# Patient Record
Sex: Male | Born: 1955
Health system: Southern US, Community
[De-identification: ages and names within clinical notes are randomized; demographics above are authoritative.]

## PROBLEM LIST (undated history)

## (undated) DIAGNOSIS — IMO0002 Reserved for concepts with insufficient information to code with codable children: Secondary | ICD-10-CM

## (undated) DIAGNOSIS — I10 Essential (primary) hypertension: Secondary | ICD-10-CM

## (undated) DIAGNOSIS — F419 Anxiety disorder, unspecified: Secondary | ICD-10-CM

## (undated) DIAGNOSIS — M722 Plantar fascial fibromatosis: Secondary | ICD-10-CM

## (undated) DIAGNOSIS — F329 Major depressive disorder, single episode, unspecified: Secondary | ICD-10-CM

## (undated) DIAGNOSIS — K219 Gastro-esophageal reflux disease without esophagitis: Secondary | ICD-10-CM

## (undated) DIAGNOSIS — G43909 Migraine, unspecified, not intractable, without status migrainosus: Secondary | ICD-10-CM

## (undated) DIAGNOSIS — E119 Type 2 diabetes mellitus without complications: Secondary | ICD-10-CM

## (undated) DIAGNOSIS — F32A Depression, unspecified: Secondary | ICD-10-CM

## (undated) DIAGNOSIS — Z9889 Other specified postprocedural states: Secondary | ICD-10-CM

## (undated) DIAGNOSIS — IMO0001 Reserved for inherently not codable concepts without codable children: Secondary | ICD-10-CM

## (undated) DIAGNOSIS — R011 Cardiac murmur, unspecified: Secondary | ICD-10-CM

## (undated) DIAGNOSIS — G8929 Other chronic pain: Secondary | ICD-10-CM

## (undated) DIAGNOSIS — M797 Fibromyalgia: Secondary | ICD-10-CM

## (undated) DIAGNOSIS — R112 Nausea with vomiting, unspecified: Secondary | ICD-10-CM

## (undated) HISTORY — DX: Migraine, unspecified, not intractable, without status migrainosus: G43.909

## (undated) HISTORY — DX: Fibromyalgia: M79.7

## (undated) HISTORY — DX: Reserved for inherently not codable concepts without codable children: IMO0001

## (undated) HISTORY — DX: Gastro-esophageal reflux disease without esophagitis: K21.9

## (undated) HISTORY — PX: CERVICAL FUSION: SHX112

## (undated) HISTORY — PX: OTHER SURGICAL HISTORY: SHX169

## (undated) HISTORY — DX: Other chronic pain: G89.29

## (undated) HISTORY — DX: Reserved for concepts with insufficient information to code with codable children: IMO0002

## (undated) HISTORY — PX: NOSE SURGERY: SHX723

## (undated) HISTORY — DX: Plantar fascial fibromatosis: M72.2

---

## 1997-12-28 ENCOUNTER — Ambulatory Visit (HOSPITAL_COMMUNITY): Admission: RE | Admit: 1997-12-28 | Discharge: 1997-12-28 | Payer: Self-pay | Admitting: Neurosurgery

## 2004-02-06 ENCOUNTER — Ambulatory Visit (HOSPITAL_COMMUNITY): Admission: RE | Admit: 2004-02-06 | Discharge: 2004-02-06 | Payer: Self-pay | Admitting: Family Medicine

## 2004-02-12 ENCOUNTER — Encounter (HOSPITAL_COMMUNITY): Admission: RE | Admit: 2004-02-12 | Discharge: 2004-03-13 | Payer: Self-pay | Admitting: Family Medicine

## 2004-03-17 ENCOUNTER — Encounter (HOSPITAL_COMMUNITY): Admission: RE | Admit: 2004-03-17 | Discharge: 2004-04-03 | Payer: Self-pay | Admitting: Family Medicine

## 2004-04-28 ENCOUNTER — Encounter (HOSPITAL_COMMUNITY): Admission: RE | Admit: 2004-04-28 | Discharge: 2004-05-28 | Payer: Self-pay | Admitting: Family Medicine

## 2004-10-29 ENCOUNTER — Ambulatory Visit: Payer: Self-pay | Admitting: Orthopedic Surgery

## 2004-11-20 ENCOUNTER — Ambulatory Visit: Payer: Self-pay | Admitting: Orthopedic Surgery

## 2004-11-20 ENCOUNTER — Ambulatory Visit (HOSPITAL_COMMUNITY): Admission: RE | Admit: 2004-11-20 | Discharge: 2004-11-20 | Payer: Self-pay | Admitting: Orthopedic Surgery

## 2004-11-23 ENCOUNTER — Ambulatory Visit: Payer: Self-pay | Admitting: Orthopedic Surgery

## 2004-11-24 ENCOUNTER — Encounter (HOSPITAL_COMMUNITY): Admission: RE | Admit: 2004-11-24 | Discharge: 2004-12-24 | Payer: Self-pay | Admitting: Orthopedic Surgery

## 2004-12-01 ENCOUNTER — Ambulatory Visit: Payer: Self-pay | Admitting: Orthopedic Surgery

## 2004-12-21 ENCOUNTER — Ambulatory Visit: Payer: Self-pay | Admitting: Orthopedic Surgery

## 2005-02-22 ENCOUNTER — Ambulatory Visit: Payer: Self-pay | Admitting: Orthopedic Surgery

## 2005-03-24 ENCOUNTER — Ambulatory Visit: Payer: Self-pay | Admitting: Orthopedic Surgery

## 2005-03-26 ENCOUNTER — Ambulatory Visit (HOSPITAL_COMMUNITY): Admission: RE | Admit: 2005-03-26 | Discharge: 2005-03-26 | Payer: Self-pay | Admitting: Orthopedic Surgery

## 2005-03-31 ENCOUNTER — Ambulatory Visit: Payer: Self-pay | Admitting: Orthopedic Surgery

## 2005-05-24 ENCOUNTER — Ambulatory Visit (HOSPITAL_COMMUNITY): Admission: RE | Admit: 2005-05-24 | Discharge: 2005-05-24 | Payer: Self-pay | Admitting: Neurosurgery

## 2007-07-28 ENCOUNTER — Ambulatory Visit (HOSPITAL_COMMUNITY): Admission: RE | Admit: 2007-07-28 | Discharge: 2007-07-28 | Payer: Self-pay | Admitting: Family Medicine

## 2010-11-20 NOTE — Op Note (Signed)
NAME:  Vernon Fisher, Vernon Fisher                ACCOUNT NO.:  [id number]   MEDICAL RECORD NO.:  [id number]          PATIENT TYPE:  AMB   LOCATION:  DAY                           FACILITY:  [facility]   PHYSICIAN:  Vickki Hearing, M.D.DATE OF BIRTH:  [birth year]   DATE OF PROCEDURE:  11/20/2004  DATE OF DISCHARGE:                                 OPERATIVE REPORT   PREOPERATIVE DIAGNOSIS:  Osteoarthritis, AC joint, impingement syndrome,  right shoulder.   POSTOPERATIVE DIAGNOSIS:  Osteoarthritis, AC joint, impingement syndrome,  right shoulder.   PROCEDURE:  Arthroscopy, right shoulder, surgical, subacromial decompression  then opened claviculectomy partial/Mumford procedure.   SURGEON:  Dr. Romeo Apple.   ASSISTANT:  1.  Melvin.   ANESTHETIC:  General.   FINDINGS:  Bursitis, large acromioclavicular spur.   PRIMARY INDICATION:  Persistent pain which failed nonoperative treatment.   The patient was identified preop holding area. His right shoulder was marked  as the surgical site and countersigned by the surgeon. He was given his  antibiotic. History and physical was updated. He was taken to the operating  room where general anesthesia was administered. In the lateral position with  axillary roll and padding on the lower extremity, his right arm was placed  in a shoulder holder, and 10 pounds of traction was placed on the arm. His  arm was prepped and draped using sterile technique. A time-out was taken and  completed.   From the posterior portal, arthroscopy was done. Scope was introduced  posteriorly. A diagnostic arthroscopy was performed. Motorized shaver was  placed intra-articularly to the clean up some mild fraying of his labrum.  The cuff was intact. Glenohumeral joint surfaces were normal. There were no  axillary loose bodies.   The scope was placed in the subacromial space and acromioplasty was  performed. Bursectomy was performed. Rotator cuff looked normal.   We opened the [facility]  joint using a transverse incision over the joint, divided  the subcu down to the clavicle fascia, split the fascia, preserved it, took  out 8 mm of bone with a saw, irrigated the wound, placed bone wax over the  end  of the bone and then closed the periosteum and subcu tissue with 2-0  Monocryl, placed a pain pump catheter in the subacromial space, activated  it, closed the wound with staples. Sterile dressings were applied. CryoCuff  was applied. The patient was extubated and taken to recovery room in stable  condition.      SEH/MEDQ  D:  11/20/2004  T:  11/21/2004  Job:  [id number]
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
## 2011-05-19 ENCOUNTER — Other Ambulatory Visit: Payer: Self-pay

## 2011-05-19 ENCOUNTER — Telehealth: Payer: Self-pay

## 2011-05-19 DIAGNOSIS — Z139 Encounter for screening, unspecified: Secondary | ICD-10-CM

## 2011-05-19 NOTE — Telephone Encounter (Signed)
Gastroenterology Pre-Procedure Form  Request Date: 05/18/2011      Requesting Physician: Dr. Simone Curia     PATIENT INFORMATION:  Vernon Fisher is a 55 y.o., male (DOB=[birth year]).  PROCEDURE: Procedure(s) requested: colonoscopy Procedure Reason: screening for colon cancer  PATIENT REVIEW QUESTIONS: The patient reports the following:   1. Diabetes Melitis: no 2. Joint replacements in the past 12 months: no 3. Major health problems in the past 3 months: no 4. Has an artificial valve or MVP:no 5. Has been advised in past to take antibiotics in advance of a procedure like teeth cleaning: no}    MEDICATIONS & ALLERGIES:    Patient reports the following regarding taking any blood thinners:   Plavix? no Aspirin?no Coumadin?  no  Patient confirms/reports the following medications:  Current Outpatient Prescriptions  Medication Sig Dispense Refill  . enalapril (VASOTEC) 10 MG tablet Take 10 mg by mouth daily.        . pantoprazole (PROTONIX) 40 MG tablet Take 40 mg by mouth daily.          Patient confirms/reports the following allergies:  No Known Allergies  Patient is appropriate to schedule for requested procedure(s): yes  AUTHORIZATION INFORMATION Primary Insurance:   ID #:   Group #:   Pre-Cert / Auth #:   Secondary Insurance:   ID #: ,  Group #:  Pre-Cert / Auth required:  Pre-Cert / Auth #:   No orders of the defined types were placed in this encounter.    SCHEDULE INFORMATION: Procedure has been scheduled as follows:  Date: 06/08/2011           Time: 11:15 AM  Location: [facility] Short Stay  This Gastroenterology Pre-Precedure Form is being routed to the following provider(s) for review: Jonette Eva, MD

## 2011-05-24 NOTE — Telephone Encounter (Signed)
MOVI PREP SPLIT DOSING. REGULAR BREAKFAST. CLEAR LIQUIDS AFTER 9 AM. 

## 2011-06-01 ENCOUNTER — Encounter (HOSPITAL_COMMUNITY): Payer: Self-pay | Admitting: Pharmacy Technician

## 2011-06-01 NOTE — Telephone Encounter (Signed)
Rx and instructions mailed.  

## 2011-06-07 MED ORDER — SODIUM CHLORIDE 0.45 % IV SOLN
Freq: Once | INTRAVENOUS | Status: AC
  Administered 2011-06-08: 11:00:00 via INTRAVENOUS

## 2011-06-08 ENCOUNTER — Ambulatory Visit (HOSPITAL_COMMUNITY)
Admission: RE | Admit: 2011-06-08 | Discharge: 2011-06-08 | Disposition: A | Discharge status: Home / Self Care | Payer: BC Managed Care – PPO | Source: Ambulatory Visit | Attending: Gastroenterology | Admitting: Gastroenterology

## 2011-06-08 ENCOUNTER — Encounter (HOSPITAL_COMMUNITY)
Admission: RE | Disposition: A | Discharge status: Home / Self Care | Payer: Self-pay | Source: Ambulatory Visit | Attending: Gastroenterology

## 2011-06-08 ENCOUNTER — Encounter (HOSPITAL_COMMUNITY): Payer: Self-pay | Admitting: *Deleted

## 2011-06-08 DIAGNOSIS — I10 Essential (primary) hypertension: Secondary | ICD-10-CM | POA: Insufficient documentation

## 2011-06-08 DIAGNOSIS — Z139 Encounter for screening, unspecified: Secondary | ICD-10-CM

## 2011-06-08 DIAGNOSIS — Z79899 Other long term (current) drug therapy: Secondary | ICD-10-CM | POA: Insufficient documentation

## 2011-06-08 DIAGNOSIS — Z1211 Encounter for screening for malignant neoplasm of colon: Secondary | ICD-10-CM

## 2011-06-08 HISTORY — PX: COLONOSCOPY: SHX5424

## 2011-06-08 HISTORY — DX: Essential (primary) hypertension: I10

## 2011-06-08 HISTORY — DX: Gastro-esophageal reflux disease without esophagitis: K21.9

## 2011-06-08 SURGERY — COLONOSCOPY
Anesthesia: Moderate Sedation

## 2011-06-08 MED ORDER — MIDAZOLAM HCL 5 MG/5ML IJ SOLN
INTRAMUSCULAR | Status: AC
  Filled 2011-06-08: qty 10

## 2011-06-08 MED ORDER — STERILE WATER FOR IRRIGATION IR SOLN
Status: DC | PRN
  Administered 2011-06-08: 11:00:00

## 2011-06-08 MED ORDER — MEPERIDINE HCL 100 MG/ML IJ SOLN
INTRAMUSCULAR | Status: AC
  Filled 2011-06-08: qty 2

## 2011-06-08 MED ORDER — MIDAZOLAM HCL 5 MG/5ML IJ SOLN
INTRAMUSCULAR | Status: DC | PRN
  Administered 2011-06-08 (×2): 2 mg via INTRAVENOUS

## 2011-06-08 MED ORDER — MEPERIDINE HCL 100 MG/ML IJ SOLN
INTRAMUSCULAR | Status: DC | PRN
  Administered 2011-06-08: 50 mg via INTRAVENOUS
  Administered 2011-06-08: 25 mg via INTRAVENOUS

## 2011-06-08 NOTE — H&P (Signed)
  Primary Care Physician:  Harlow Asa, MD, MD Primary Gastroenterologist:  Dr. [name]  Pre-Procedure History & Physical: HPI:  Vernon Fisher is a 55 y.o. male here for AVERAGE RISK SCREENING.  Past Medical History  Diagnosis Date  . GERD (gastroesophageal reflux disease)   . Hypertension     Past Surgical History  Procedure Date  . Bilateral carpal tunnel release   . Right rotator cuff repair   . Nose surgery     Prior to Admission medications   Medication Sig Start Date End Date Taking? Authorizing Provider  enalapril (VASOTEC) 10 MG tablet Take 10 mg by mouth daily.     Yes Historical Provider, MD  pantoprazole (PROTONIX) 40 MG tablet Take 40 mg by mouth daily.     Yes Historical Provider, MD    Allergies as of 05/19/2011  . (No Known Allergies)    Family History  Problem Relation Age of Onset  . Colon cancer Neg Hx     History   Social History  . Marital Status: Married    Spouse Name: N/A    Number of Children: N/A  . Years of Education: N/A   Occupational History  . Not on file.   Social History Main Topics  . Smoking status: Former Smoker -- 1.0 packs/day for 10 years  . Smokeless tobacco: Not on file  . Alcohol Use: No  . Drug Use: No  . Sexually Active:    Other Topics Concern  . Not on file   Social History Narrative  . No narrative on file    Review of Systems: See HPI, otherwise negative ROS   Physical Exam: BP 130/81  Pulse 66  Temp(Src) 98.5 F (36.9 C) (Oral)  Resp 17  Ht 5\' 11"  (1.803 m)  Wt 218 lb (98.884 kg)  BMI 30.40 kg/m2  SpO2 96% General:   Alert,  pleasant and cooperative in NAD Head:  Normocephalic and atraumatic. Neck:  Supple; no masses or thyromegaly. Lungs:  Clear throughout to auscultation.    Heart:  Regular rate and rhythm. Abdomen:  Soft, nontender and nondistended. Normal bowel sounds, without guarding, and without rebound.   Neurologic:  Alert and  oriented x4;  grossly normal  neurologically.  Impression/Plan:    AVERAGE RISK  PLAN: TCS TODAY

## 2011-06-09 ENCOUNTER — Other Ambulatory Visit: Payer: Self-pay | Admitting: Family Medicine

## 2011-06-09 DIAGNOSIS — M549 Dorsalgia, unspecified: Secondary | ICD-10-CM

## 2011-06-14 ENCOUNTER — Ambulatory Visit (HOSPITAL_COMMUNITY)
Admission: RE | Admit: 2011-06-14 | Discharge: 2011-06-14 | Disposition: A | Discharge status: Home / Self Care | Payer: BC Managed Care – PPO | Source: Ambulatory Visit | Attending: Family Medicine | Admitting: Family Medicine

## 2011-06-14 ENCOUNTER — Other Ambulatory Visit: Payer: Self-pay | Admitting: Family Medicine

## 2011-06-14 DIAGNOSIS — M79609 Pain in unspecified limb: Secondary | ICD-10-CM | POA: Insufficient documentation

## 2011-06-14 DIAGNOSIS — M5126 Other intervertebral disc displacement, lumbar region: Secondary | ICD-10-CM | POA: Insufficient documentation

## 2011-06-14 DIAGNOSIS — M549 Dorsalgia, unspecified: Secondary | ICD-10-CM

## 2011-06-14 DIAGNOSIS — Z1389 Encounter for screening for other disorder: Secondary | ICD-10-CM

## 2011-06-14 DIAGNOSIS — M545 Low back pain, unspecified: Secondary | ICD-10-CM | POA: Insufficient documentation

## 2011-06-14 DIAGNOSIS — M5124 Other intervertebral disc displacement, thoracic region: Secondary | ICD-10-CM | POA: Insufficient documentation

## 2011-06-17 ENCOUNTER — Encounter (HOSPITAL_COMMUNITY): Payer: Self-pay | Admitting: Gastroenterology

## 2011-11-04 ENCOUNTER — Other Ambulatory Visit: Payer: Self-pay | Admitting: Family Medicine

## 2011-11-04 ENCOUNTER — Ambulatory Visit (HOSPITAL_COMMUNITY)
Admission: RE | Admit: 2011-11-04 | Discharge: 2011-11-04 | Disposition: A | Discharge status: Home / Self Care | Payer: BC Managed Care – PPO | Source: Ambulatory Visit | Attending: Family Medicine | Admitting: Family Medicine

## 2011-11-04 DIAGNOSIS — M79641 Pain in right hand: Secondary | ICD-10-CM

## 2011-11-04 DIAGNOSIS — M79609 Pain in unspecified limb: Secondary | ICD-10-CM | POA: Insufficient documentation

## 2012-06-29 ENCOUNTER — Emergency Department (HOSPITAL_COMMUNITY)
Admission: EM | Admit: 2012-06-29 | Discharge: 2012-06-29 | Disposition: A | Discharge status: Home / Self Care | Payer: BC Managed Care – PPO | Attending: Emergency Medicine | Admitting: Emergency Medicine

## 2012-06-29 ENCOUNTER — Emergency Department (HOSPITAL_COMMUNITY): Payer: BC Managed Care – PPO

## 2012-06-29 ENCOUNTER — Encounter (HOSPITAL_COMMUNITY): Payer: Self-pay

## 2012-06-29 DIAGNOSIS — E119 Type 2 diabetes mellitus without complications: Secondary | ICD-10-CM | POA: Insufficient documentation

## 2012-06-29 DIAGNOSIS — Z87891 Personal history of nicotine dependence: Secondary | ICD-10-CM | POA: Insufficient documentation

## 2012-06-29 DIAGNOSIS — R0602 Shortness of breath: Secondary | ICD-10-CM | POA: Insufficient documentation

## 2012-06-29 DIAGNOSIS — R5381 Other malaise: Secondary | ICD-10-CM | POA: Insufficient documentation

## 2012-06-29 DIAGNOSIS — I10 Essential (primary) hypertension: Secondary | ICD-10-CM | POA: Insufficient documentation

## 2012-06-29 DIAGNOSIS — Z79899 Other long term (current) drug therapy: Secondary | ICD-10-CM | POA: Insufficient documentation

## 2012-06-29 DIAGNOSIS — Z8719 Personal history of other diseases of the digestive system: Secondary | ICD-10-CM | POA: Insufficient documentation

## 2012-06-29 DIAGNOSIS — J189 Pneumonia, unspecified organism: Secondary | ICD-10-CM

## 2012-06-29 DIAGNOSIS — R0789 Other chest pain: Secondary | ICD-10-CM

## 2012-06-29 DIAGNOSIS — R071 Chest pain on breathing: Secondary | ICD-10-CM | POA: Insufficient documentation

## 2012-06-29 HISTORY — DX: Type 2 diabetes mellitus without complications: E11.9

## 2012-06-29 LAB — HEPATIC FUNCTION PANEL
ALT: 50 U/L (ref 0–53)
AST: 28 U/L (ref 0–37)
Albumin: 4 g/dL (ref 3.5–5.2)
Alkaline Phosphatase: 99 U/L (ref 39–117)
Bilirubin, Direct: <0.1 mg/dL (ref 0.0–0.3)
Total Bilirubin: 0.4 mg/dL (ref 0.3–1.2)
Total Protein: 7 g/dL (ref 6.0–8.3)

## 2012-06-29 LAB — BASIC METABOLIC PANEL
Chloride: 100 mEq/L (ref 96–112)
Creatinine, Ser: 0.72 mg/dL (ref 0.50–1.35)
GFR calc Af Amer: >90 mL/min (ref >90)
Potassium: 3.9 mEq/L (ref 3.5–5.1)
Sodium: 137 mEq/L (ref 135–145)

## 2012-06-29 LAB — CBC
Platelets: 161 10*3/uL (ref 150–400)
RDW: 12.8 % (ref 11.5–15.5)
WBC: 7.5 10*3/uL (ref 4.0–10.5)

## 2012-06-29 LAB — PRO B NATRIURETIC PEPTIDE: Pro B Natriuretic peptide (BNP): 5.3 pg/mL (ref 0–125)

## 2012-06-29 LAB — TROPONIN I
Troponin I: <0.3 ng/mL
Troponin I: <0.3 ng/mL

## 2012-06-29 MED ORDER — AZITHROMYCIN 250 MG PO TABS: ORAL_TABLET | ORAL | Status: DC

## 2012-06-29 MED ORDER — OXYCODONE-ACETAMINOPHEN 5-325 MG PO TABS
1.0000 | ORAL_TABLET | Freq: Once | ORAL | Status: AC
  Administered 2012-06-29: 1 via ORAL
  Filled 2012-06-29: qty 1

## 2012-06-29 MED ORDER — HYDROCODONE-ACETAMINOPHEN 5-325 MG PO TABS
1.0000 | ORAL_TABLET | Freq: Once | ORAL | Status: AC
  Administered 2012-06-29: 1 via ORAL
  Filled 2012-06-29: qty 1

## 2012-06-29 NOTE — ED Notes (Signed)
C/o central cp that is dull pressure. Pain worse with exertion. Dizziness and nausea as well.

## 2012-06-29 NOTE — ED Notes (Signed)
Upon attempting discharge pt. Pt raised head and became dizzy, I reattached monitor and noted no change in rate/rhythm. Slowly sat patient up and he collapsed back onto stretcher saying he was going to "pass out", BP and HR elevated slightly no change in heart rhythm. Dr.Zammitt notified and re-eval pt.  Pt non-orthostatic. C/o now headache. Medicated as noted. For CT head now.

## 2012-06-29 NOTE — ED Provider Notes (Addendum)
History    This chart was scribed for Benny Lennert, MD, MD by Smitty Pluck, ED Scribe. The patient was seen in room [facility]19 and the patient's care was started at 11:54 AM.   CSN: [id number]  Arrival date & time 06/29/12  1115    Chief Complaint  Patient presents with  . Chest Pain    (Consider location/radiation/quality/duration/timing/severity/associated sxs/prior treatment) Patient is a 56 y.o. male presenting with chest pain. The history is provided by the patient. No language interpreter was used.  Chest Pain The chest pain began 3 - 5 days ago. Duration of episode(s) is 4 minutes. Chest pain occurs intermittently. The chest pain is unchanged. The pain is associated with exertion. The severity of the pain is moderate. The pain does not radiate. Primary symptoms include fatigue and shortness of breath. Pertinent negatives for primary symptoms include no cough and no abdominal pain.  The shortness of breath developed with exertion.  Pertinent negatives for past medical history include no seizures.    Vernon Fisher is a 56 y.o. male with hx of osteoarthritis, DM, GERD and HTN who presents to the Emergency Department complaining of intermittent, moderate left chest pain onset 6 days ago. Chest pain is aggravated by exertion. He states the episodes of chest last 3-4 minutes. Pt mentions he has diet controlled DM. His last physical exam was 1 year ago. He states he has SOB, fatigue and weird taste in mouth. SOB is aggravated by walking long distances and exertion.   Pt's father passed at 63 from complications from cancer.   Past Medical History  Diagnosis Date  . GERD (gastroesophageal reflux disease)   . Hypertension   . Diabetes mellitus without complication     Past Surgical History  Procedure Date  . Bilateral carpal tunnel release   . Right rotator cuff repair   . Nose surgery   . Colonoscopy 06/08/2011    Procedure: COLONOSCOPY;  Surgeon: [name], MD;  Location: [facility]  ENDO SUITE;  Service: Endoscopy;  Laterality: N/A;  11:15 AM    Family History  Problem Relation Age of Onset  . Colon cancer Neg Hx     History  Substance Use Topics  . Smoking status: Former Smoker -- 1.0 packs/day for 10 years  . Smokeless tobacco: Not on file  . Alcohol Use: No      Review of Systems  Constitutional: Positive for fatigue.  HENT: Negative for congestion, sinus pressure and ear discharge.   Eyes: Negative for discharge.  Respiratory: Positive for shortness of breath. Negative for cough.   Cardiovascular: Positive for chest pain.  Gastrointestinal: Negative for abdominal pain and diarrhea.  Genitourinary: Negative for frequency and hematuria.  Musculoskeletal: Negative for back pain.  Skin: Negative for rash.  Neurological: Negative for seizures and headaches.  Hematological: Negative.   Psychiatric/Behavioral: Negative for hallucinations.  All other systems reviewed and are negative.    Allergies  Latex  Home Medications   Current Outpatient Rx  Name  Route  Sig  Dispense  Refill  . ENALAPRIL MALEATE 10 MG PO TABS   Oral   Take 10 mg by mouth daily.           Marland Kitchen PANTOPRAZOLE SODIUM 40 MG PO TBEC   Oral   Take 40 mg by mouth daily.             BP 126/74  Pulse 61  Temp 97.5 F (36.4 C) (Oral)  Resp 20  Wt 208  lb (94.348 kg)  SpO2 97%  Physical Exam  Nursing note and vitals reviewed. Constitutional: He is oriented to person, place, and time. He appears well-developed.  HENT:  Head: Normocephalic and atraumatic.  Eyes: Conjunctivae normal and EOM are normal. No scleral icterus.  Neck: Neck supple. No thyromegaly present.  Cardiovascular: Normal rate and regular rhythm.  Exam reveals no gallop and no friction rub.   No murmur heard. Pulmonary/Chest: No stridor. He has no wheezes. He has no rales. He exhibits no tenderness.  Abdominal: He exhibits no distension. There is no tenderness. There is no rebound.  Musculoskeletal: Normal  range of motion. He exhibits no edema.  Lymphadenopathy:    He has no cervical adenopathy.  Neurological: He is oriented to person, place, and time. Coordination normal.  Skin: No rash noted. No erythema.  Psychiatric: He has a normal mood and affect. His behavior is normal.    ED Course  Procedures (including critical care time) DIAGNOSTIC STUDIES: Oxygen Saturation is 97% on room air, normal by my interpretation.    COORDINATION OF CARE: 11:59 AM Discussed ED treatment with pt     Labs Reviewed - No data to display No results found.   No diagnosis found.  .edeg  Date: 06/29/2012  Rate: 60  Rhythm: normal sinus rhythm  QRS Axis: normal  Intervals: normal  ST/T Wave abnormalities: normal  Conduction Disutrbances:none  Narrative Interpretation:   Old EKG Reviewed: none available  Pt with headache at discharge will get ct head and tx with percocet  MDM  Chest pain evaluation negative.  Will refer to cardiology and put pt on asa.    The chart was scribed for me under my direct supervision.  I personally performed the history, physical, and medical decision making and all procedures in the evaluation of this patient.Benny Lennert, MD 06/29/12 1524  Benny Lennert, MD 06/29/12 [phone]

## 2012-06-29 NOTE — ED Notes (Signed)
Able to stand to urinate without difficulty.

## 2012-06-29 NOTE — ED Provider Notes (Signed)
Pt received at change of shift.  56yo M, c/o constant mid-sternal chest "pressure" for the past week.  States the discomfort waxes and wanes but never has truly gone away completely for the past 5 to 6 days.  States his symptoms worsen "when I'm worried about something" and improve if he "calms myself down."  Has been assoc with runny/stuffy nose, chills, generalized fatigue, and mild SOB. Also c/o acute flair of his long standing chronic migraine headache today.  States he feels "much better" after percocet and wants to go home now.  VSS, not orthostatic, resps easy, has stood at bedside steady.  Doubt ACS as cause for pain given constant pain for the past 5-6 days with normal EKG and troponin negative x2.  Well's low risk.  CXR with possible early infiltrate; will treat.  Dx and testing d/w pt and family.  Questions answered.  Verb understanding, agreeable to d/c home with outpt f/u.   Laray Anger, DO 06/30/12 1900

## 2012-06-29 NOTE — ED Notes (Signed)
Much improved, has been able to sit up on side of bed for past w/o distress. States headache is down to 5/10. Feels comfortable going home

## 2012-07-07 ENCOUNTER — Encounter: Payer: Self-pay | Admitting: *Deleted

## 2012-07-07 ENCOUNTER — Ambulatory Visit (INDEPENDENT_AMBULATORY_CARE_PROVIDER_SITE_OTHER): Payer: BC Managed Care – PPO | Admitting: Internal Medicine

## 2012-07-07 ENCOUNTER — Encounter: Payer: Self-pay | Admitting: Internal Medicine

## 2012-07-07 VITALS — BP 118/82 | HR 78 | Ht 71.0 in | Wt 209.0 lb

## 2012-07-07 DIAGNOSIS — R079 Chest pain, unspecified: Secondary | ICD-10-CM

## 2012-07-07 NOTE — Progress Notes (Signed)
HPI Patient is a 57 yo referred for evaluation of CP Patient seen in ER on 12/26 with complaints of CP  COnstant for 1 week.  Wax/wane.  Did not go away.  Worse with mental stress.   SInce seen still having pain  Never goes away.  Occasionally pleuritic  Not positional No f/C COugh just started  Yellow green mucus.   Not much  Hx reflux  No real complaints now.  Works  ALl physical  Last 6 months has slowed down  Tired.  Fatigued. 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 Latex Rash    Reaction : mouth sores    Current Outpatient Prescriptions  Medication Sig Dispense Refill  . CELEBREX 100 MG capsule Take 100 mg by mouth daily.       . enalapril (VASOTEC) 10 MG tablet Take 10 mg by mouth daily.        . Hydrocodone-Acetaminophen 7.5-300 MG TABS Take 1 tablet by mouth at bedtime as needed. For pain        Past Medical History  Diagnosis Date  . GERD (gastroesophageal reflux disease)   . Hypertension   . Diabetes mellitus without complication     Past Surgical History  Procedure Date  . Bilateral carpal tunnel release   . Right rotator cuff repair   . Nose surgery   . Colonoscopy 06/08/2011    Procedure: COLONOSCOPY;  Surgeon: [name], MD;  Location: [facility] END[facility];  Service: Endoscopy;  Laterality: N/A;  11:15 AM    Family History  Problem Relation Age of Onset  . Colon cancer Neg Hx     History   Social History  . Marital Status: Married    Spouse Name: N/A    Number of Children: N/A  . Years of Education: N/A   Occupational History  . Not on file.   Social History Main Topics  . Smoking status: Former Smoker -- 1.0 packs/day for 10 years  . Smokeless tobacco: Not on file  . Alcohol Use: No  . Drug Use: No  . Sexually Active:    Other Topics Concern  . Not on file   Social History Narrative  . No narrative on file    Review of Systems:  All systems reviewed.  They are negative to the above problem except  as previously stated.  Vital Signs: BP 118/82  Pulse 78  Ht 5\' 11"  (1.803 m)  Wt 209 lb (94.802 kg)  BMI 29.15 kg/m2  SpO2 98%  Physical Exam  HEENT:  Normocephalic, atraumatic. EOMI, PERRLA.  Neck: JVP is normal.  No bruits.  Lungs: clear to auscultation. No rales no wheezes.  Heart: Regular rate and rhythm. Normal S1, S2. No S3.   No significant murmurs. PMI not displaced.  Abdomen:  Supple, nontender. Normal bowel sounds. No masses. No hepatomegaly.  Extremities:   Good distal pulses throughout. No lower extremity edema.  Musculoskeletal :moving all extremities.  Neuro:   alert and oriented x3.  CN II-XII grossly intact.  EKG  12/26  NSR  60 bpm Assessment and Plan:  1.  CP   Patient presents with CP  He does have cardiac risk factors for CAD  I am not convinced that pain represents angina. WIll set up for exercise myoview to evaluate.  2.  HL  He is not on statin.  Will need to evaluate

## 2012-07-07 NOTE — Patient Instructions (Addendum)
Your physician recommends that you schedule a follow-up appointment in: TO BE DETERMINED BY TEST RESULTS  Your physician has requested that you have en exercise stress myoview. For further information please visit [email or web address] Please follow instruction sheet, as given.

## 2012-07-14 ENCOUNTER — Encounter (HOSPITAL_COMMUNITY)
Admission: RE | Admit: 2012-07-14 | Discharge: 2012-07-14 | Disposition: A | Discharge status: Home / Self Care | Payer: BC Managed Care – PPO | Source: Ambulatory Visit | Attending: Internal Medicine | Admitting: Internal Medicine

## 2012-07-14 ENCOUNTER — Ambulatory Visit (HOSPITAL_COMMUNITY)
Admission: RE | Admit: 2012-07-14 | Discharge: 2012-07-14 | Disposition: A | Discharge status: Home / Self Care | Payer: BC Managed Care – PPO | Source: Ambulatory Visit | Attending: Cardiology | Admitting: Cardiology

## 2012-07-14 ENCOUNTER — Encounter (HOSPITAL_COMMUNITY): Payer: Self-pay

## 2012-07-14 ENCOUNTER — Encounter (HOSPITAL_COMMUNITY): Payer: Self-pay | Admitting: Cardiology

## 2012-07-14 DIAGNOSIS — R079 Chest pain, unspecified: Secondary | ICD-10-CM | POA: Insufficient documentation

## 2012-07-14 MED ORDER — SODIUM CHLORIDE 0.9 % IJ SOLN
INTRAMUSCULAR | Status: AC
  Administered 2012-07-14: 10 mL via INTRAVENOUS
  Filled 2012-07-14: qty 10

## 2012-07-14 MED ORDER — REGADENOSON 0.4 MG/5ML IV SOLN
INTRAVENOUS | Status: AC
  Administered 2012-07-14: 0.4 mg via INTRAVENOUS
  Filled 2012-07-14: qty 5

## 2012-07-14 MED ORDER — TECHNETIUM TC 99M SESTAMIBI - CARDIOLITE
10.0000 | Freq: Once | INTRAVENOUS | Status: AC | PRN
  Administered 2012-07-14: 09:00:00 11 via INTRAVENOUS

## 2012-07-14 MED ORDER — TECHNETIUM TC 99M SESTAMIBI - CARDIOLITE
30.0000 | Freq: Once | INTRAVENOUS | Status: AC | PRN
  Administered 2012-07-14: 29 via INTRAVENOUS

## 2012-07-14 NOTE — Progress Notes (Addendum)
Stress Lab Nurses Notes - Mujtaba Bollig 07/14/2012 Reason for doing test: Chest Pain Type of test: Lexiscan Cardiolite / changed test unable to reach THR while on TM. Nurse performing test: Parke Poisson, RN Nuclear Medicine Tech: Lou Cal Echo Tech: Not Applicable MD performing test: R. Rothbart & Joni Reining NP Family MD: Dr. Lubertha South Test explained and consent signed: yes IV started: 22g jelco, Saline lock flushed, No redness or edema and Saline lock started in radiology Symptoms :Hip pain , left side Treatment/Intervention: None Reason test stopped: protocol completed After recovery IV was: Discontinued via X-ray tech and No redness or edema Patient to return to Nuc. Med at :11:45 Patient discharged: Home Patient's Condition upon discharge was: stable Comments: During test peak BP 150/79 & HR 130.  Recovery BP 127/75 & HR 83.  Symptoms resolved in recovery. Erskine Speed T

## 2012-07-17 ENCOUNTER — Encounter: Payer: Self-pay | Admitting: Adult Health

## 2012-07-17 ENCOUNTER — Ambulatory Visit (INDEPENDENT_AMBULATORY_CARE_PROVIDER_SITE_OTHER): Payer: BC Managed Care – PPO | Admitting: Adult Health

## 2012-07-17 VITALS — BP 118/74 | HR 81 | Ht 71.0 in | Wt 205.8 lb

## 2012-07-17 DIAGNOSIS — M1990 Unspecified osteoarthritis, unspecified site: Secondary | ICD-10-CM | POA: Insufficient documentation

## 2012-07-17 DIAGNOSIS — M129 Arthropathy, unspecified: Secondary | ICD-10-CM

## 2012-07-17 DIAGNOSIS — I10 Essential (primary) hypertension: Secondary | ICD-10-CM | POA: Insufficient documentation

## 2012-07-17 DIAGNOSIS — K219 Gastro-esophageal reflux disease without esophagitis: Secondary | ICD-10-CM | POA: Insufficient documentation

## 2012-07-17 DIAGNOSIS — R079 Chest pain, unspecified: Secondary | ICD-10-CM

## 2012-07-17 NOTE — Patient Instructions (Signed)
Your physician recommends that you schedule a follow-up appointment in: PRN 

## 2012-07-17 NOTE — Assessment & Plan Note (Signed)
Stress completed states " Probably negative pharmacologic and exercise stress nuclear myocardial study revealing good exercise tolerance, a negative stress EKG and normal LV size and function. There were mild defects as described, probably related to soft tissue attenuation but no convincing evidence for ischemia or infarction.  This has been explained to the patient and reassurance is given. He will need to follow up with PCP for other etiology for chest discomfort with recommendations to see GI specialist for possible GB disease and/or GERD. We will see him on prn basis.

## 2012-07-17 NOTE — Assessment & Plan Note (Signed)
Blood pressure is well controlled presently. No changes in his medication regimen.

## 2012-07-17 NOTE — Progress Notes (Signed)
   HPI: Vernon Fisher is a 57 y/o patient of Dr. Dietrich Pates we are seeing for ongoing assessment and treatment of chest discomfort with history of hypertension and arthritis. He was sen for stress test by Dr. Tenny Craw and is here to discuss the results. He is still having some chest discomfort without associated dyspnea or diaphoresis. He also complains of some GERD symptoms and bad taste in his mouth, "like baking soda has coated my tongue."  Allergies  Allergen Reactions  . Latex Rash    Reaction : mouth sores    Current Outpatient Prescriptions  Medication Sig Dispense Refill  . CELEBREX 100 MG capsule Take 100 mg by mouth daily.       . enalapril (VASOTEC) 10 MG tablet Take 10 mg by mouth daily.        . Hydrocodone-Acetaminophen 7.5-300 MG TABS Take 1 tablet by mouth at bedtime as needed. For pain        Past Medical History  Diagnosis Date  . GERD (gastroesophageal reflux disease)   . Hypertension   . Diabetes mellitus without complication     Past Surgical History  Procedure Date  . Bilateral carpal tunnel release   . Right rotator cuff repair   . Nose surgery   . Colonoscopy 06/08/2011    Procedure: COLONOSCOPY;  Surgeon: [name], MD;  Location: [facility];  Service: Endoscopy;  Laterality: N/A;  11:15 AM    XBJ:YNWGNF of systems complete and found to be negative unless listed above  PHYSICAL EXAM BP 118/74  Pulse 81  Ht 5\' 11"  (1.803 m)  Wt 205 lb 12.8 oz (93.35 kg)  BMI 28.70 kg/m2  SpO2 96%  General: Well developed, well nourished, in no acute distress Head: Eyes PERRLA, No xanthomas.   Normal cephalic and atramatic  Lungs: Clear bilaterally to auscultation and percussion. Heart: HRRR S1 S2, without MRG.  Pulses are 2+ & equal.            No carotid bruit. No JVD.  No abdominal bruits. No femoral bruits. Abdomen: Bowel sounds are positive, abdomen soft and non-tender without masses or                  Hernia's noted. Msk:  Back normal, normal gait. Normal  strength and tone for age. Extremities: No clubbing, cyanosis or edema.  DP +1 Neuro: Alert and oriented X 3. Psych:  Good affect, responds appropriately    ASSESSMENT AND PLAN

## 2012-07-17 NOTE — Progress Notes (Deleted)
Name: Vernon Fisher    DOB: [birth year]  Age: 57 y.o.  MR#: [id number]       PCP:  Harlow Asa, MD      Insurance: @PAYORNAME @   CC:   No chief complaint on file.   VS BP 118/74  Pulse 81  Ht 5\' 11"  (1.803 m)  Wt 205 lb 12.8 oz (93.35 kg)  BMI 28.70 kg/m2  SpO2 96%  Weights Current Weight  07/17/12 205 lb 12.8 oz (93.35 kg)  07/07/12 209 lb (94.802 kg)  06/29/12 208 lb (94.348 kg)    Blood Pressure  BP Readings from Last 3 Encounters:  07/17/12 118/74  07/07/12 118/82  06/29/12 114/85     Admit date:  (Not on file) Last encounter with RMR:  Visit date not found   Allergy Allergies  Allergen Reactions  . Latex Rash    Reaction : mouth sores    Current Outpatient Prescriptions  Medication Sig Dispense Refill  . CELEBREX 100 MG capsule Take 100 mg by mouth daily.       . enalapril (VASOTEC) 10 MG tablet Take 10 mg by mouth daily.        . Hydrocodone-Acetaminophen 7.5-300 MG TABS Take 1 tablet by mouth at bedtime as needed. For pain        Discontinued Meds:   There are no discontinued medications.  There is no problem list on file for this patient.   LABS Admission on 06/29/2012, Discharged on 06/29/2012  Component Date Value  . WBC 06/29/2012 7.5   . RBC 06/29/2012 5.44   . Hemoglobin 06/29/2012 16.4   . HCT 06/29/2012 47.4   . MCV 06/29/2012 87.1   . [facility] 06/29/2012 30.1   . MCHC 06/29/2012 34.6   . RDW 06/29/2012 12.8   . Platelets 06/29/2012 161   . Sodium 06/29/2012 137   . Potassium 06/29/2012 3.9   . Chloride 06/29/2012 100   . CO2 06/29/2012 31   . Glucose, Bld 06/29/2012 124*  . BUN 06/29/2012 9   . Creatinine, Ser 06/29/2012 0.72   . Calcium 06/29/2012 9.5   . GFR calc non Af Amer 06/29/2012 >90   . GFR calc Af Amer 06/29/2012 >90   . Troponin I 06/29/2012 <0.30   . Total Protein 06/29/2012 7.0   . Albumin 06/29/2012 4.0   . AST 06/29/2012 28   . ALT 06/29/2012 50   . Alkaline Phosphatase 06/29/2012 99   . Total Bilirubin 06/29/2012  0.4   . Bilirubin, Direct 06/29/2012 <0.1   . Indirect Bilirubin 06/29/2012 NOT CALCULATED   . Pro B Natriuretic peptid* 06/29/2012 5.3   . Troponin I 06/29/2012 <0.30      Results for this Opt Visit:     Results for orders placed during the hospital encounter of 06/29/12  CBC      Component Value Range   WBC 7.5  4.0 - 10.5 K/uL   RBC 5.44  4.22 - 5.81 MIL/uL   Hemoglobin 16.4  13.0 - 17.0 g/dL   HCT 84.1  32.4 - 40.1 %   MCV 87.1  78.0 - 100.0 fL   MCH 30.1  26.0 - 34.0 pg   MCHC 34.6  30.0 - 36.0 g/dL   RDW 02.7  25.3 - 66.4 %   Platelets 161  150 - 400 K/uL  BASIC METABOLIC PANEL      Component Value Range   Sodium 137  135 - 145 mEq/L   Potassium 3.9  3.5 -  5.1 mEq/L   Chloride 100  96 - 112 mEq/L   CO2 31  19 - 32 mEq/L   Glucose, Bld 124 (*) 70 - 99 mg/dL   BUN 9  6 - 23 mg/dL   Creatinine, Ser 1.61  0.50 - 1.35 mg/dL   Calcium 9.5  8.4 - 09.6 mg/dL   GFR calc non Af Amer >90  >90 mL/min   GFR calc Af Amer >90  >90 mL/min  TROPONIN I      Component Value Range   Troponin I <0.30  <0.30 ng/mL  HEPATIC FUNCTION PANEL      Component Value Range   Total Protein 7.0  6.0 - 8.3 g/dL   Albumin 4.0  3.5 - 5.2 g/dL   AST 28  0 - 37 U/L   ALT 50  0 - 53 U/L   Alkaline Phosphatase 99  39 - 117 U/L   Total Bilirubin 0.4  0.3 - 1.2 mg/dL   Bilirubin, Direct <0.4  0.0 - 0.3 mg/dL   Indirect Bilirubin NOT CALCULATED  0.3 - 0.9 mg/dL  PRO B NATRIURETIC PEPTIDE      Component Value Range   Pro B Natriuretic peptide (BNP) 5.3  0 - 125 pg/mL  TROPONIN I      Component Value Range   Troponin I <0.30  <0.30 ng/mL    EKG Orders placed during the hospital encounter of 06/29/12  . ED EKG  . ED EKG  . EKG 12-LEAD  . EKG 12-LEAD  . ED EKG  . ED EKG  . EKG     Prior Assessment and Plan    Imaging: Ct Head Wo Contrast  06/29/2012  *RADIOLOGY REPORT*  Clinical Data: Headache and chest pain  CT HEAD WITHOUT CONTRAST  Technique:  Contiguous axial images were obtained  from the base of the skull through the vertex without contrast.  Comparison: None  Findings: Ventricle size is normal.  No acute infarct.  Negative for hemorrhage or mass.  No significant chronic ischemia. Calvarium is intact.  IMPRESSION: Negative   Original Report Authenticated By: Janeece Riggers, M.D.    Nm Myocar Single W/spect W/wall Motion And Ef  07/16/2012  Ordering Physician: Dietrich Pates  Reading Physician: [place] Bing  Clinical Data: 57 year old gentleman with chest pain.  NUCLEAR MEDICINE COMBINED EXERCISE AND PHARMACOLOGIC STRESS MYOVIEW STUDY WITH SPECT AND LEFT VENTRICULAR EJECTION FRACTION  Radionuclide Data: One-day rest/stress protocol performed with 10/30 mCi of Tc-23m Myoview.  Stress Data: Treadmill exercise performed to a workload of 10.4 mets and a heart rate of 131, 78% of age predicted maximum. Exercise was discontinued due to  left hip pain, and Regadenoson administered. An appropriate increase in blood pressure was noted during exercise.  No arrhythmias were identified.  EKG: Normal sinus rhythm; right ventricular conduction delay; slightly delayed R-wave progression; otherwise normal.  No significant change with submaximal stress.  Scintigraphic Data: Acquisition notable for mild to moderate diaphragmatic attenuation.  Left ventricular size was at the upper limit of normal.  On tomographic images reconstructed in standard planes, there was a small and mild defect involving the anterior wall and extending apically as well as a small mild basilar inferior defect.  No reversibility was noted in either of these regions.  The gated reconstruction demonstrated normal regional and global LV systolic function as well as normal systolic accentuation of activity throughout.  IMPRESSION: Probably negative pharmacologic and exercise stress nuclear myocardial study revealing good exercise tolerance, a negative stress  EKG, normal left ventricular size and normal left ventricular systolic function.   By scintigraphic imaging, there were mild defects as described, probably related to soft tissue attenuation but no convincing evidence for ischemia or infarction. Other findings as noted.   Original Report Authenticated By: [place] Bing    Dg Chest Port 1 View  06/29/2012  *RADIOLOGY REPORT*  Clinical Data: Chest pain.  PORTABLE CHEST - 1 VIEW  Comparison: None.  Findings: The heart size is normal.  Ill-defined right lower lobe airspace disease may represent early infection.  The upper lung fields are clear. The lung volumes are low.  The visualized soft tissues and bony thorax are unremarkable.  IMPRESSION:  1.  Subtle right lower lobe airspace disease may represent early infection. 2.  Low lung volumes.   Original Report Authenticated By: Marin Roberts, M.D.      [facility] Calculation: Score not calculated. Missing: Total Cholesterol, HDL

## 2012-07-21 ENCOUNTER — Other Ambulatory Visit: Payer: Self-pay | Admitting: Family Medicine

## 2012-07-21 DIAGNOSIS — R109 Unspecified abdominal pain: Secondary | ICD-10-CM

## 2012-07-26 ENCOUNTER — Other Ambulatory Visit: Payer: Self-pay | Admitting: Family Medicine

## 2012-07-26 ENCOUNTER — Ambulatory Visit (HOSPITAL_COMMUNITY)
Admission: RE | Admit: 2012-07-26 | Discharge: 2012-07-26 | Disposition: A | Discharge status: Home / Self Care | Payer: BC Managed Care – PPO | Source: Ambulatory Visit | Attending: Family Medicine | Admitting: Family Medicine

## 2012-07-26 DIAGNOSIS — R1011 Right upper quadrant pain: Secondary | ICD-10-CM | POA: Insufficient documentation

## 2012-07-26 DIAGNOSIS — R109 Unspecified abdominal pain: Secondary | ICD-10-CM

## 2012-12-13 ENCOUNTER — Other Ambulatory Visit: Payer: Self-pay | Admitting: Family Medicine

## 2012-12-13 NOTE — Telephone Encounter (Signed)
Ok times one. Will nee f u visit before furthrrt written

## 2012-12-15 ENCOUNTER — Encounter: Payer: Self-pay | Admitting: *Deleted

## 2013-01-09 ENCOUNTER — Encounter: Payer: Self-pay | Admitting: Family Medicine

## 2013-01-09 ENCOUNTER — Ambulatory Visit (HOSPITAL_COMMUNITY)
Admission: RE | Admit: 2013-01-09 | Discharge: 2013-01-09 | Disposition: A | Discharge status: Home / Self Care | Payer: BC Managed Care – PPO | Source: Ambulatory Visit | Attending: Family Medicine | Admitting: Family Medicine

## 2013-01-09 ENCOUNTER — Ambulatory Visit (INDEPENDENT_AMBULATORY_CARE_PROVIDER_SITE_OTHER): Payer: BC Managed Care – PPO | Admitting: Family Medicine

## 2013-01-09 VITALS — BP 124/90 | Temp 97.9°F | Ht 71.0 in | Wt 218.1 lb

## 2013-01-09 DIAGNOSIS — M1990 Unspecified osteoarthritis, unspecified site: Secondary | ICD-10-CM

## 2013-01-09 DIAGNOSIS — Z125 Encounter for screening for malignant neoplasm of prostate: Secondary | ICD-10-CM

## 2013-01-09 DIAGNOSIS — Z79899 Other long term (current) drug therapy: Secondary | ICD-10-CM

## 2013-01-09 DIAGNOSIS — IMO0001 Reserved for inherently not codable concepts without codable children: Secondary | ICD-10-CM

## 2013-01-09 DIAGNOSIS — R079 Chest pain, unspecified: Secondary | ICD-10-CM

## 2013-01-09 DIAGNOSIS — M129 Arthropathy, unspecified: Secondary | ICD-10-CM

## 2013-01-09 DIAGNOSIS — R5381 Other malaise: Secondary | ICD-10-CM | POA: Insufficient documentation

## 2013-01-09 DIAGNOSIS — Z0000 Encounter for general adult medical examination without abnormal findings: Secondary | ICD-10-CM

## 2013-01-09 DIAGNOSIS — R5383 Other fatigue: Secondary | ICD-10-CM

## 2013-01-09 DIAGNOSIS — R0602 Shortness of breath: Secondary | ICD-10-CM | POA: Insufficient documentation

## 2013-01-09 DIAGNOSIS — I10 Essential (primary) hypertension: Secondary | ICD-10-CM

## 2013-01-09 NOTE — Progress Notes (Signed)
  Subjective:    Patient ID: Vernon Fisher, male    DOB: Jun 13, 1956, 57 y.o.   MRN: [id number]  HPI Fatigue and tired . Legs swelling, worse in the evening. Notes his usual energy is gone. He is working out in the heat a lot.  Celebrex helping pain okay.  Feels stiff in the morn,  Chest pain, sharp in nature, worse with a deep breath. Generally non-exertional.  Diet not good. Drinks tea,   Review of Systems No weight loss or weight gain fair appetite no chronic fevers no change in urinary or bowel habits.    Objective:   Physical Exam Alert no acute distress. Lungs clear. Heart regular rate and rhythm. No obvious chest wall tenderness. Abdomen benign. Ankles trace edema. Pulses good. Knees some crepitations. Hands some changes of Heberden's nodes.       Assessment & Plan:  Impression multitude of symptoms which unfortunately need further workup. #2 hypertension decent control. #3 fatigue. #4 chest pain highly doubt cardiac negative workup in the past. EKG warranted however. #5 joint discomfort. #6 Gen. malaise. Plan appropriate blood work. EKG this is negative. Chest x-ray. Followup in 1 week for discussion of results and further recommendations. Easily 25 minutes spent most in discussion. WSL

## 2013-01-10 LAB — HEPATIC FUNCTION PANEL
ALT: 43 U/L (ref 0–53)
AST: 19 U/L (ref 0–37)
Bilirubin, Direct: 0.2 mg/dL (ref 0.0–0.3)
Indirect Bilirubin: 0.7 mg/dL (ref 0.0–0.9)
Total Protein: 6.8 g/dL (ref 6.0–8.3)

## 2013-01-10 LAB — LIPID PANEL
Cholesterol: 182 mg/dL (ref 0–200)
HDL: 39 mg/dL — ABNORMAL LOW (ref >39)
Total CHOL/HDL Ratio: 4.7 Ratio

## 2013-01-10 LAB — BASIC METABOLIC PANEL
BUN: 10 mg/dL (ref 6–23)
Calcium: 9 mg/dL (ref 8.4–10.5)
Creat: 0.75 mg/dL (ref 0.50–1.35)
Glucose, Bld: 113 mg/dL — ABNORMAL HIGH (ref 70–99)

## 2013-01-10 LAB — SEDIMENTATION RATE: Sed Rate: 1 mm/hr (ref 0–16)

## 2013-01-11 LAB — CBC WITH DIFFERENTIAL/PLATELET
Eosinophils Absolute: 0.2 10*3/uL (ref 0.0–0.7)
Eosinophils Relative: 2 % (ref 0–5)
HCT: 48.1 % (ref 39.0–52.0)
Lymphocytes Relative: 30 % (ref 12–46)
Lymphs Abs: 2.5 10*3/uL (ref 0.7–4.0)
MCH: 30.8 pg (ref 26.0–34.0)
MCV: 86.5 fL (ref 78.0–100.0)
Monocytes Absolute: 0.7 10*3/uL (ref 0.1–1.0)
Platelets: 179 10*3/uL (ref 150–400)
RBC: 5.56 MIL/uL (ref 4.22–5.81)
RDW: 13.7 % (ref 11.5–15.5)
WBC: 8.4 10*3/uL (ref 4.0–10.5)

## 2013-01-16 ENCOUNTER — Encounter: Payer: Self-pay | Admitting: Family Medicine

## 2013-01-16 ENCOUNTER — Ambulatory Visit (INDEPENDENT_AMBULATORY_CARE_PROVIDER_SITE_OTHER): Payer: BC Managed Care – PPO | Admitting: Family Medicine

## 2013-01-16 VITALS — BP 118/78 | Wt 217.4 lb

## 2013-01-16 DIAGNOSIS — M129 Arthropathy, unspecified: Secondary | ICD-10-CM

## 2013-01-16 DIAGNOSIS — M1990 Unspecified osteoarthritis, unspecified site: Secondary | ICD-10-CM

## 2013-01-16 DIAGNOSIS — R079 Chest pain, unspecified: Secondary | ICD-10-CM

## 2013-01-16 DIAGNOSIS — I10 Essential (primary) hypertension: Secondary | ICD-10-CM

## 2013-01-16 DIAGNOSIS — R5381 Other malaise: Secondary | ICD-10-CM

## 2013-01-16 DIAGNOSIS — R5383 Other fatigue: Secondary | ICD-10-CM

## 2013-01-16 MED ORDER — PANTOPRAZOLE SODIUM 40 MG PO TBEC: 40.0000 mg | DELAYED_RELEASE_TABLET | Freq: Every day | ORAL | Status: DC

## 2013-01-16 MED ORDER — CELECOXIB 100 MG PO CAPS: 100.0000 mg | ORAL_CAPSULE | Freq: Every day | ORAL | Status: DC

## 2013-01-16 MED ORDER — ENALAPRIL MALEATE 10 MG PO TABS: 10.0000 mg | ORAL_TABLET | Freq: Every day | ORAL | Status: DC

## 2013-01-16 NOTE — Progress Notes (Signed)
Subjective:    Patient ID: Vernon Fisher, male    DOB: 14-Apr-1956, 57 y.o.   MRN: [id number]  HPI Results for orders placed in visit on 01/09/13  LIPID PANEL      Result Value Range   Cholesterol 182  0 - 200 mg/dL   Triglycerides 409 (*) <150 mg/dL   HDL 39 (*) >81 mg/dL   Total CHOL/HDL Ratio 4.7     VLDL 32  0 - 40 mg/dL   LDL Cholesterol 191 (*) 0 - 99 mg/dL  CBC WITH DIFFERENTIAL      Result Value Range   WBC 8.4  4.0 - 10.5 K/uL   RBC 5.56  4.22 - 5.81 MIL/uL   Hemoglobin 17.1 (*) 13.0 - 17.0 g/dL   HCT 47.8  29.5 - 62.1 %   MCV 86.5  78.0 - 100.0 fL   MCH 30.8  26.0 - 34.0 pg   MCHC 35.6  30.0 - 36.0 g/dL   RDW 30.8  65.7 - 84.6 %   Platelets 179  150 - 400 K/uL   Neutrophils Relative % 59  43 - 77 %   Neutro Abs 5.0  1.7 - 7.7 K/uL   Lymphocytes Relative 30  12 - 46 %   Lymphs Abs 2.5  0.7 - 4.0 K/uL   Monocytes Relative 9  3 - 12 %   Monocytes Absolute 0.7  0.1 - 1.0 K/uL   Eosinophils Relative 2  0 - 5 %   Eosinophils Absolute 0.2  0.0 - 0.7 K/uL   Basophils Relative 0  0 - 1 %   Basophils Absolute 0.0  0.0 - 0.1 K/uL   Smear Review      PSA      Result Value Range   PSA 0.57  <=4.00 ng/mL  HEPATIC FUNCTION PANEL      Result Value Range   Total Bilirubin 0.9  0.3 - 1.2 mg/dL   Bilirubin, Direct 0.2  0.0 - 0.3 mg/dL   Indirect Bilirubin 0.7  0.0 - 0.9 mg/dL   Alkaline Phosphatase 88  39 - 117 U/L   AST 19  0 - 37 U/L   ALT 43  0 - 53 U/L   Total Protein 6.8  6.0 - 8.3 g/dL   Albumin 4.2  3.5 - 5.2 g/dL  BASIC METABOLIC PANEL      Result Value Range   Sodium 139  135 - 145 mEq/L   Potassium 4.3  3.5 - 5.3 mEq/L   Chloride 104  96 - 112 mEq/L   CO2 29  19 - 32 mEq/L   Glucose, Bld 113 (*) 70 - 99 mg/dL   BUN 10  6 - 23 mg/dL   Creat 9.62  9.52 - 8.41 mg/dL   Calcium 9.0  8.4 - 32.4 mg/dL  SEDIMENTATION RATE      Result Value Range   Sed Rate 1  0 - 16 mm/hr   Patient arrives office for a lengthy discussion of all his concerns. He's frequently  tired. He claims he is not depressed. Having to work out in the heat. Not exercising much. Please see prior notes. Chest pain change in nature sometimes sharp. Wonders if it is his gallbladder. I reminded him to get a gallbladder ultrasound this past winter. His symptoms to me are not enough to warrant nuclear medicine assessment.  Patient reports sweats at times. In diffuse diminished energy. Patient also reports very significant challenges with swelling of ankles. On  further history usually after up on his feet all day. Usually when in the heat.  Review of Systems No major abdominal pain no shortness of breath positive chronic joint pain. No weight loss weight gain    Objective:   Physical Exam Alert no acute distress talkative. HEENT normal. Lungs clear. Heart regular in rhythm. Ankles trace edema.       Assessment & Plan:  Impression venous stasis-discussed. #2 chronic fatigue ongoing. Patient was once even advising me of fibromyalgia discomfort if it's with everything else. #3 arthritis. #4 chest pain with negative workup in the past. Plan diet exercise discussed in encourage maintain same meds. 25 minutes with patient most in discussion. Followup every 6 months. WSL

## 2013-01-19 ENCOUNTER — Encounter: Payer: Self-pay | Admitting: Family Medicine

## 2013-08-04 ENCOUNTER — Other Ambulatory Visit: Payer: Self-pay | Admitting: Family Medicine

## 2013-08-07 ENCOUNTER — Encounter: Payer: Self-pay | Admitting: Family Medicine

## 2013-08-07 ENCOUNTER — Ambulatory Visit (INDEPENDENT_AMBULATORY_CARE_PROVIDER_SITE_OTHER): Payer: BC Managed Care – PPO | Admitting: Family Medicine

## 2013-08-07 ENCOUNTER — Ambulatory Visit (HOSPITAL_COMMUNITY)
Admission: RE | Admit: 2013-08-07 | Discharge: 2013-08-07 | Disposition: A | Discharge status: Home / Self Care | Payer: BC Managed Care – PPO | Source: Ambulatory Visit | Attending: Family Medicine | Admitting: Family Medicine

## 2013-08-07 VITALS — BP 126/88 | Ht 71.0 in | Wt 222.5 lb

## 2013-08-07 DIAGNOSIS — R0600 Dyspnea, unspecified: Secondary | ICD-10-CM

## 2013-08-07 DIAGNOSIS — I10 Essential (primary) hypertension: Secondary | ICD-10-CM

## 2013-08-07 DIAGNOSIS — R5383 Other fatigue: Secondary | ICD-10-CM

## 2013-08-07 DIAGNOSIS — R0989 Other specified symptoms and signs involving the circulatory and respiratory systems: Secondary | ICD-10-CM

## 2013-08-07 DIAGNOSIS — R0609 Other forms of dyspnea: Secondary | ICD-10-CM | POA: Insufficient documentation

## 2013-08-07 DIAGNOSIS — M1990 Unspecified osteoarthritis, unspecified site: Secondary | ICD-10-CM

## 2013-08-07 DIAGNOSIS — R5381 Other malaise: Secondary | ICD-10-CM

## 2013-08-07 DIAGNOSIS — R079 Chest pain, unspecified: Secondary | ICD-10-CM

## 2013-08-07 DIAGNOSIS — M129 Arthropathy, unspecified: Secondary | ICD-10-CM

## 2013-08-07 LAB — PULMONARY FUNCTION TEST
FEF 25-75 Post: 6.28 L/s
FEF 25-75 Pre: 5.43 L/s
FEF2575-%Change-Post: 15 %
FEF2575-%Pred-Post: 190 %
FEF2575-%Pred-Pre: 164 %
FEV1-%Change-Post: 4 %
FEV1-%Pred-Post: 111 %
FEV1-%Pred-Pre: 107 %
FEV1-Post: 4.33 L
FEV1-Pre: 4.16 L
FEV1FVC-%Change-Post: 3 %
FEV1FVC-%Pred-Pre: 109 %
FEV6-%Change-Post: 0 %
FEV6-%Pred-Post: 101 %
FEV6-%Pred-Pre: 101 %
FEV6-Post: 4.97 L
FEV6-Pre: 4.94 L
FEV6FVC-%Change-Post: 0 %
FEV6FVC-%Pred-Post: 103 %
FEV6FVC-%Pred-Pre: 103 %
FVC-%Change-Post: 0 %
FVC-%Pred-Post: 98 %
FVC-%Pred-Pre: 97 %
FVC-Post: 4.98 L
FVC-Pre: 4.96 L
Post FEV1/FVC ratio: 87 %
Post FEV6/FVC ratio: 100 %
Pre FEV1/FVC ratio: 84 %
Pre FEV6/FVC Ratio: 100 %

## 2013-08-07 MED ORDER — ENALAPRIL MALEATE 10 MG PO TABS: ORAL_TABLET | ORAL | Status: DC

## 2013-08-07 MED ORDER — PANTOPRAZOLE SODIUM 40 MG PO TBEC: DELAYED_RELEASE_TABLET | ORAL | Status: DC

## 2013-08-07 MED ORDER — CELECOXIB 100 MG PO CAPS: 100.0000 mg | ORAL_CAPSULE | Freq: Every day | ORAL | Status: DC

## 2013-08-07 NOTE — Progress Notes (Signed)
   Subjective:    Patient ID: Eveline Ketoobert L Schicker, male    DOB: [birth year], 58 y.o.   MRN: [id number]  Dizziness This is a new problem. The current episode started more than 1 month ago. The problem occurs constantly. The problem has been gradually worsening. Associated symptoms include fatigue and headaches. Associated symptoms comments: Ringing in ears. Nothing aggravates the symptoms. He has tried nothing for the symptoms. The treatment provided no relief.   Dizziness evdry day, room spinning sens at times  Worse when pivoting and turning. Lasts for a few moments  Last couple minutes  Impacting on certain thing s hits hard,  Reflux fairly stable unti recently   Burps a lot  Has cut down salt. Compliant with bp med. Walks a lot at work. No smoking  No ha no cp Ongoing challenges with sleeping at night Patient also notes a burning discomfort anterior thighs particularly at work.  Review of Systems  Constitutional: Positive for fatigue.  Neurological: Positive for dizziness and headaches.   no vomiting no diarrhea no rash no change in bowel habits     Objective:   Physical Exam  Alert no apparent distress. Blood pressure good on repeat. 124/82 HEENT normal. Lungs clear. Heart regular rate rhythm.      Assessment & Plan:  Impression 1 unexplained dyspnea Patient notes significant difficulty with breathing. Short of breath at times. Particularly with exertion. Significant history of smoking. #2 hypertension good control. #3 protonic clinically stable #4 fatigue ongoing discussed plan appropriate blood work and x-ray and pulmonary function tests. Other meds refilled. Recheck for a wellness exams soon. WSL

## 2013-08-20 ENCOUNTER — Ambulatory Visit (HOSPITAL_COMMUNITY): Payer: BC Managed Care – PPO

## 2013-08-23 ENCOUNTER — Encounter: Payer: Self-pay | Admitting: Family Medicine

## 2013-08-23 ENCOUNTER — Ambulatory Visit (INDEPENDENT_AMBULATORY_CARE_PROVIDER_SITE_OTHER): Payer: BC Managed Care – PPO | Admitting: Family Medicine

## 2013-08-23 VITALS — Temp 98.7°F | Ht 71.0 in | Wt 217.0 lb

## 2013-08-23 DIAGNOSIS — J329 Chronic sinusitis, unspecified: Secondary | ICD-10-CM

## 2013-08-23 DIAGNOSIS — R300 Dysuria: Secondary | ICD-10-CM

## 2013-08-23 LAB — POCT URINALYSIS DIPSTICK
SPEC GRAV UA: 1.025
pH, UA: 5

## 2013-08-23 MED ORDER — CIPROFLOXACIN HCL 750 MG PO TABS: 750.0000 mg | ORAL_TABLET | Freq: Two times a day (BID) | ORAL | Status: DC

## 2013-08-23 MED ORDER — HYDROCODONE-HOMATROPINE 5-1.5 MG/5ML PO SYRP: 5.0000 mL | ORAL_SOLUTION | Freq: Every evening | ORAL | Status: DC | PRN

## 2013-08-23 NOTE — Progress Notes (Signed)
   Subjective:    Patient ID: Vernon Fisher, male    DOB: 12/06/1955, 58 y.o.   MRN: [id number]  Cough This is a new problem. The current episode started in the past 7 days. Associated symptoms include headaches, a sore throat, shortness of breath and wheezing. Associated symptoms comments: Diarrhea, vomiting. Treatments tried: sudafed. The treatment provided no relief.   Dysuria.  Started last night.   Cough rough last niht   achey-- tue eve  Got up every couple hrs last night Results for orders placed in visit on 08/23/13  POCT URINALYSIS DIPSTICK      Result Value Ref Range   Color, UA       Clarity, UA       Glucose, UA       Bilirubin, UA ++     Ketones, UA       Spec Grav, UA 1.025     Blood, UA       pH, UA 5.0     Protein, UA       Urobilinogen, UA       Nitrite, UA       Leukocytes, UA       Patient also notes trouble with urine flow. Progressive over the last several days. Since starting cold medicine worsen. Reminiscent of patients prostate infections in the past.  Review of Systems  HENT: Positive for sore throat.   Respiratory: Positive for cough, shortness of breath and wheezing.   Neurological: Positive for headaches.   No high fevers no vomiting ROS otherwise negative    Objective:   Physical Exam  Alert moderate malaise. Hydration good. HET moderate his congestion frontal tenderness. Pharynx normal neck supple. Lungs clear heart regular in rhythm. Prostate boggy tender.  Urinalysis 2 white blood cells high-power field      Assessment & Plan:  Impression 1 acute sinusitis. #2 acute prostatitis likely triggered by anti-cholinergic antihistamines discussed. Plan Cipro 750 twice a day 21 days. Symptomatic care discussed. Avoid cold medicine. WSL

## 2013-08-23 NOTE — Patient Instructions (Signed)
Try to avoid antihistamines which are given for drainage, runny nose etc. Goes under the name diphenhydramine, bromphineramine etc.

## 2013-08-27 ENCOUNTER — Ambulatory Visit (HOSPITAL_COMMUNITY)
Admission: RE | Admit: 2013-08-27 | Discharge: 2013-08-27 | Disposition: A | Discharge status: Home / Self Care | Payer: BC Managed Care – PPO | Source: Ambulatory Visit | Attending: Family Medicine | Admitting: Family Medicine

## 2013-08-27 DIAGNOSIS — Z87891 Personal history of nicotine dependence: Secondary | ICD-10-CM | POA: Insufficient documentation

## 2013-08-27 DIAGNOSIS — R05 Cough: Secondary | ICD-10-CM | POA: Insufficient documentation

## 2013-08-27 DIAGNOSIS — R059 Cough, unspecified: Secondary | ICD-10-CM | POA: Insufficient documentation

## 2013-08-27 DIAGNOSIS — R0989 Other specified symptoms and signs involving the circulatory and respiratory systems: Principal | ICD-10-CM | POA: Insufficient documentation

## 2013-08-27 DIAGNOSIS — R062 Wheezing: Secondary | ICD-10-CM | POA: Insufficient documentation

## 2013-08-27 DIAGNOSIS — R0609 Other forms of dyspnea: Secondary | ICD-10-CM | POA: Insufficient documentation

## 2013-08-27 MED ORDER — ALBUTEROL SULFATE (2.5 MG/3ML) 0.083% IN NEBU
2.5000 mg | INHALATION_SOLUTION | Freq: Once | RESPIRATORY_TRACT | Status: AC
  Administered 2013-08-27: 2.5 mg via RESPIRATORY_TRACT

## 2013-09-04 ENCOUNTER — Ambulatory Visit: Payer: BC Managed Care – PPO | Admitting: Family Medicine

## 2013-09-04 ENCOUNTER — Encounter: Payer: BC Managed Care – PPO | Admitting: Family Medicine

## 2013-09-10 ENCOUNTER — Encounter: Payer: BC Managed Care – PPO | Admitting: Family Medicine

## 2013-09-14 ENCOUNTER — Encounter: Payer: Self-pay | Admitting: Family Medicine

## 2013-09-14 ENCOUNTER — Ambulatory Visit (HOSPITAL_COMMUNITY)
Admission: RE | Admit: 2013-09-14 | Discharge: 2013-09-14 | Disposition: A | Discharge status: Home / Self Care | Payer: BC Managed Care – PPO | Source: Ambulatory Visit | Attending: Family Medicine | Admitting: Family Medicine

## 2013-09-14 ENCOUNTER — Ambulatory Visit (INDEPENDENT_AMBULATORY_CARE_PROVIDER_SITE_OTHER): Payer: BC Managed Care – PPO | Admitting: Family Medicine

## 2013-09-14 VITALS — BP 122/78 | HR 80 | Ht 69.0 in | Wt 216.0 lb

## 2013-09-14 DIAGNOSIS — M79609 Pain in unspecified limb: Secondary | ICD-10-CM | POA: Insufficient documentation

## 2013-09-14 DIAGNOSIS — M25551 Pain in right hip: Secondary | ICD-10-CM

## 2013-09-14 DIAGNOSIS — Z0000 Encounter for general adult medical examination without abnormal findings: Secondary | ICD-10-CM

## 2013-09-14 DIAGNOSIS — M25559 Pain in unspecified hip: Secondary | ICD-10-CM

## 2013-09-14 DIAGNOSIS — M898X5 Other specified disorders of bone, thigh: Secondary | ICD-10-CM

## 2013-09-14 DIAGNOSIS — M899 Disorder of bone, unspecified: Secondary | ICD-10-CM

## 2013-09-14 DIAGNOSIS — M949 Disorder of cartilage, unspecified: Secondary | ICD-10-CM

## 2013-09-14 DIAGNOSIS — Z23 Encounter for immunization: Secondary | ICD-10-CM

## 2013-09-14 NOTE — Progress Notes (Signed)
   Subjective:    Patient ID: Vernon Fisher, male    DOB: 08-May-1956, 58 y.o.   MRN: [id number]  HPIHere for a physical. Patient has concerns about leg pain, fatigue, and headaches. Patient states symptoms have been going on for months.   Exercising daily with work, walks a lot  Full time all the day  Last colonoscop 2012  Last tet unknow  No sig problems with feeling down or derpressed  Stopped celebrex.  Having ongoing erection issues, develops sig headache sec to this Review of Systems  Constitutional: Negative for fever, activity change and appetite change.  HENT: Negative for congestion and rhinorrhea.   Eyes: Negative for discharge.  Respiratory: Negative for cough and wheezing.   Cardiovascular: Negative for chest pain.  Gastrointestinal: Negative for vomiting, abdominal pain and blood in stool.  Genitourinary: Negative for frequency and difficulty urinating.  Musculoskeletal: Negative for neck pain.  Skin: Negative for rash.  Allergic/Immunologic: Negative for environmental allergies and food allergies.  Neurological: Negative for weakness and headaches.  Psychiatric/Behavioral: Negative for agitation.       Objective:   Physical Exam  Vitals reviewed. Constitutional: He appears well-developed and well-nourished.  HENT:  Head: Normocephalic and atraumatic.  Right Ear: External ear normal.  Left Ear: External ear normal.  Nose: Nose normal.  Mouth/Throat: Oropharynx is clear and moist.  Eyes: EOM are normal. Pupils are equal, round, and reactive to light.  Neck: Normal range of motion. Neck supple. No thyromegaly present.  Cardiovascular: Normal rate, regular rhythm and normal heart sounds.   No murmur heard. Pulmonary/Chest: Effort normal and breath sounds normal. No respiratory distress. He has no wheezes.  Abdominal: Soft. Bowel sounds are normal. He exhibits no distension and no mass. There is no tenderness.  Genitourinary: Penis normal.    Musculoskeletal: Normal range of motion. He exhibits no edema.  Lymphadenopathy:    He has no cervical adenopathy.  Neurological: He is alert. He exhibits normal muscle tone.  Skin: Skin is warm and dry. No erythema.  Psychiatric: He has a normal mood and affect. His behavior is normal. Judgment normal.          Assessment & Plan:  Impression #1 wellness exam #2 hypertension good control. #3 dyspnea with negative workup normal pulmonary function tests normal chest x-ray. #4 tremendous ongoing  symptomatology. With many negative workups in the past. Patient not really admitting to any mental component plan appropriate immunizations discuss and administered. Hemoccult cards. Colonoscopy not due yet discussed followup for chronic problems visits at next appointment. WSL

## 2013-09-24 ENCOUNTER — Other Ambulatory Visit: Payer: Self-pay | Admitting: *Deleted

## 2013-09-24 DIAGNOSIS — Z0000 Encounter for general adult medical examination without abnormal findings: Secondary | ICD-10-CM

## 2013-09-24 LAB — POC HEMOCCULT BLD/STL (HOME/3-CARD/SCREEN)
FECAL OCCULT BLD: NEGATIVE
FECAL OCCULT BLD: NEGATIVE
FECAL OCCULT BLD: NEGATIVE

## 2013-09-28 ENCOUNTER — Ambulatory Visit (INDEPENDENT_AMBULATORY_CARE_PROVIDER_SITE_OTHER): Payer: BC Managed Care – PPO | Admitting: Family Medicine

## 2013-09-28 ENCOUNTER — Encounter: Payer: Self-pay | Admitting: Family Medicine

## 2013-09-28 VITALS — BP 122/84 | Ht 69.0 in | Wt 219.0 lb

## 2013-09-28 DIAGNOSIS — IMO0001 Reserved for inherently not codable concepts without codable children: Secondary | ICD-10-CM

## 2013-09-28 DIAGNOSIS — M797 Fibromyalgia: Secondary | ICD-10-CM

## 2013-09-28 MED ORDER — CYCLOBENZAPRINE HCL 10 MG PO TABS: 10.0000 mg | ORAL_TABLET | Freq: Every day | ORAL | Status: DC

## 2013-09-28 MED ORDER — NORTRIPTYLINE HCL 25 MG PO CAPS: ORAL_CAPSULE | ORAL | Status: DC

## 2013-09-28 NOTE — Progress Notes (Signed)
   Subjective:    Patient ID: Vernon Fisher, male    DOB: May 11, 1956, 58 y.o.   MRN: [id number]  Leg Pain  Incident onset: 2 months ago. The incident occurred at home. There was no injury mechanism. The pain is present in the left leg and right leg. The quality of the pain is described as burning. The pain has been constant since onset. Associated symptoms include a loss of sensation, muscle weakness, numbness and tingling. He reports no foreign bodies present. The symptoms are aggravated by palpation and weight bearing. He has tried elevation for the symptoms. The treatment provided no relief.   Muscles aching arms legs,  Muscles sore, Discomfort all the time  Muscle pain in the arms is worse when reaching above the arms  With persisten pain in the legs,  At night wakes up hurting  Deep discomfort  Peak patient back to working full-time but having a difficult time with that.   Review of Systems  Neurological: Positive for tingling and numbness.   no headache no chest pain no back pain and weight loss no weight gain ROS otherwise negative     Objective:   Physical Exam Alert mild malaise. HEENT normal. Lungs clear. Heart regular in rhythm. Diffuse muscle tenderness and upper shoulders anterior thighs and low and mid back. Deep tendon reflexes intact sensation pulses good.   Thorough review of old records past 20 years done in presence of patient    Assessment & Plan:  Impression fibromyalgia equivalent. Patient was disabled laterally for years with this. He's had multiple assessments by subspecialist down through the years. All have been going back to diagnosis of fibromyalgia. Unfortunately those interventions have not helped very much. Plan Flexeril each bedtime. Nortriptyline each bedtime. This worked on both the muscle side and chronic pain side discussed. Exercise encourage. Followup as scheduled. Easily 25 minutes spent most in discussion. Just had blood work year ago which  showed no inflammatory or autoimmune inflammatory component. Patient adamant that not depressed WSL

## 2013-09-29 DIAGNOSIS — M797 Fibromyalgia: Secondary | ICD-10-CM | POA: Insufficient documentation

## 2013-11-12 ENCOUNTER — Telehealth: Payer: Self-pay | Admitting: Family Medicine

## 2013-11-12 MED ORDER — NORTRIPTYLINE HCL 25 MG PO CAPS
ORAL_CAPSULE | ORAL | Status: DC
Start: 2013-11-12 — End: 2014-02-11

## 2013-11-12 NOTE — Telephone Encounter (Signed)
Rx corrected and sent electronically to pharmacy. Patient notified. 

## 2013-11-12 NOTE — Telephone Encounter (Signed)
Patient said that he was prescribed nortriptyline 1 times daily for the first month, and 2 daily for the 2nd month. Pharmacy did not fill it correctly so he only has about 15 pills left. Please advise.   [facility]

## 2013-11-28 ENCOUNTER — Encounter: Payer: Self-pay | Admitting: Family Medicine

## 2013-11-28 ENCOUNTER — Ambulatory Visit (INDEPENDENT_AMBULATORY_CARE_PROVIDER_SITE_OTHER): Payer: BC Managed Care – PPO | Admitting: Family Medicine

## 2013-11-28 VITALS — BP 108/80 | Ht 69.0 in | Wt 217.5 lb

## 2013-11-28 DIAGNOSIS — I10 Essential (primary) hypertension: Secondary | ICD-10-CM

## 2013-11-28 DIAGNOSIS — M797 Fibromyalgia: Secondary | ICD-10-CM

## 2013-11-28 DIAGNOSIS — IMO0001 Reserved for inherently not codable concepts without codable children: Secondary | ICD-10-CM

## 2013-11-28 NOTE — Progress Notes (Signed)
   Subjective:    Patient ID: Vernon Fisher, male    DOB: [birth year], 58 y.o.   MRN: [id number]  HPI Patient is here today for a follow up visit on fibromyalgia. Patient states that the pain is "a little bit better".   Working four days per wk  Exercise is so so  [employer] a lot at work  stayeed active with family also  Takes the med at night and usually around ten, Patient states he has no other concerns at this time.   Patient does notice morning drowsiness at times with the nortriptyline. Does seem to be helping his chronic pain. However he does not like to drowsiness. On further history. Drowsiness is generally when he takes medicine late at midnight or so. Review of Systems No headache no chest pain no abdominal pain no change in bowel habits no rash no blood in stool ROS otherwise negative    Objective:   Physical Exam  Alert moderate malaise. HEENT normal neck supple. Lungs clear. Heart rate rhythm. Ankles without edema. Diffuse muscle tenderness      Assessment & Plan:  Impression fibromyalgia very long discussion held about both what we know about this condition, and what medical signs does not know. At this time further consultations workup is not warranted. Encouraged patient not to take it the nortriptyline to late in the evening which will increased risk of morning drowsiness. Plan exercise strongly encourage. Maintain other medications. Followup every 6 months. WSL

## 2013-12-11 ENCOUNTER — Ambulatory Visit (INDEPENDENT_AMBULATORY_CARE_PROVIDER_SITE_OTHER): Payer: BC Managed Care – PPO | Admitting: Family Medicine

## 2013-12-11 ENCOUNTER — Telehealth: Payer: Self-pay | Admitting: Family Medicine

## 2013-12-11 ENCOUNTER — Encounter: Payer: Self-pay | Admitting: Family Medicine

## 2013-12-11 VITALS — BP 130/90 | Ht 69.0 in | Wt 215.2 lb

## 2013-12-11 DIAGNOSIS — Z125 Encounter for screening for malignant neoplasm of prostate: Secondary | ICD-10-CM

## 2013-12-11 DIAGNOSIS — I10 Essential (primary) hypertension: Secondary | ICD-10-CM

## 2013-12-11 DIAGNOSIS — M722 Plantar fascial fibromatosis: Secondary | ICD-10-CM

## 2013-12-11 DIAGNOSIS — R739 Hyperglycemia, unspecified: Secondary | ICD-10-CM

## 2013-12-11 DIAGNOSIS — Z13220 Encounter for screening for lipoid disorders: Secondary | ICD-10-CM

## 2013-12-11 DIAGNOSIS — Z79899 Other long term (current) drug therapy: Secondary | ICD-10-CM

## 2013-12-11 MED ORDER — PREDNISONE 20 MG PO TABS: ORAL_TABLET | ORAL | Status: DC

## 2013-12-11 NOTE — Telephone Encounter (Signed)
Steve's pt. Last labs 01/09/13: Lip, CBC, PSA, Liv, Sed Rate, and Met 7.

## 2013-12-11 NOTE — Telephone Encounter (Signed)
Patient needs order for blood work. °

## 2013-12-11 NOTE — Patient Instructions (Signed)
dont celebrex while on prednisone  If ongoing pain call and we will set up podiatry

## 2013-12-11 NOTE — Progress Notes (Signed)
   Subjective:    Patient ID: Vernon Fisher, male    DOB: [birth year], 58 y.o.   MRN: [id number]  HPI Patient states that he is having bilateral foot pain. Radiates up both legs at times. This has been present for about 1 week now. Patient states that he has been soaking his feet and this has not given him any relief. Patient states he has no other concerns at this time.    Review of Systems Denies any injury to it denies any pain calf pain thigh pain abdominal pain no fevers.    Objective:   Physical Exam Bilateral heel tenderness some pain on the side of the legs calves are normal knees normal       Assessment & Plan:  Plantar fascia I disc-stretching exercises shown. Prednisone taper. Patient was told if he does not get significantly better in the next 2 weeks to let us know we will help set him up with podiatry. Patient is not wondering to get injections currently.

## 2013-12-12 NOTE — Telephone Encounter (Signed)
Blood work papers place in [employer]. Patient notified.

## 2013-12-12 NOTE — Telephone Encounter (Signed)
Lipid, liver, metabolic 7, urine micro-protein, hemoglobin A1c, PSA

## 2013-12-17 LAB — HEPATIC FUNCTION PANEL
ALBUMIN: 4.4 g/dL (ref 3.5–5.2)
ALT: 32 U/L (ref 0–53)
AST: 17 U/L (ref 0–37)
Alkaline Phosphatase: 93 U/L (ref 39–117)
BILIRUBIN INDIRECT: 0.8 mg/dL (ref 0.2–1.2)
Bilirubin, Direct: 0.2 mg/dL (ref 0.0–0.3)
TOTAL PROTEIN: 6.9 g/dL (ref 6.0–8.3)
Total Bilirubin: 1 mg/dL (ref 0.2–1.2)

## 2013-12-17 LAB — LIPID PANEL
CHOL/HDL RATIO: 3.3 ratio
CHOLESTEROL: 158 mg/dL (ref 0–200)
HDL: 48 mg/dL (ref >39)
LDL CALC: 94 mg/dL (ref 0–99)
TRIGLYCERIDES: 79 mg/dL (ref <150)
VLDL: 16 mg/dL (ref 0–40)

## 2013-12-17 LAB — BASIC METABOLIC PANEL
BUN: 9 mg/dL (ref 6–23)
CHLORIDE: 98 meq/L (ref 96–112)
CO2: 31 mEq/L (ref 19–32)
Calcium: 9.2 mg/dL (ref 8.4–10.5)
Creat: 0.76 mg/dL (ref 0.50–1.35)
Glucose, Bld: 104 mg/dL — ABNORMAL HIGH (ref 70–99)
POTASSIUM: 4 meq/L (ref 3.5–5.3)
SODIUM: 140 meq/L (ref 135–145)

## 2013-12-17 LAB — HEMOGLOBIN A1C
HEMOGLOBIN A1C: 5.6 % (ref <5.7)
Mean Plasma Glucose: 114 mg/dL (ref <117)

## 2013-12-18 LAB — MICROALBUMIN, URINE: Microalb, Ur: 0.54 mg/dL (ref 0.00–1.89)

## 2013-12-18 LAB — PSA: PSA: 0.56 ng/mL (ref <=4.00)

## 2013-12-24 ENCOUNTER — Encounter: Payer: Self-pay | Admitting: Family Medicine

## 2014-01-29 ENCOUNTER — Ambulatory Visit: Payer: BC Managed Care – PPO | Admitting: Family Medicine

## 2014-02-11 ENCOUNTER — Encounter: Payer: Self-pay | Admitting: Family Medicine

## 2014-02-11 ENCOUNTER — Ambulatory Visit (INDEPENDENT_AMBULATORY_CARE_PROVIDER_SITE_OTHER): Payer: BC Managed Care – PPO | Admitting: Family Medicine

## 2014-02-11 VITALS — BP 138/88 | Ht 69.0 in | Wt 218.0 lb

## 2014-02-11 DIAGNOSIS — IMO0001 Reserved for inherently not codable concepts without codable children: Secondary | ICD-10-CM

## 2014-02-11 DIAGNOSIS — R7309 Other abnormal glucose: Secondary | ICD-10-CM

## 2014-02-11 DIAGNOSIS — I10 Essential (primary) hypertension: Secondary | ICD-10-CM

## 2014-02-11 DIAGNOSIS — R7301 Impaired fasting glucose: Secondary | ICD-10-CM

## 2014-02-11 DIAGNOSIS — M797 Fibromyalgia: Secondary | ICD-10-CM

## 2014-02-11 MED ORDER — PANTOPRAZOLE SODIUM 40 MG PO TBEC: DELAYED_RELEASE_TABLET | ORAL | Status: DC

## 2014-02-11 MED ORDER — DOXYCYCLINE HYCLATE 100 MG PO TABS: 100.0000 mg | ORAL_TABLET | Freq: Two times a day (BID) | ORAL | Status: DC

## 2014-02-11 MED ORDER — HYDROCODONE-ACETAMINOPHEN 7.5-325 MG PO TABS: ORAL_TABLET | ORAL | Status: DC

## 2014-02-11 MED ORDER — ENALAPRIL MALEATE 10 MG PO TABS: ORAL_TABLET | ORAL | Status: DC

## 2014-02-11 NOTE — Progress Notes (Signed)
   Subjective:    Patient ID: Vernon Fisher, male    DOB: 1956/05/24, 58 y.o.   MRN: [id number]  Diabetes He presents for his follow-up diabetic visit. He has type 2 diabetes mellitus. Hypoglycemia symptoms include hunger, nervousness/anxiousness and tremors. Associated symptoms include fatigue. Current diabetic treatment includes diet. He is following a diabetic diet. Blood glucose monitoring compliance is poor. His overall blood glucose range is 110-130 mg/dl. He does not see a podiatrist.Eye exam is not current.   Pt is here today to go over BW results. His A1C was 5.6  Trying to watch sugars in the diet.  Gets fair exercise with work.  nortryptiline did not help, so stopped.  Pt rarely take hydrocod at night  Sticks with bp med. Watching salt intake closely, Overall good with salt intake.    Results for orders placed in visit on 12/11/13  HEPATIC FUNCTION PANEL      Result Value Ref Range   Total Bilirubin 1.0  0.2 - 1.2 mg/dL   Bilirubin, Direct 0.2  0.0 - 0.3 mg/dL   Indirect Bilirubin 0.8  0.2 - 1.2 mg/dL   Alkaline Phosphatase 93  39 - 117 U/L   AST 17  0 - 37 U/L   ALT 32  0 - 53 U/L   Total Protein 6.9  6.0 - 8.3 g/dL   Albumin 4.4  3.5 - 5.2 g/dL  BASIC METABOLIC PANEL      Result Value Ref Range   Sodium 140  135 - 145 mEq/L   Potassium 4.0  3.5 - 5.3 mEq/L   Chloride 98  96 - 112 mEq/L   CO2 31  19 - 32 mEq/L   Glucose, Bld 104 (*) 70 - 99 mg/dL   BUN 9  6 - 23 mg/dL   Creat 4.090.76  8.110.50 - 9.141.35 mg/dL   Calcium 9.2  8.4 - 78.210.5 mg/dL  LIPID PANEL      Result Value Ref Range   Cholesterol 158  0 - 200 mg/dL   Triglycerides 79  <956<150 mg/dL   HDL 48  >21>39 mg/dL   Total CHOL/HDL Ratio 3.3     VLDL 16  0 - 40 mg/dL   LDL Cholesterol 94  0 - 99 mg/dL  MICROALBUMIN, URINE      Result Value Ref Range   Microalb, Ur 0.54  0.00 - 1.89 mg/dL  PSA      Result Value Ref Range   PSA 0.56  <=4.00 ng/mL  HEMOGLOBIN A1C      Result Value Ref Range   Hemoglobin A1C  5.6  <5.7 %   Mean Plasma Glucose 114  <117 mg/dL    Review of Systems  Constitutional: Positive for fatigue.  Neurological: Positive for tremors.  Psychiatric/Behavioral: The patient is nervous/anxious.        Objective:   Physical Exam  Alert no acute distress title stable. Blood pressure good on repeat. HEENT normal. Lungs clear. Heart rare rhythm. Ankles without him. Significant point tenderness. Diffusely      Assessment & Plan:  A impression 1 fibromyalgia ongoing with not much help from tremendous number of modalities interventions etc. #2 hypertension good control. #3 impaired fasting glucose not 2 diabetes discussed. Plan maintain same meds. Diet exercise discussed. Check every 6 months.

## 2014-03-25 ENCOUNTER — Other Ambulatory Visit: Payer: Self-pay | Admitting: Family Medicine

## 2014-04-23 ENCOUNTER — Encounter: Payer: Self-pay | Admitting: Family Medicine

## 2014-04-23 ENCOUNTER — Ambulatory Visit (INDEPENDENT_AMBULATORY_CARE_PROVIDER_SITE_OTHER): Payer: BC Managed Care – PPO | Admitting: Family Medicine

## 2014-04-23 VITALS — BP 150/94 | Temp 98.3°F | Ht 71.0 in | Wt 215.0 lb

## 2014-04-23 DIAGNOSIS — M797 Fibromyalgia: Secondary | ICD-10-CM

## 2014-04-23 DIAGNOSIS — Z23 Encounter for immunization: Secondary | ICD-10-CM

## 2014-04-23 MED ORDER — PREGABALIN 75 MG PO CAPS: 75.0000 mg | ORAL_CAPSULE | Freq: Two times a day (BID) | ORAL | Status: DC

## 2014-04-23 NOTE — Progress Notes (Signed)
   Subjective:    Patient ID: Vernon Fisher, male    DOB: 28-Jun-1956, 58 y.o.   MRN: [id number]  HPIPt having pain, numbness, and burning all over. Diagnosed with fibromyalgia 4 -5 years ago.  Getting worse.   Taking hydrocodone and nortriptyline.  No relief.   Patient once again returns for an enormously long conversation regarding his diffuse fatigue, tiredness, and long-term diagnosis of fibromyalgia. Patient has had multiple negative rheumatological workups. Was given a working diagnosis of fibromyalgia. Describes aching pain in a nondermatomal pattern waxing and waning overreporting much his whole body.  This is often accompanied by profound fatigue and tiredness. Patient literally has had years where he did not work partly because of this.  In the past Neurontin helped some but not a lot.  Technique nighttime no trampoline this helped a little not much stopped.  Compliant blood pressure medication.  Review of Systems No headache no chest pain no abdominal pain no change bowel habits no blood in stool ROS otherwise negative    Objective:   Physical Exam  Alert no apparent distress HEENT normal neck supple. Lungs clear heart regular rhythm ankle edema no hot inflamed joints. No crepitations.      Assessment & Plan:  Impression fibromyalgia with pronounced chronic symptomatology discussed at length #2 element of arthritis #3 hypertension controlled #4 reflux stable plan diet exercise discussed and encouraged. Initiate Lyrica rationale discussed. Some help from Neurontin the past up as scheduled. Easily 25 minutes spent most and discussion. W SL

## 2014-04-24 ENCOUNTER — Other Ambulatory Visit: Payer: Self-pay | Admitting: Family Medicine

## 2014-05-03 ENCOUNTER — Telehealth: Payer: Self-pay | Admitting: Family Medicine

## 2014-05-03 MED ORDER — PERMETHRIN 5 % EX CREA: TOPICAL_CREAM | CUTANEOUS | Status: DC

## 2014-05-03 NOTE — Telephone Encounter (Signed)
Vernon Fisher  [name] was diagnosed with scabies via our office on 10/9  His grandpa Conni SlipperBobby Ost is having issues with it now due to having him Over the weekends. States child goes back to moms then comes back with the  Scabies.   Please call when meds sent in

## 2014-05-03 NOTE — Telephone Encounter (Signed)
Elimite sent to pharmacy per protocol. Patient was notified.

## 2014-05-31 ENCOUNTER — Ambulatory Visit: Payer: BC Managed Care – PPO | Admitting: Family Medicine

## 2014-06-24 ENCOUNTER — Encounter: Payer: Self-pay | Admitting: Family Medicine

## 2014-06-24 ENCOUNTER — Ambulatory Visit (INDEPENDENT_AMBULATORY_CARE_PROVIDER_SITE_OTHER): Payer: BLUE CROSS/BLUE SHIELD | Admitting: Family Medicine

## 2014-06-24 VITALS — BP 124/90 | Ht 71.0 in | Wt 218.4 lb

## 2014-06-24 DIAGNOSIS — R7309 Other abnormal glucose: Secondary | ICD-10-CM

## 2014-06-24 DIAGNOSIS — R7301 Impaired fasting glucose: Secondary | ICD-10-CM

## 2014-06-24 DIAGNOSIS — G894 Chronic pain syndrome: Secondary | ICD-10-CM

## 2014-06-24 DIAGNOSIS — M797 Fibromyalgia: Secondary | ICD-10-CM

## 2014-06-24 DIAGNOSIS — I10 Essential (primary) hypertension: Secondary | ICD-10-CM

## 2014-06-24 MED ORDER — HYDROCODONE-ACETAMINOPHEN 7.5-325 MG PO TABS: ORAL_TABLET | ORAL | Status: DC

## 2014-06-24 MED ORDER — CELECOXIB 100 MG PO CAPS: 100.0000 mg | ORAL_CAPSULE | Freq: Every day | ORAL | Status: DC

## 2014-06-24 MED ORDER — ENALAPRIL MALEATE 10 MG PO TABS: ORAL_TABLET | ORAL | Status: DC

## 2014-06-24 MED ORDER — PANTOPRAZOLE SODIUM 40 MG PO TBEC: DELAYED_RELEASE_TABLET | ORAL | Status: DC

## 2014-06-24 MED ORDER — AZITHROMYCIN 250 MG PO TABS: ORAL_TABLET | ORAL | Status: DC

## 2014-06-24 MED ORDER — HYDROCODONE-ACETAMINOPHEN 7.5-325 MG PO TABS
ORAL_TABLET | ORAL | Status: DC
Start: 2014-06-24 — End: 2014-06-24

## 2014-06-24 NOTE — Addendum Note (Signed)
Addended by: Donna BernardLUKING, STEPHEN W on: 06/24/2014 12:14 PM   Modules accepted: Orders
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
## 2014-07-31 ENCOUNTER — Ambulatory Visit (HOSPITAL_COMMUNITY)
Admission: RE | Admit: 2014-07-31 | Discharge: 2014-07-31 | Disposition: A | Discharge status: Home / Self Care | Payer: 59 | Source: Ambulatory Visit | Attending: Family Medicine | Admitting: Family Medicine

## 2014-07-31 ENCOUNTER — Ambulatory Visit (INDEPENDENT_AMBULATORY_CARE_PROVIDER_SITE_OTHER): Payer: 59 | Admitting: Family Medicine

## 2014-07-31 ENCOUNTER — Encounter: Payer: Self-pay | Admitting: Family Medicine

## 2014-07-31 VITALS — BP 118/80 | Temp 98.3°F | Ht 71.0 in | Wt 226.0 lb

## 2014-07-31 DIAGNOSIS — M25562 Pain in left knee: Secondary | ICD-10-CM | POA: Insufficient documentation

## 2014-07-31 DIAGNOSIS — G894 Chronic pain syndrome: Secondary | ICD-10-CM

## 2014-07-31 DIAGNOSIS — M797 Fibromyalgia: Secondary | ICD-10-CM

## 2014-07-31 DIAGNOSIS — M25512 Pain in left shoulder: Secondary | ICD-10-CM | POA: Diagnosis not present

## 2014-07-31 DIAGNOSIS — I10 Essential (primary) hypertension: Secondary | ICD-10-CM

## 2014-07-31 MED ORDER — ESCITALOPRAM OXALATE 10 MG PO TABS: 10.0000 mg | ORAL_TABLET | Freq: Every day | ORAL | Status: DC

## 2014-07-31 MED ORDER — HYDROCODONE-ACETAMINOPHEN 7.5-325 MG PO TABS: ORAL_TABLET | ORAL | Status: DC

## 2014-07-31 MED ORDER — HYDROCODONE-ACETAMINOPHEN 7.5-325 MG PO TABS
ORAL_TABLET | ORAL | Status: DC
Start: 2014-07-31 — End: 2014-07-31

## 2014-07-31 MED ORDER — LORAZEPAM 1 MG PO TABS: 1.0000 mg | ORAL_TABLET | Freq: Every evening | ORAL | Status: DC | PRN

## 2014-07-31 MED ORDER — DOXYCYCLINE HYCLATE 100 MG PO TABS: 100.0000 mg | ORAL_TABLET | Freq: Two times a day (BID) | ORAL | Status: DC

## 2014-07-31 NOTE — Patient Instructions (Signed)

## 2014-07-31 NOTE — Progress Notes (Signed)
   Subjective:    Patient ID: Vernon Fisher, male    DOB: Nov 03, 1955, 59 y.o.   MRN: [id number]  HPI Patient is here today d/t fibromyalgia.  He has not heard anything from Dr. Ollen BowlHarkins office. I see that the referral was placed on 12/21 and the info was faxed. They are supposed to call pt to set up appt, but they have not yet.  Pt says his pain is getting worst. He cannot sleep at night. The pain med is only working for 4 hrs at a time.  He does not want to get up and do anything. He does not even want to go to work.  Pt moving wood wat work, pulled on it, and it hurts.  Movement in the left shoulder leads to severe pain.  Hx rot cuff injury on right   Pain feels similar,  Left knee started acting u the past month or so, flares and hurts, severe pain in left knee.  Sleeping at night sig problem,  Patient admits that some of his symptomatology is due to stress. Wife was recently diagnosed with fairly advancedq carcinoma. She is facing chemotherapy and he is understandably anxious about this. He agrees that things have flared up.  However patient states his left knee and shoulder pain are definitely more orthopedic. Has a history of rotator cuff injury in the other shoulder and it feels similar   Review of Systems No headache no chest pain no back pain abdominal pain no change in bowel habits no blood in stool    Objective:   Physical Exam  Alert mild malaise. H&T normal. Lungs clear heart rare rhythm ankles without edema shoulder positive impingement sign.      Assessment & Plan:  Impression #1 orthopedic concerns discussed #2 fibromyalgia worsening #3 hypertension stable #4 element of depression, patient is willing to admit that this is likely the case. Plan continue referral towards pain specialist. Orthopedic referral. X-rays. Increase pain medication. Diet exercise discussed in encourage. WSL

## 2014-08-05 NOTE — Progress Notes (Signed)
Pt notified and verbalized understanding.

## 2014-08-06 ENCOUNTER — Ambulatory Visit (INDEPENDENT_AMBULATORY_CARE_PROVIDER_SITE_OTHER): Payer: 59 | Admitting: Orthopedic Surgery

## 2014-08-06 ENCOUNTER — Encounter: Payer: Self-pay | Admitting: Orthopedic Surgery

## 2014-08-06 VITALS — BP 116/81 | Ht 71.0 in | Wt 226.0 lb

## 2014-08-06 DIAGNOSIS — M25562 Pain in left knee: Secondary | ICD-10-CM

## 2014-08-06 DIAGNOSIS — R208 Other disturbances of skin sensation: Secondary | ICD-10-CM

## 2014-08-06 DIAGNOSIS — M23301 Other meniscus derangements, unspecified lateral meniscus, left knee: Secondary | ICD-10-CM

## 2014-08-06 DIAGNOSIS — M23304 Other meniscus derangements, unspecified medial meniscus, left knee: Secondary | ICD-10-CM

## 2014-08-06 DIAGNOSIS — M23307 Other meniscus derangements, unspecified meniscus, left knee: Secondary | ICD-10-CM

## 2014-08-06 DIAGNOSIS — R200 Anesthesia of skin: Secondary | ICD-10-CM

## 2014-08-06 DIAGNOSIS — M549 Dorsalgia, unspecified: Secondary | ICD-10-CM

## 2014-08-06 DIAGNOSIS — G8929 Other chronic pain: Secondary | ICD-10-CM

## 2014-08-06 NOTE — Progress Notes (Signed)
Patient ID: Vernon Fisher, male   DOB: 06/13/1956, 59 y.o.   MRN: [id number] Patient ID: Vernon Fisher, male   DOB: 06/13/1956, 59 y.o.   MRN: [id number]  Chief Complaint  Patient presents with  . Knee Pain    left knee pain x 5 weeks, no known injury    HPI Vernon Fisher is a 59 y.o. male.  Her presents with a 5 month history of left knee pain. He denies any trauma. He says many weeks back he was crawling in his crawl space and felt something weird in his knee but it went away and then it came back about 5 weeks later. He now has throbbing burning stabbing medial knee pain constant) 5 and 10 intensity and is unrelieved by Celebrex for the last 6 weeks. He has had difficulty weightbearing. No range of motion loss. HPI  Review of Systems Review of Systems  Night sweats and fatigue, ringing of the years with sinus problems and sore throat. Vision problems. Shortness of breath cough and wheezing. Leg edema. Abdominal pain diarrhea nausea. Excess nighttime and frequent urination. Depression. He also has numbness tingling burning pain in his legs related to his chronic back pain. This is bilateral. He also has dizziness weakness tremors and lightheadedness. Skin rash and redness. Joint pain limb pain muscle weakness stiff joints swollen joints back pain as stated  All other systems were reviewed and were negative. Past Medical History  Diagnosis Date  . GERD (gastroesophageal reflux disease)   . Hypertension   . Diabetes mellitus without complication   . Migraine headache   . Degenerative disc disease   . Chronic pain   . Fibromyalgia   . Reflux   . Plantar fasciitis     Past Surgical History  Procedure Laterality Date  . Bilateral carpal tunnel release    . Right rotator cuff repair    . Nose surgery    . Colonoscopy  06/08/2011    Procedure: COLONOSCOPY;  Surgeon: Arlyce HarmanSandi M Fields, MD;  Location: [facility];  Service: Endoscopy;  Laterality: N/A;  11:15 AM    Family History   Problem Relation Age of Onset  . Colon cancer Neg Hx   . Hypertension Brother     Social History History  Substance Use Topics  . Smoking status: Former Smoker -- 1.00 packs/day for 10 years  . Smokeless tobacco: Not on file  . Alcohol Use: No    Allergies  Allergen Reactions  . Latex Rash    Reaction : mouth sores    Current Outpatient Prescriptions  Medication Sig Dispense Refill  . celecoxib (CELEBREX) 100 MG capsule Take 1 capsule (100 mg total) by mouth daily. 30 capsule 5  . doxycycline (VIBRA-TABS) 100 MG tablet Take 1 tablet (100 mg total) by mouth 2 (two) times daily. 20 tablet 0  . enalapril (VASOTEC) 10 MG tablet TAKE ONE TABLET BY MOUTH ONCE DAILY 30 tablet 5  . escitalopram (LEXAPRO) 10 MG tablet Take 1 tablet (10 mg total) by mouth daily. 30 tablet 6  . HYDROcodone-acetaminophen (NORCO) 7.5-325 MG per tablet TAKE 1/2 TO 1 TABLET BID PRN **MUST LAST 30 DAYS BETWEEN REFILLS** 60 tablet 0  . LORazepam (ATIVAN) 1 MG tablet Take 1 tablet (1 mg total) by mouth at bedtime as needed for sleep. 30 tablet 0  . pantoprazole (PROTONIX) 40 MG tablet TAKE ONE TABLET BY MOUTH ONCE DAILY 30 tablet 5   No current facility-administered medications for this visit.  Physical Exam Blood pressure 116/81, height  (1.803 m), weight 226 lb (102.513 kg). Physical Exam His appearance is normal is well-groomed has normal hygiene. He is oriented 3. Mood normal. Gait normal.  Lumbar spine tenderness is noted. Muscle tension muscle tone normal. Range of motion not tested stability normal skin normal.  Distally in his extremities lower extremities we find normal sensation and normal pulses with a weak left knee jerk reflex.  Corresponding lymph nodes are negative.  Upper extremities are normal  Right knee has medial and lateral joint line tenderness range of motion is full no effusion ligament stable motor exam normal  Left knee medial and lateral joint line tenderness  no effusion. Full range of motion. Ligaments are stable. Motor exam normal. McMurray sign is positive.  Data Reviewed His plain knee films looks normal independently reviewed report reviewed  Assessment    Encounter Diagnoses  Name Primary?  . Meniscus, medial, derangement, left Yes  . Left knee pain   . Leg numbness   . Chronic back pain         Plan    Recommend MRI left knee continue Celebrex. Call patient with results.

## 2014-08-06 NOTE — Patient Instructions (Signed)
We will schedule MRI for you and call result

## 2014-08-14 ENCOUNTER — Ambulatory Visit: Payer: BC Managed Care – PPO | Admitting: Family Medicine

## 2014-08-19 ENCOUNTER — Ambulatory Visit (HOSPITAL_COMMUNITY)
Admission: RE | Admit: 2014-08-19 | Discharge: 2014-08-19 | Disposition: A | Discharge status: Home / Self Care | Payer: 59 | Source: Ambulatory Visit | Attending: Orthopedic Surgery | Admitting: Orthopedic Surgery

## 2014-08-19 DIAGNOSIS — M25562 Pain in left knee: Secondary | ICD-10-CM | POA: Diagnosis present

## 2014-08-19 DIAGNOSIS — M23304 Other meniscus derangements, unspecified medial meniscus, left knee: Secondary | ICD-10-CM | POA: Diagnosis not present

## 2014-08-20 ENCOUNTER — Encounter: Payer: Self-pay | Admitting: Family Medicine

## 2014-08-20 ENCOUNTER — Ambulatory Visit (INDEPENDENT_AMBULATORY_CARE_PROVIDER_SITE_OTHER): Payer: 59 | Admitting: Family Medicine

## 2014-08-20 VITALS — BP 124/88 | Temp 98.1°F | Ht 71.0 in | Wt 222.2 lb

## 2014-08-20 DIAGNOSIS — T148 Other injury of unspecified body region: Secondary | ICD-10-CM

## 2014-08-20 DIAGNOSIS — T148XXA Other injury of unspecified body region, initial encounter: Secondary | ICD-10-CM

## 2014-08-20 NOTE — Progress Notes (Signed)
   Subjective:    Patient ID: Vernon Fisher, male    DOB: [birth year], 59 y.o.   MRN: [id number]  Fall The accident occurred 12 to 24 hours ago. The fall occurred while standing. He fell from an unknown height. He landed on concrete. The volume of blood lost was minimal. The point of impact was the head. The pain is at a severity of 5/10. The pain is moderate. Treatments tried: hydrocodone. The treatment provided mild relief.   Patient states that he has no other concerns at this time.   Slipped and fell on thre ice.  Struck the back of the head and ijured po  Ears ringing as loud as possible, diminished energy  No vom no throwing up  Experiencing a poundng pain    Review of Systems  no vomiting no loss of consciousness no confusion no chest pain no shortness of breath ROS otherwise negative    Objective:   Physical Exam  alert no acute distress. Lungs clear. Heart rare rhythm. Left posterior hip tenderness to deep palpation. Right posterior skull tenderness with slight mild hematoma noted       Assessment & Plan:   impression multiple contusions post fall plan since Medicare discussed. No need for x-rays. Warning signs discussed. WSL

## 2014-08-21 ENCOUNTER — Telehealth: Payer: Self-pay | Admitting: Orthopedic Surgery

## 2014-08-21 NOTE — Telephone Encounter (Signed)
IMPRESSION: 1. Mild complex tear of the posterior horn of the medial meniscus with a linear component extending to the inferior articular surface. 2. Mild partial thickness cartilage loss of the medial patellofemoral compartment and medial femorotibial compartment.  SURGERY VS NON OP TREATMENT   HE OPTED FOR SURGERY

## 2014-08-23 ENCOUNTER — Other Ambulatory Visit: Payer: Self-pay | Admitting: *Deleted

## 2014-08-26 ENCOUNTER — Telehealth: Payer: Self-pay | Admitting: Orthopedic Surgery

## 2014-08-26 NOTE — Telephone Encounter (Signed)
Regarding out-patient surgery at [facility] scheduled 09/06/14, contacted [employer] [facility], [phone]; per representative Magdalene RiverFrancis F, no pre-authorization required(REF#[id number] - need only the referral # issued by PCP on the claim,as noted in patient's appointment information.

## 2014-09-02 NOTE — Patient Instructions (Signed)
Eveline KetoRobert L Creed  09/02/2014   Your procedure is scheduled on:  09/06/2014  [facility] to [facility] at  615  AM.  Call this number if you have problems the morning of surgery: [phone]   Remember:   Do not eat food or drink liquids after midnight.   Take these medicines the morning of surgery with A SIP OF WATER:  Celebrex, enalapril, lexapro, hydrocodone, protonix   Do not wear jewelry, make-up or nail polish.  Do not wear lotions, powders, or perfumes.   Do not shave 48 hours prior to surgery. Men may shave face and neck.  Do not bring valuables to the hospital.  [facility] is not responsible for any belongings or valuables.               Contacts, dentures or bridgework may not be worn into surgery.  Leave suitcase in the car. After surgery it may be brought to your room.  For patients admitted to the hospital, discharge time is determined by your treatment team.               Patients discharged the day of surgery will not be allowed to drive home.  Name and phone number of your driver: family  Special Instructions: Shower using CHG 2 nights before surgery and the night before surgery.  If you shower the day of surgery use CHG.  Use special wash - you have one bottle of CHG for all showers.  You should use approximately 1/3 of the bottle for each shower.   Please read over the following fact sheets that you were given: Pain Booklet, Coughing and Deep Breathing, Surgical Site Infection Prevention, Anesthesia Post-op Instructions and Care and Recovery After Surgery Arthroscopic Procedure, Knee An arthroscopic procedure can find what is wrong with your knee. PROCEDURE Arthroscopy is a surgical technique that allows your orthopedic surgeon to diagnose and treat your knee injury with accuracy. They will look into your knee through a small instrument. This is almost like a small (pencil sized) telescope. Because arthroscopy affects your knee less than open knee surgery, you can  anticipate a more rapid recovery. Taking an active role by following your caregiver's instructions will help with rapid and complete recovery. Use crutches, rest, elevation, ice, and knee exercises as instructed. The length of recovery depends on various factors including type of injury, age, physical condition, medical conditions, and your rehabilitation. Your knee is the joint between the large bones (femur and tibia) in your leg. Cartilage covers these bone ends which are smooth and slippery and allow your knee to bend and move smoothly. Two menisci, thick, semi-lunar shaped pads of cartilage which form a rim inside the joint, help absorb shock and stabilize your knee. Ligaments bind the bones together and support your knee joint. Muscles move the joint, help support your knee, and take stress off the joint itself. Because of this all programs and physical therapy to rehabilitate an injured or repaired knee require rebuilding and strengthening your muscles. AFTER THE PROCEDURE  After the procedure, you will be moved to a recovery area until most of the effects of the medication have worn off. Your caregiver will discuss the test results with you.  Only take over-the-counter or prescription medicines for pain, discomfort, or fever as directed by your caregiver. SEEK MEDICAL CARE IF:   You have increased bleeding from your wounds.  You see redness, swelling, or have increasing pain in your wounds.  You have  pus coming from your wound.  You have an oral temperature above 102 F (38.9 C).  You notice a bad smell coming from the wound or dressing.  You have severe pain with any motion of your knee. SEEK IMMEDIATE MEDICAL CARE IF:   You develop a rash.  You have difficulty breathing.  You have any allergic problems. Document Released: 06/18/2000 Document Revised: 09/13/2011 Document Reviewed: 01/10/2008 [facility] Patient Information 2015 [employer] [place]. This information is not intended to  replace advice given to you by your health care provider. Make sure you discuss any questions you have with your health care provider. PATIENT INSTRUCTIONS POST-ANESTHESIA  IMMEDIATELY FOLLOWING SURGERY:  Do not drive or operate machinery for the first twenty four hours after surgery.  Do not make any important decisions for twenty four hours after surgery or while taking narcotic pain medications or sedatives.  If you develop intractable nausea and vomiting or a severe headache please notify your doctor immediately.  FOLLOW-UP:  Please make an appointment with your surgeon as instructed. You do not need to follow up with anesthesia unless specifically instructed to do so.  WOUND CARE INSTRUCTIONS (if applicable):  Keep a dry clean dressing on the anesthesia/puncture wound site if there is drainage.  Once the wound has quit draining you may leave it open to air.  Generally you should leave the bandage intact for twenty four hours unless there is drainage.  If the epidural site drains for more than 36-48 hours please call the anesthesia department.  QUESTIONS?:  Please feel free to call your physician or the hospital operator if you have any questions, and they will be happy to assist you.

## 2014-09-03 ENCOUNTER — Other Ambulatory Visit: Payer: Self-pay

## 2014-09-03 ENCOUNTER — Encounter (HOSPITAL_COMMUNITY): Payer: Self-pay

## 2014-09-03 ENCOUNTER — Encounter (HOSPITAL_COMMUNITY)
Admission: RE | Admit: 2014-09-03 | Discharge: 2014-09-03 | Disposition: A | Discharge status: Home / Self Care | Payer: 59 | Source: Ambulatory Visit | Attending: Orthopedic Surgery | Admitting: Orthopedic Surgery

## 2014-09-03 DIAGNOSIS — G43909 Migraine, unspecified, not intractable, without status migrainosus: Secondary | ICD-10-CM | POA: Diagnosis not present

## 2014-09-03 DIAGNOSIS — Z91040 Latex allergy status: Secondary | ICD-10-CM | POA: Diagnosis not present

## 2014-09-03 DIAGNOSIS — M94262 Chondromalacia, left knee: Secondary | ICD-10-CM | POA: Diagnosis present

## 2014-09-03 DIAGNOSIS — M797 Fibromyalgia: Secondary | ICD-10-CM | POA: Diagnosis not present

## 2014-09-03 DIAGNOSIS — Z87891 Personal history of nicotine dependence: Secondary | ICD-10-CM | POA: Diagnosis not present

## 2014-09-03 DIAGNOSIS — E119 Type 2 diabetes mellitus without complications: Secondary | ICD-10-CM | POA: Diagnosis not present

## 2014-09-03 DIAGNOSIS — M23304 Other meniscus derangements, unspecified medial meniscus, left knee: Secondary | ICD-10-CM | POA: Diagnosis not present

## 2014-09-03 DIAGNOSIS — K219 Gastro-esophageal reflux disease without esophagitis: Secondary | ICD-10-CM | POA: Diagnosis not present

## 2014-09-03 DIAGNOSIS — I10 Essential (primary) hypertension: Secondary | ICD-10-CM | POA: Diagnosis not present

## 2014-09-03 HISTORY — DX: Nausea with vomiting, unspecified: R11.2

## 2014-09-03 HISTORY — DX: Other specified postprocedural states: Z98.890

## 2014-09-03 HISTORY — DX: Depression, unspecified: F32.A

## 2014-09-03 HISTORY — DX: Major depressive disorder, single episode, unspecified: F32.9

## 2014-09-03 LAB — BASIC METABOLIC PANEL
ANION GAP: 6 (ref 5–15)
BUN: 14 mg/dL (ref 6–23)
CO2: 25 mmol/L (ref 19–32)
Calcium: 8.9 mg/dL (ref 8.4–10.5)
Chloride: 104 mmol/L (ref 96–112)
Creatinine, Ser: 0.76 mg/dL (ref 0.50–1.35)
GFR calc Af Amer: >90 mL/min (ref >90)
GLUCOSE: 160 mg/dL — AB (ref 70–99)
POTASSIUM: 3.6 mmol/L (ref 3.5–5.1)
Sodium: 135 mmol/L (ref 135–145)

## 2014-09-03 LAB — CBC
HCT: 47.1 % (ref 39.0–52.0)
Hemoglobin: 16.6 g/dL (ref 13.0–17.0)
MCH: 31.3 pg (ref 26.0–34.0)
MCHC: 35.2 g/dL (ref 30.0–36.0)
MCV: 88.7 fL (ref 78.0–100.0)
Platelets: 174 10*3/uL (ref 150–400)
RBC: 5.31 MIL/uL (ref 4.22–5.81)
RDW: 12.5 % (ref 11.5–15.5)
WBC: 7.6 10*3/uL (ref 4.0–10.5)

## 2014-09-03 NOTE — Pre-Procedure Instructions (Signed)
Patient given information to sign up for my chart at home. 

## 2014-09-06 ENCOUNTER — Ambulatory Visit (HOSPITAL_COMMUNITY)
Admission: RE | Admit: 2014-09-06 | Discharge: 2014-09-06 | Disposition: A | Discharge status: Home / Self Care | Payer: 59 | Source: Ambulatory Visit | Attending: Orthopedic Surgery | Admitting: Orthopedic Surgery

## 2014-09-06 ENCOUNTER — Encounter (HOSPITAL_COMMUNITY)
Admission: RE | Disposition: A | Discharge status: Home / Self Care | Payer: Self-pay | Source: Ambulatory Visit | Attending: Orthopedic Surgery

## 2014-09-06 ENCOUNTER — Encounter (HOSPITAL_COMMUNITY): Payer: Self-pay | Admitting: *Deleted

## 2014-09-06 ENCOUNTER — Ambulatory Visit (HOSPITAL_COMMUNITY): Payer: 59 | Admitting: Anesthesiology

## 2014-09-06 DIAGNOSIS — M65869 Other synovitis and tenosynovitis, unspecified lower leg: Secondary | ICD-10-CM | POA: Insufficient documentation

## 2014-09-06 DIAGNOSIS — M23304 Other meniscus derangements, unspecified medial meniscus, left knee: Secondary | ICD-10-CM | POA: Insufficient documentation

## 2014-09-06 DIAGNOSIS — Z91040 Latex allergy status: Secondary | ICD-10-CM | POA: Insufficient documentation

## 2014-09-06 DIAGNOSIS — M94262 Chondromalacia, left knee: Secondary | ICD-10-CM | POA: Diagnosis not present

## 2014-09-06 DIAGNOSIS — E119 Type 2 diabetes mellitus without complications: Secondary | ICD-10-CM | POA: Insufficient documentation

## 2014-09-06 DIAGNOSIS — K219 Gastro-esophageal reflux disease without esophagitis: Secondary | ICD-10-CM | POA: Insufficient documentation

## 2014-09-06 DIAGNOSIS — M25562 Pain in left knee: Secondary | ICD-10-CM

## 2014-09-06 DIAGNOSIS — Z87891 Personal history of nicotine dependence: Secondary | ICD-10-CM | POA: Insufficient documentation

## 2014-09-06 DIAGNOSIS — M797 Fibromyalgia: Secondary | ICD-10-CM | POA: Insufficient documentation

## 2014-09-06 DIAGNOSIS — I10 Essential (primary) hypertension: Secondary | ICD-10-CM | POA: Insufficient documentation

## 2014-09-06 DIAGNOSIS — G43909 Migraine, unspecified, not intractable, without status migrainosus: Secondary | ICD-10-CM | POA: Insufficient documentation

## 2014-09-06 HISTORY — PX: KNEE ARTHROSCOPY WITH MEDIAL MENISECTOMY: SHX5651

## 2014-09-06 LAB — GLUCOSE, CAPILLARY
Glucose-Capillary: 137 mg/dL — ABNORMAL HIGH (ref 70–99)
Glucose-Capillary: 107 mg/dL — ABNORMAL HIGH (ref 70–99)

## 2014-09-06 SURGERY — ARTHROSCOPY, KNEE, WITH MEDIAL MENISCECTOMY
Anesthesia: General | Site: Knee | Laterality: Left

## 2014-09-06 MED ORDER — CHLORHEXIDINE GLUCONATE 4 % EX LIQD: 60.0000 mL | Freq: Once | CUTANEOUS | Status: DC

## 2014-09-06 MED ORDER — BUPIVACAINE-EPINEPHRINE (PF) 0.5% -1:200000 IJ SOLN
INTRAMUSCULAR | Status: AC
  Filled 2014-09-06: qty 60

## 2014-09-06 MED ORDER — ONDANSETRON HCL 4 MG/2ML IJ SOLN
4.0000 mg | Freq: Once | INTRAMUSCULAR | Status: AC
  Administered 2014-09-06: 4 mg via INTRAVENOUS
  Filled 2014-09-06: qty 2

## 2014-09-06 MED ORDER — PROPOFOL 10 MG/ML IV BOLUS
INTRAVENOUS | Status: DC | PRN
  Administered 2014-09-06: 140 mg via INTRAVENOUS

## 2014-09-06 MED ORDER — MIDAZOLAM HCL 5 MG/5ML IJ SOLN
INTRAMUSCULAR | Status: DC | PRN
  Administered 2014-09-06: 2 mg via INTRAVENOUS

## 2014-09-06 MED ORDER — SODIUM CHLORIDE 0.9 % IJ SOLN
INTRAMUSCULAR | Status: AC
  Filled 2014-09-06: qty 10

## 2014-09-06 MED ORDER — SODIUM CHLORIDE 0.9 % IR SOLN
Status: DC | PRN
  Administered 2014-09-06: 1000 mL

## 2014-09-06 MED ORDER — EPHEDRINE SULFATE 50 MG/ML IJ SOLN
INTRAMUSCULAR | Status: AC
  Filled 2014-09-06: qty 1

## 2014-09-06 MED ORDER — EPINEPHRINE HCL 1 MG/ML IJ SOLN
INTRAMUSCULAR | Status: AC
  Filled 2014-09-06: qty 5

## 2014-09-06 MED ORDER — ONDANSETRON HCL 4 MG/2ML IJ SOLN: 4.0000 mg | Freq: Once | INTRAMUSCULAR | Status: DC | PRN

## 2014-09-06 MED ORDER — LACTATED RINGERS IV SOLN
INTRAVENOUS | Status: DC
  Administered 2014-09-06: 1000 mL via INTRAVENOUS

## 2014-09-06 MED ORDER — PROPOFOL 10 MG/ML IV BOLUS
INTRAVENOUS | Status: AC
  Filled 2014-09-06: qty 20

## 2014-09-06 MED ORDER — CEFAZOLIN SODIUM-DEXTROSE 2-3 GM-% IV SOLR
2.0000 g | INTRAVENOUS | Status: AC
  Administered 2014-09-06: 2 g via INTRAVENOUS
  Filled 2014-09-06: qty 50

## 2014-09-06 MED ORDER — SCOPOLAMINE 1 MG/3DAYS TD PT72
1.0000 | MEDICATED_PATCH | Freq: Once | TRANSDERMAL | Status: DC
Start: 2014-09-06 — End: 2014-09-06
  Administered 2014-09-06: 1.5 mg via TRANSDERMAL
  Filled 2014-09-06: qty 1

## 2014-09-06 MED ORDER — KETOROLAC TROMETHAMINE 30 MG/ML IJ SOLN
30.0000 mg | Freq: Once | INTRAMUSCULAR | Status: AC
  Administered 2014-09-06: 30 mg via INTRAVENOUS
  Filled 2014-09-06: qty 1

## 2014-09-06 MED ORDER — FENTANYL CITRATE 0.05 MG/ML IJ SOLN
INTRAMUSCULAR | Status: DC | PRN
  Administered 2014-09-06 (×2): 50 ug via INTRAVENOUS

## 2014-09-06 MED ORDER — FENTANYL CITRATE 0.05 MG/ML IJ SOLN
25.0000 ug | INTRAMUSCULAR | Status: AC
  Administered 2014-09-06 (×2): 25 ug via INTRAVENOUS
  Filled 2014-09-06: qty 2

## 2014-09-06 MED ORDER — EPHEDRINE SULFATE 50 MG/ML IJ SOLN
INTRAMUSCULAR | Status: DC | PRN
  Administered 2014-09-06: 10 mg via INTRAVENOUS

## 2014-09-06 MED ORDER — FENTANYL CITRATE 0.05 MG/ML IJ SOLN
INTRAMUSCULAR | Status: AC
  Filled 2014-09-06: qty 5

## 2014-09-06 MED ORDER — ONDANSETRON HCL 4 MG/2ML IJ SOLN
4.0000 mg | Freq: Once | INTRAMUSCULAR | Status: AC
Start: 2014-09-06 — End: 2014-09-06
  Administered 2014-09-06: 4 mg via INTRAVENOUS
  Filled 2014-09-06: qty 2

## 2014-09-06 MED ORDER — DEXAMETHASONE SODIUM PHOSPHATE 4 MG/ML IJ SOLN
4.0000 mg | Freq: Once | INTRAMUSCULAR | Status: AC
  Administered 2014-09-06: 4 mg via INTRAVENOUS
  Filled 2014-09-06: qty 1

## 2014-09-06 MED ORDER — HYDROCODONE-ACETAMINOPHEN 7.5-325 MG PO TABS: ORAL_TABLET | ORAL | Status: DC

## 2014-09-06 MED ORDER — MIDAZOLAM HCL 2 MG/2ML IJ SOLN
INTRAMUSCULAR | Status: AC
  Filled 2014-09-06: qty 2

## 2014-09-06 MED ORDER — HYDROCODONE-ACETAMINOPHEN 5-325 MG PO TABS
1.0000 | ORAL_TABLET | Freq: Once | ORAL | Status: AC
  Administered 2014-09-06: 1 via ORAL
  Filled 2014-09-06: qty 1

## 2014-09-06 MED ORDER — LIDOCAINE HCL 1 % IJ SOLN
INTRAMUSCULAR | Status: DC | PRN
  Administered 2014-09-06: 30 mg via INTRADERMAL

## 2014-09-06 MED ORDER — FENTANYL CITRATE 0.05 MG/ML IJ SOLN: 25.0000 ug | INTRAMUSCULAR | Status: DC | PRN

## 2014-09-06 MED ORDER — MIDAZOLAM HCL 2 MG/2ML IJ SOLN
1.0000 mg | INTRAMUSCULAR | Status: DC | PRN
Start: 2014-09-06 — End: 2014-09-06
  Administered 2014-09-06: 2 mg via INTRAVENOUS
  Filled 2014-09-06: qty 2

## 2014-09-06 MED ORDER — SODIUM CHLORIDE 0.9 % IR SOLN
Status: DC | PRN
  Administered 2014-09-06 (×2): 3000 mL

## 2014-09-06 MED ORDER — BUPIVACAINE-EPINEPHRINE (PF) 0.5% -1:200000 IJ SOLN
INTRAMUSCULAR | Status: DC | PRN
  Administered 2014-09-06: 60 mL via PERINEURAL

## 2014-09-06 SURGICAL SUPPLY — 56 items
ARTHROWAND PARAGON T2 (SURGICAL WAND)
BAG HAMPER (MISCELLANEOUS) ×2 IMPLANT
BANDAGE ELASTIC 6 VELCRO NS (GAUZE/BANDAGES/DRESSINGS) ×2 IMPLANT
BLADE 11 SAFETY STRL DISP (BLADE) ×1 IMPLANT
BLADE AGGRESSIVE PLUS 4.0 (BLADE) ×2 IMPLANT
BLADE SURG SZ11 CARB STEEL (BLADE) ×1 IMPLANT
CHLORAPREP W/TINT 26ML (MISCELLANEOUS) ×3 IMPLANT
CLOTH BEACON ORANGE TIMEOUT ST (SAFETY) ×2 IMPLANT
COOLER CRYO IC GRAV AND TUBE (ORTHOPEDIC SUPPLIES) ×2 IMPLANT
CUFF CRYO KNEE LG 20X31 COOLER (ORTHOPEDIC SUPPLIES) IMPLANT
CUFF CRYO KNEE18X23 MED (MISCELLANEOUS) ×1 IMPLANT
CUFF TOURNIQUET SINGLE 34IN LL (TOURNIQUET CUFF) ×1 IMPLANT
CUFF TOURNIQUET SINGLE 44IN (TOURNIQUET CUFF) IMPLANT
CUTTER ANGLED DBL BITE 4.5 (BURR) IMPLANT
DECANTER SPIKE VIAL GLASS SM (MISCELLANEOUS) ×4 IMPLANT
GAUZE SPONGE 4X4 12PLY STRL (GAUZE/BANDAGES/DRESSINGS) ×2 IMPLANT
GAUZE SPONGE 4X4 16PLY XRAY LF (GAUZE/BANDAGES/DRESSINGS) ×2 IMPLANT
GAUZE XEROFORM 5X9 LF (GAUZE/BANDAGES/DRESSINGS) ×2 IMPLANT
GLOVE BIOGEL PI IND STRL 7.0 (GLOVE) IMPLANT
GLOVE BIOGEL PI INDICATOR 7.0 (GLOVE) ×1
GLOVE SKINSENSE NS SZ8.0 LF (GLOVE) ×1
GLOVE SKINSENSE STRL SZ8.0 LF (GLOVE) ×1 IMPLANT
GLOVE SS N UNI LF 8.5 STRL (GLOVE) ×2 IMPLANT
GLOVE SURG SS PI 7.0 STRL IVOR (GLOVE) ×1 IMPLANT
GOWN STRL REUS W/TWL LRG LVL3 (GOWN DISPOSABLE) ×6 IMPLANT
GOWN STRL REUS W/TWL XL LVL3 (GOWN DISPOSABLE) ×2 IMPLANT
HLDR LEG FOAM (MISCELLANEOUS) ×1 IMPLANT
IV NS IRRIG 3000ML ARTHROMATIC (IV SOLUTION) ×4 IMPLANT
KIT BLADEGUARD II DBL (SET/KITS/TRAYS/PACK) ×2 IMPLANT
KIT ROOM TURNOVER AP CYSTO (KITS) ×2 IMPLANT
LEG HOLDER FOAM (MISCELLANEOUS) ×1
MANIFOLD NEPTUNE II (INSTRUMENTS) ×2 IMPLANT
MARKER SKIN DUAL TIP RULER LAB (MISCELLANEOUS) ×2 IMPLANT
NDL HYPO 18GX1.5 BLUNT FILL (NEEDLE) ×1 IMPLANT
NDL HYPO 21X1.5 SAFETY (NEEDLE) ×1 IMPLANT
NDL SPNL 18GX3.5 QUINCKE PK (NEEDLE) ×1 IMPLANT
NEEDLE HYPO 18GX1.5 BLUNT FILL (NEEDLE) ×2 IMPLANT
NEEDLE HYPO 21X1.5 SAFETY (NEEDLE) ×2 IMPLANT
NEEDLE SPNL 18GX3.5 QUINCKE PK (NEEDLE) ×2 IMPLANT
NS IRRIG 1000ML POUR BTL (IV SOLUTION) ×2 IMPLANT
PACK ARTHRO LIMB DRAPE STRL (MISCELLANEOUS) ×2 IMPLANT
PAD ABD 5X9 TENDERSORB (GAUZE/BANDAGES/DRESSINGS) ×2 IMPLANT
PAD ARMBOARD 7.5X6 YLW CONV (MISCELLANEOUS) ×2 IMPLANT
PADDING CAST COTTON 6X4 STRL (CAST SUPPLIES) ×2 IMPLANT
PADDING WEBRIL 6 STERILE (GAUZE/BANDAGES/DRESSINGS) ×1 IMPLANT
SET ARTHROSCOPY INST (INSTRUMENTS) ×2 IMPLANT
SET ARTHROSCOPY PUMP TUBE (IRRIGATION / IRRIGATOR) ×2 IMPLANT
SET BASIN LINEN APH (SET/KITS/TRAYS/PACK) ×2 IMPLANT
SPONGE GAUZE 4X4 12PLY (GAUZE/BANDAGES/DRESSINGS) ×1 IMPLANT
SUT ETHILON 3 0 FSL (SUTURE) IMPLANT
SYR 30ML LL (SYRINGE) ×2 IMPLANT
SYRINGE 10CC LL (SYRINGE) ×2 IMPLANT
WAND 50 DEG COVAC W/CORD (SURGICAL WAND) IMPLANT
WAND 90 DEG TURBOVAC W/CORD (SURGICAL WAND) IMPLANT
WAND ARTHRO PARAGON T2 (SURGICAL WAND) IMPLANT
YANKAUER SUCT BULB TIP 10FT TU (MISCELLANEOUS) ×6 IMPLANT

## 2014-09-06 NOTE — Op Note (Signed)
09/06/2014  8:18 AM  PATIENT:  Vernon Fisher  59 y.o. male  PRE-OPERATIVE DIAGNOSIS:  LEFT knee MEDIAL MENISCUS TEAR  POST-OPERATIVE DIAGNOSIS:  Synovitis left knee  PROCEDURE:  Procedure(s): KNEE ARTHROSCOPY WITH LIMITED DEBRIDEMENT (Left)  SURGEON:  Surgeon(s) and Role:    * Vickki HearingStanley E Oneida Mckamey, MD - Primary  PHYSICIAN ASSISTANT:   ASSISTANTS: none   ANESTHESIA:   general  EBL:  Total I/O In: 600 [I.V.:600] Out: -   BLOOD ADMINISTERED:none  DRAINS: none   LOCAL MEDICATIONS USED:  MARCAINE   , Amount: 60 ml and OTHER epi  SPECIMEN:  No Specimen  DISPOSITION OF SPECIMEN:  N/A  COUNTS:  YES  TOURNIQUET:    DICTATION: .Dragon Dictation  PLAN OF CARE: Discharge to home after PACU  PATIENT DISPOSITION:  PACU - hemodynamically stable.   Delay start of Pharmacological VTE agent (>24hrs) due to surgical blood loss or risk of bleeding: not applicable  Knee arthroscopy dictation  The patient was identified in the preoperative holding area using 2 approved identification mechanisms. The chart was reviewed and updated. The surgical site was confirmed and marked with an indelible marker.  The patient was taken to the operating room for anesthesia. After successful  General LMA  anesthesia, 2 GM ANCEF was used as IV antibiotics.  The patient was placed in the supine position with the (LEFT KNEE) operative extremity in an arthroscopic leg holder and the opposite extremity in a padded leg holder.  The timeout was executed.  A lateral portal was established with an 11 blade and the scope was introduced into the joint. A diagnostic arthroscopy was performed in circumferential manner examining the entire knee joint. A medial portal was established and the diagnostic arthroscopy was repeated using a probe to palpate intra-articular structures as they were encountered.   The operative findings are   Medial MILD CHONDROMALACIA , FREE EDGE DEGENERATION OF MEDIAL  MENISCUS Lateral NORMAL Patellofemoral NORMAL Notch FRAYING OF ACL    The MEDIAL meniscus FREE EDGE FRAYING was resected using a  motorized shaver.   The arthroscopic pump was placed on the wash mode and any excess debris was removed from the joint using suction.  60 cc of Marcaine with epinephrine was injected through the arthroscope.  The portals were closed with 3-0 nylon suture.  A sterile bandage, Ace wrap and Cryo/Cuff was placed and the Cryo/Cuff was activated. The patient was taken to the recovery room in stable condition.

## 2014-09-06 NOTE — Transfer of Care (Signed)
Immediate Anesthesia Transfer of Care Note  Patient: Vernon KetoRobert L Ronan  Procedure(s) Performed: Procedure(s): KNEE ARTHROSCOPY WITH LIMITED DEBRIDEMENT (Left)  Patient Location: PACU  Anesthesia Type:General  Level of Consciousness: awake, alert , oriented and patient cooperative  Airway & Oxygen Therapy: Patient Spontanous Breathing and Patient connected to face mask oxygen  Post-op Assessment: Report given to RN, Post -op Vital signs reviewed and stable and Patient moving all extremities  Post vital signs: Reviewed and stable  Last Vitals:  Filed Vitals:   09/06/14 0715  BP: 108/75  Pulse:   Temp:   Resp: 14    Complications: No apparent anesthesia complications

## 2014-09-06 NOTE — Anesthesia Postprocedure Evaluation (Signed)
  Anesthesia Post-op Note  Patient: Vernon KetoRobert L Chabot  Procedure(s) Performed: Procedure(s): KNEE ARTHROSCOPY WITH LIMITED DEBRIDEMENT (Left)  Patient Location: PACU  Anesthesia Type:General  Level of Consciousness: awake, alert , oriented and patient cooperative  Airway and Oxygen Therapy: Patient Spontanous Breathing  Post-op Pain: 3 /10, mild  Post-op Assessment: Post-op Vital signs reviewed, Patient's Cardiovascular Status Stable, Respiratory Function Stable, Patent Airway, No signs of Nausea or vomiting and No headache  Post-op Vital Signs: Reviewed and stable  Last Vitals:  Filed Vitals:   09/06/14 0830  BP: 114/63  Pulse: 70  Temp: 36.6 C  Resp: 12    Complications: No apparent anesthesia complications

## 2014-09-06 NOTE — Discharge Instructions (Signed)
Arthroscopic Procedure, Knee, Care After °Refer to this sheet in the next few weeks. These discharge instructions provide you with general information on caring for yourself after you leave the hospital. Your health care provider may also give you specific instructions. Your treatment has been planned according to the most current medical practices available, but unavoidable complications sometimes occur. If you have any problems or questions after discharge, please call your health care provider. °HOME CARE INSTRUCTIONS  °· It is normal to be sore for a couple days after surgery. See your health care provider if this seems to be getting worse rather than better. °· Only take over-the-counter or prescription medicines for pain, discomfort, or fever as directed by your health care provider. °· Take showers rather than baths, or as directed by your health care provider. °· Change bandages (dressings) if necessary or as directed. °· You may resume normal diet and activities as directed or allowed. °· Avoid lifting and driving until you are directed otherwise. °· Make an appointment to see your health care provider for stitches (suture) or staple removal as directed. °· You may put ice on the area. °· Put the ice in a plastic bag. Place a towel between your skin and the bag. °· Leave the ice on for 15-20 minutes, three to four times per day for the first 2 days. °· Elevate the knee above the level of your heart to reduce swelling, and avoid dangling the leg. °· Do 10-15 ankle pumps (pointing your toes toward you and then away from you) two to three times daily. °· If you are given compression stockings to wear after surgery, use them for as long as your surgeon tells you (around 10-14 days). °· Avoid smoking and exposure to secondhand smoke. °SEEK MEDICAL CARE IF:  °· You have increased bleeding from your wounds. °· You see redness or swelling or you have increasing pain in your wounds. °· You have pus coming from your  wound. °· You have a fever or persistent symptoms for more than 2-3 days. °· You notice a bad smell coming from the wound or dressing. °· You have severe pain with any motion of your knee. °SEEK IMMEDIATE MEDICAL CARE IF:  °· You develop a rash. °· You have difficulty breathing. °· You develop any reaction or side effects to medicines taken. °· You develop pain in the calves or back of the knee. °· You develop chest pain, shortness of breath, or difficulty breathing. °· You develop numbness or tingling in the leg or foot. °MAKE SURE YOU:  °· Understand these instructions. °· Will watch your condition. °· Will get help right away if you are not doing well or you get worse. °Document Released: 01/08/2005 Document Revised: 06/26/2013 Document Reviewed: 11/16/2012 °ExitCare® Patient Information ©2015 [employer]. This information is not intended to replace advice given to you by your health care provider. Make sure you discuss any questions you have with your health care provider. ° °

## 2014-09-06 NOTE — Brief Op Note (Signed)
09/06/2014  8:18 AM  PATIENT:  Vernon Fisher  59 y.o. male  PRE-OPERATIVE DIAGNOSIS:  LEFT knee MEDIAL MENISCUS TEAR  POST-OPERATIVE DIAGNOSIS:  Synovitis left knee  PROCEDURE:  Procedure(s): KNEE ARTHROSCOPY WITH LIMITED DEBRIDEMENT (Left)  SURGEON:  Surgeon(s) and Role:    * Lether Tesch E Rishon Thilges, MD - Primary  PHYSICIAN ASSISTANT:   ASSISTANTS: none   ANESTHESIA:   general  EBL:  Total I/O In: 600 [I.V.:600] Out: -   BLOOD ADMINISTERED:none  DRAINS: none   LOCAL MEDICATIONS USED:  MARCAINE   , Amount: 60 ml and OTHER epi  SPECIMEN:  No Specimen  DISPOSITION OF SPECIMEN:  N/A  COUNTS:  YES  TOURNIQUET:    DICTATION: .Dragon Dictation  PLAN OF CARE: Discharge to home after PACU  PATIENT DISPOSITION:  PACU - hemodynamically stable.   Delay start of Pharmacological VTE agent (>24hrs) due to surgical blood loss or risk of bleeding: not applicable  Knee arthroscopy dictation  The patient was identified in the preoperative holding area using 2 approved identification mechanisms. The chart was reviewed and updated. The surgical site was confirmed and marked with an indelible marker.  The patient was taken to the operating room for anesthesia. After successful  General LMA  anesthesia, 2 GM ANCEF was used as IV antibiotics.  The patient was placed in the supine position with the (LEFT KNEE) operative extremity in an arthroscopic leg holder and the opposite extremity in a padded leg holder.  The timeout was executed.  A lateral portal was established with an 11 blade and the scope was introduced into the joint. A diagnostic arthroscopy was performed in circumferential manner examining the entire knee joint. A medial portal was established and the diagnostic arthroscopy was repeated using a probe to palpate intra-articular structures as they were encountered.   The operative findings are   Medial MILD CHONDROMALACIA , FREE EDGE DEGENERATION OF MEDIAL  MENISCUS Lateral NORMAL Patellofemoral NORMAL Notch FRAYING OF ACL    The MEDIAL meniscus FREE EDGE FRAYING was resected using a  motorized shaver.   The arthroscopic pump was placed on the wash mode and any excess debris was removed from the joint using suction.  60 cc of Marcaine with epinephrine was injected through the arthroscope.  The portals were closed with 3-0 nylon suture.  A sterile bandage, Ace wrap and Cryo/Cuff was placed and the Cryo/Cuff was activated. The patient was taken to the recovery room in stable condition.  

## 2014-09-06 NOTE — Interval H&P Note (Signed)
History and Physical Interval Note:  09/06/2014 7:16 AM  Vernon Fisher  has presented today for surgery, with the diagnosis of LEFT MEDIAL MENISCUS TEAR  The various methods of treatment have been discussed with the patient and family. After consideration of risks, benefits and other options for treatment, the patient has consented to  Procedure(s): KNEE ARTHROSCOPY WITH MEDIAL MENISECTOMY (Left) as a surgical intervention .  The patient's history has been reviewed, patient examined, no change in status, stable for surgery.  I have reviewed the patient's chart and labs.  Questions were answered to the patient's satisfaction.     Fuller CanadaStanley Manya Balash

## 2014-09-06 NOTE — Anesthesia Preprocedure Evaluation (Signed)
Anesthesia Evaluation  Patient identified by MRN, date of birth, ID band Patient awake    Reviewed: Allergy & Precautions, NPO status , Patient's Chart, lab work & pertinent test results  History of Anesthesia Complications (+) PONV and history of anesthetic complications  Airway Mallampati: II  TM Distance: >3 FB     Dental  (+) Teeth Intact, Dental Advisory Given   Pulmonary former smoker,  breath sounds clear to auscultation        Cardiovascular hypertension, Pt. on medications Rhythm:Regular Rate:Normal     Neuro/Psych  Headaches, PSYCHIATRIC DISORDERS Depression    GI/Hepatic GERD-  Medicated and Controlled,  Endo/Other  diabetes, Type 2  Renal/GU      Musculoskeletal  (+) Arthritis -, Fibromyalgia -, narcotic dependent  Abdominal   Peds  Hematology   Anesthesia Other Findings Chronic pain   Reproductive/Obstetrics                             Anesthesia Physical Anesthesia Plan  ASA: III  Anesthesia Plan: General   Post-op Pain Management:    Induction: Intravenous  Airway Management Planned: LMA  Additional Equipment:   Intra-op Plan:   Post-operative Plan: Extubation in OR  Informed Consent: I have reviewed the patients History and Physical, chart, labs and discussed the procedure including the risks, benefits and alternatives for the proposed anesthesia with the patient or authorized representative who has indicated his/her understanding and acceptance.     Plan Discussed with:   Anesthesia Plan Comments:         Anesthesia Quick Evaluation

## 2014-09-06 NOTE — H&P (Signed)
Patient ID: Vernon Fisher, male   DOB: 1955-10-11, 59 y.o.   MRN: [id number]    Chief Complaint   Patient presents with   .  Knee Pain       left knee pain x 5 weeks, no known injury     HPI Vernon Fisher is a 59 y.o. male.  Her presents with a 5 month history of left knee pain. He denies any trauma. He says many weeks back he was crawling in his crawl space and felt something weird in his knee but it went away and then it came back about 5 weeks later. He now has throbbing burning stabbing medial knee pain constant) 5 and 10 intensity and is unrelieved by Celebrex for the last 6 weeks. He has had difficulty weightbearing. No range of motion loss. HPI  Review of Systems Review of Systems  Night sweats and fatigue, ringing of the years with sinus problems and sore throat. Vision problems. Shortness of breath cough and wheezing. Leg edema. Abdominal pain diarrhea nausea. Excess nighttime and frequent urination. Depression. He also has numbness tingling burning pain in his legs related to his chronic back pain. This is bilateral. He also has dizziness weakness tremors and lightheadedness. Skin rash and redness. Joint pain limb pain muscle weakness stiff joints swollen joints back pain as stated  All other systems were reviewed and were negative. Past Medical History   Diagnosis  Date   .  GERD (gastroesophageal reflux disease)     .  Hypertension     .  Diabetes mellitus without complication     .  Migraine headache     .  Degenerative disc disease     .  Chronic pain     .  Fibromyalgia     .  Reflux     .  Plantar fasciitis         Past Surgical History   Procedure  Laterality  Date   .  Bilateral carpal tunnel release       .  Right rotator cuff repair       .  Nose surgery       .  Colonoscopy    06/08/2011       Procedure: COLONOSCOPY;  Surgeon: [name], MD;  Location: [facility];  Service: Endoscopy;  Laterality: N/A;  11:15 AM       Family History   Problem   Relation  Age of Onset   .  Colon cancer  Neg Hx     .  Hypertension  Brother       Social History History   Substance Use Topics   .  Smoking status:  Former Smoker -- 1.00 packs/day for 10 years   .  Smokeless tobacco:  Not on file   .  Alcohol Use:  No       Allergies   Allergen  Reactions   .  Latex  Rash       Reaction : mouth sores       Current Outpatient Prescriptions   Medication  Sig  Dispense  Refill   .  celecoxib (CELEBREX) 100 MG capsule  Take 1 capsule (100 mg total) by mouth daily.  30 capsule  5   .  doxycycline (VIBRA-TABS) 100 MG tablet  Take 1 tablet (100 mg total) by mouth 2 (two) times daily.  20 tablet  0   .  enalapril (VASOTEC) 10 MG tablet  TAKE  ONE TABLET BY MOUTH ONCE DAILY  30 tablet  5   .  escitalopram (LEXAPRO) 10 MG tablet  Take 1 tablet (10 mg total) by mouth daily.  30 tablet  6   .  HYDROcodone-acetaminophen (NORCO) 7.5-325 MG per tablet  TAKE 1/2 TO 1 TABLET BID PRN **MUST LAST 30 DAYS BETWEEN REFILLS**  60 tablet  0   .  LORazepam (ATIVAN) 1 MG tablet  Take 1 tablet (1 mg total) by mouth at bedtime as needed for sleep.  30 tablet  0   .  pantoprazole (PROTONIX) 40 MG tablet  TAKE ONE TABLET BY MOUTH ONCE DAILY  30 tablet  5      No current facility-administered medications for this visit.        Physical Exam Blood pressure 116/81, height 5\' 11"  (1.803 m), weight 226 lb (102.513 kg). Physical Exam His appearance is normal is well-groomed has normal hygiene. He is oriented 3. Mood normal. Gait normal.  Lumbar spine tenderness is noted. Muscle tension muscle tone normal. Range of motion not tested stability normal skin normal.  Distally in his extremities lower extremities we find normal sensation and normal pulses with a weak left knee jerk reflex.  Corresponding lymph nodes are negative.  Upper extremities are normal  Right knee has medial and lateral joint line tenderness range of motion is full no effusion ligament stable motor  exam normal  Left knee medial and lateral joint line tenderness no effusion. Full range of motion. Ligaments are stable. Motor exam normal. McMurray sign is positive.  Data Reviewed His plain knee films looks normal independently reviewed report reviewed  Assessment    Encounter Diagnoses   Name  Primary?   .  Meniscus, medial, derangement, left  Yes   .  Left knee pain     .  Leg numbness     .  Chronic back pain           Plan  SALK MEDIAL MENISECTOMY EXAM: MRI OF THE LEFT KNEE WITHOUT CONTRAST   TECHNIQUE: Multiplanar, multisequence MR imaging of the knee was performed. No intravenous contrast was administered.   COMPARISON:  None.   FINDINGS: MENISCI   Medial meniscus: Mild complex tear of the posterior horn of the medial meniscus with a linear component extending to the inferior articular surface.   Lateral meniscus:  Intact.   LIGAMENTS   Cruciates:  Intact ACL and PCL.   Collaterals: Medial collateral ligament is intact. Lateral collateral ligament complex is intact.   CARTILAGE   Patellofemoral: Mild partial thickness cartilage loss of the medial patella femoral compartment.   Medial: Mild partial thickness cartilage loss of the medial femorotibial compartment.   Lateral:  No focal chondral defect.   Joint: No joint effusion. No plical thickening. Normal Hoffa's fat.   Popliteal Fossa:  Trace Baker's cyst.  Intact popliteus tendon.   Extensor Mechanism:  Intact.   Bones: No focal marrow signal abnormality. No fracture or dislocation.   Soft tissue: There is a multiloculated cystic structure adjacent to the medial gastrocnemius origin likely representing a ganglion cyst measuring approximately 13 x 15 x 9 mm.   IMPRESSION: 1. Mild complex tear of the posterior horn of the medial meniscus with a linear component extending to the inferior articular surface. 2. Mild partial thickness cartilage loss of the medial patellofemoral compartment  and medial femorotibial compartment.     Electronically Signed   By: Elige KoHetal  Patel   On: 08/19/2014 12:49

## 2014-09-09 ENCOUNTER — Ambulatory Visit (INDEPENDENT_AMBULATORY_CARE_PROVIDER_SITE_OTHER): Payer: 59 | Admitting: Orthopedic Surgery

## 2014-09-09 ENCOUNTER — Encounter: Payer: Self-pay | Admitting: Orthopedic Surgery

## 2014-09-09 VITALS — BP 144/92 | Ht 71.0 in | Wt 220.0 lb

## 2014-09-09 DIAGNOSIS — Z9889 Other specified postprocedural states: Secondary | ICD-10-CM

## 2014-09-09 MED ORDER — HYDROCODONE-ACETAMINOPHEN 7.5-325 MG PO TABS: 1.0000 | ORAL_TABLET | ORAL | Status: DC | PRN

## 2014-09-09 NOTE — Progress Notes (Signed)
Postop day number 3  Status post left knee arthroscopy with debridement of degenerative free edge tear meniscus medial, limited sign of ectopy.  Operative findings/pre and post op diagnosis: The operative findings are   Medial MILD CHONDROMALACIA , FREE EDGE DEGENERATION OF MEDIAL MENISCUS Lateral NORMAL Patellofemoral NORMAL Notch FRAYING OF ACL     Current complaints he says is very hard to put his foot on the ground because it hurts.? Not clear why he is having so much pain in his knee scope was not very significant in terms of findings  Current medications Norco 7.5  Surgical site clean portals  Assessment and plan Encouraged him to weight-bear as tolerated and start physical therapy

## 2014-09-09 NOTE — Patient Instructions (Signed)
Call to arrange physical therapy 

## 2014-09-10 ENCOUNTER — Ambulatory Visit (HOSPITAL_COMMUNITY): Payer: 59 | Attending: Orthopedic Surgery | Admitting: Physical Therapy

## 2014-09-10 ENCOUNTER — Telehealth: Payer: Self-pay | Admitting: Orthopedic Surgery

## 2014-09-10 DIAGNOSIS — M25662 Stiffness of left knee, not elsewhere classified: Secondary | ICD-10-CM

## 2014-09-10 DIAGNOSIS — R262 Difficulty in walking, not elsewhere classified: Secondary | ICD-10-CM | POA: Insufficient documentation

## 2014-09-10 DIAGNOSIS — M6281 Muscle weakness (generalized): Secondary | ICD-10-CM | POA: Insufficient documentation

## 2014-09-10 DIAGNOSIS — R29898 Other symptoms and signs involving the musculoskeletal system: Secondary | ICD-10-CM

## 2014-09-10 DIAGNOSIS — M25562 Pain in left knee: Secondary | ICD-10-CM | POA: Diagnosis present

## 2014-09-10 NOTE — Therapy (Signed)
[facility] [address] [place], [place], [address] Phone: [phone]   Fax:  [phone]  Physical Therapy Evaluation  Patient Details  Name: Vernon Fisher MRN: [id number] Date of Birth: 1956-04-26 Referring Provider:  Vickki Hearing, MD  Encounter Date: 09/10/2014      PT End of Session - 09/10/14 1730    Visit Number 1   Number of Visits 16   Authorization Type [employer]   Authorization - Visit Number 1   Authorization - Number of Visits 16   PT Start Time 1648   PT Stop Time 1732   PT Time Calculation (min) 44 min   Activity Tolerance Patient tolerated treatment well   Behavior During Therapy [facility] for tasks assessed/performed      Past Medical History  Diagnosis Date  . GERD (gastroesophageal reflux disease)   . Hypertension   . Diabetes mellitus without complication   . Migraine headache   . Degenerative disc disease   . Chronic pain   . Fibromyalgia   . Reflux   . Plantar fasciitis   . PONV (postoperative nausea and vomiting)   . Depression     Past Surgical History  Procedure Laterality Date  . Bilateral carpal tunnel release    . Right rotator cuff repair    . Nose surgery    . Colonoscopy  06/08/2011    Procedure: COLONOSCOPY;  Surgeon: [name], MD;  Location: [facility];  Service: Endoscopy;  Laterality: N/A;  11:15 AM  . Knee arthroscopy with medial menisectomy Left 09/06/2014    Procedure: KNEE ARTHROSCOPY WITH LIMITED DEBRIDEMENT;  Surgeon: Vickki Hearing, MD;  Location: [facility];  Service: Orthopedics;  Laterality: Left;    There were no vitals taken for this visit.  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 Notes a long history of low back pain that appears to be resistance to all forms of previous treatment. Notes Lt knee always hurts but pain improved with rest. Patient feels pressured to return to work but wants to hold off until knee is better. atent's job requires walking up ladders, crawling,  Notes Rt knee pain recently secondary to likely compensation.    Patient Stated Goals to be able to stand up out of a chair and walk without pain. to be able to lift 100lb from    Currently in Pain? Yes   Pain Score 8    Pain Location Knee   Pain Orientation Left   Pain Descriptors / Indicators Aching   Pain Type Surgical pain   Pain Onset In the past 7 days   Pain Frequency Constant   Aggravating Factors  walking, sit to stand.    Pain Relieving Factors ice, rest   Effect of Pain on Daily Activities unable to work          [facility] PT Assessment - 09/10/14 0001    Assessment   Medical Diagnosis s/p Lt knee arthroscopic   Onset Date 09/06/14   Next MD Visit Romeo Apple, 10/08/14  Prior Therapy No   Balance Screen   Has the patient fallen in the past 6 months No   Has the patient had a decrease in activity level because of a fear of falling?  No   Is the patient reluctant to leave their home because of a fear of falling?  No   Prior Function   Level of Independence Independent with basic ADLs   Observation/Other Assessments   Focus on Therapeutic Outcomes (FOTO)  74% limited   Functional Tests   Functional tests Sit to Stand;Other;Other2   ROM / Strength   AROM / PROM / Strength AROM;Strength   AROM   AROM Assessment Site Hip;Knee;Ankle   Right/Left Hip Right;Left   Right Hip External Rotation  47   Right Hip Internal Rotation  30   Left Hip External Rotation  39   Left Hip Internal Rotation  33   Right/Left Knee Right;Left   Right Knee Extension 0   Right Knee Flexion 130    Left Knee Extension -4   Left Knee Flexion 85   Right/Left Ankle Right;Left   Right Ankle Dorsiflexion 17   Left Ankle Dorsiflexion 10   Strength   Strength Assessment Site Hip;Knee;Ankle   Right/Left Hip Right;Left   Right Hip Flexion 5/5   Left Hip Flexion 5/5   Right/Left Knee Right;Left   Right Knee Flexion 5/5   Right Knee Extension 5/5   Left Knee Flexion 2+/5   Left Knee Extension 2+/5   Right/Left Ankle Right;Left   Right Ankle Dorsiflexion 5/5   Left Ankle Dorsiflexion 5/5                  [facility] Adult PT Treatment/Exercise - 09/10/14 0001    Exercises   Exercises Knee/Hip   Knee/Hip Exercises: Stretches   Active Hamstring Stretch 1 rep;20 seconds   Active Hamstring Stretch Limitations 3 way to 6"    Hip Flexor Stretch 1 rep;20 seconds   Hip Flexor Stretch Limitations 3 way to 6"    Gastroc Stretch 1 rep;20 seconds   Gastroc Stretch Limitations 3 way to 6"                 PT Education - 09/10/14 1907    Education provided Yes   Education Details Diagnosis, prognosis, home management of pain and HEP    Person(s) Educated Patient   Methods Explanation;Demonstration   Comprehension Verbalized understanding;Returned demonstration          PT Short Term Goals - 09/10/14 1858    PT SHORT TERM GOAL #1   Title Patient will be able to extend Lt knee to  0 degrees to  be able to ambulate with full knee extension.    Time 4   Period Weeks   Status New   PT SHORT TERM GOAL #2   Title Patient will be able to flex knee to 100 degrees to be able to squat to high chair    Time 4   Period Weeks   Status New   PT SHORT TERM GOAL #3   Title patient will be able to demosntrate 4/ quad and hamstring strength in Lt knee to be able to squat to and from a chair of normal height   Time 4   Period Weeks   Status New           PT Long Term Goals - 09/10/14 1903    PT LONG TERM GOAL #1   Title Patient will  be bale to walk withtou pain    Time 8   Period Weeks   Status New   PT LONG TERM GOAL #2   Title Patiwent will be able to flex Lt knee 130 degrees 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    Time 8   Period Weeks   Status New   PT LONG TERM GOAL #3   Title Patient will dmeonstrate 5/5 MMT for Lt LE strength so patient can lift 100lb from the floor as needed for return to work.    Time 8   Period Days   Status New   PT LONG TERM GOAL #4   Title patient will be independent with HEP.    Time 8   Period Weeks   Status New               Plan - 09/10/14 1853    Clinical Impression Statement Patient arrives s/p Lt knee arthroscopic surgery with difficulty walking and Lt knee pain secondary to stiffness. Patient will beenfit from skileld physical therapy to improve gait mechanincas, increase Lt knee AROM and and Lt knee strength so patient can return to work and be able to squat crawl and climb ladders without pain.    Pt will benefit from skilled therapeutic intervention in order to improve on the following deficits Decreased strength;Pain;Difficulty walking;Decreased activity tolerance;Abnormal gait;Decreased endurance;Improper body mechanics   Rehab Potential Good   PT Frequency 3x / week   PT Duration 8 weeks   PT Treatment/Interventions Therapeutic exercise;Manual techniques;Therapeutic activities;Patient/family education   PT Next Visit Plan Continue LE stretches, increase hieght of hamstring and hip flexor stretches, introduce 3D hip excursions. Introduce quad stretch   PT Home Exercise Plan hamstring, calf and hip flexor stretches   Consulted and Agree with Plan of Care Patient         Problem List Patient Active Problem List   Diagnosis Date Noted  . Other synovitis and tenosynovitis, unspecified lower leg   . Chronic pain syndrome 06/24/2014  . Abnormal fasting glucose 02/11/2014  . Fibromyalgia 09/29/2013  . Other malaise and fatigue 01/09/2013  . Chest pain 07/17/2012  . Essential  hypertension 07/17/2012  . Arthritis 07/17/2012   Jerilee Field PT DPT [phone]  [facility] [address] [place], [place], [address] Phone: [phone]   Fax:  [phone]

## 2014-09-10 NOTE — Telephone Encounter (Signed)
Patient just filled same prescription from Dr Gerda DissLuking and his stated must last 30 days, advised to hold our script until other script was complete

## 2014-09-10 NOTE — Telephone Encounter (Signed)
Call received from pharmacy tech, Margaretha Sheffieldauna, [facility] [place], with question on prescription issued to patient for pain medication Hydrocodone.  Ph# [phone]

## 2014-09-13 ENCOUNTER — Ambulatory Visit (HOSPITAL_COMMUNITY): Payer: 59

## 2014-09-13 DIAGNOSIS — M25562 Pain in left knee: Secondary | ICD-10-CM | POA: Diagnosis not present

## 2014-09-13 DIAGNOSIS — M25662 Stiffness of left knee, not elsewhere classified: Secondary | ICD-10-CM

## 2014-09-13 DIAGNOSIS — R262 Difficulty in walking, not elsewhere classified: Secondary | ICD-10-CM

## 2014-09-13 DIAGNOSIS — R29898 Other symptoms and signs involving the musculoskeletal system: Secondary | ICD-10-CM

## 2014-09-13 NOTE — Therapy (Signed)
[place] [facility] [address] [place], [place], [address] Phone: [phone]   Fax:  [phone]  Physical Therapy Treatment  Patient Details  Name: Vernon Fisher MRN: [id number] Date of Birth: 1956-04-15 Referring Provider:  Merlyn AlbertLuking, William S, MD  Encounter Date: 09/13/2014      PT End of Session - 09/13/14 1535    Visit Number 2   Number of Visits 16   Date for PT Re-Evaluation 10/09/14   Authorization Type UHC   Authorization Time Period 09/08/2014-11/08/2014   Authorization - Visit Number 2   Authorization - Number of Visits 16   PT Start Time 1518   PT Stop Time 1608   PT Time Calculation (min) 50 min   Activity Tolerance Patient tolerated treatment well   Behavior During Therapy [facility] for tasks assessed/performed      Past Medical History  Diagnosis Date  . GERD (gastroesophageal reflux disease)   . Hypertension   . Diabetes mellitus without complication   . Migraine headache   . Degenerative disc disease   . Chronic pain   . Fibromyalgia   . Reflux   . Plantar fasciitis   . PONV (postoperative nausea and vomiting)   . Depression     Past Surgical History  Procedure Laterality Date  . Bilateral carpal tunnel release    . Right rotator cuff repair    . Nose surgery    . Colonoscopy  06/08/2011    Procedure: COLONOSCOPY;  Surgeon: Arlyce HarmanSandi M Fields, MD;  [facility];  Service: Endoscopy;  Laterality: N/A;  11:15 AM  . Knee arthroscopy with medial menisectomy Left 09/06/2014    Procedure: KNEE ARTHROSCOPY WITH LIMITED DEBRIDEMENT;  Surgeon: Vickki HearingStanley E Harrison, MD;  Location: [facility];  Service: Orthopedics;  Laterality: Left;    There were no vitals filed for this visit.  Visit Diagnosis:  Left knee pain  Knee stiffness, left  Difficulty walking  Weakness of left lower extremity      Subjective Assessment - 09/13/14 1523    Symptoms Pain free currently with pain medication prior therapy today.  Reports  compliance with HEP without question.  Pt ambulating no AD this session.   Currently in Pain? No/denies            [facility] - 09/13/14 0001    Assessment   Medical Diagnosis s/p Lt knee arthroscopic   Onset Date 09/06/14   Next MD Visit Romeo AppleHarrison, 10/07/14    Prior Therapy No             [facility] Adult PT Treatment/Exercise - 09/13/14 1529    Exercises   Exercises Knee/Hip   Knee/Hip Exercises: Stretches   Active Hamstring Stretch 3 reps;30 seconds   Active Hamstring Stretch Limitations 3 way to 12"    Quad Stretch 3 reps;30 seconds   Quad Stretch Limitations prone with rope   Hip Flexor Stretch 3 reps;30 seconds   Hip Flexor Stretch Limitations 3 way to 12"    Piriformis Stretch 2 reps;30 seconds   Piriformis Stretch Limitations seated   Gastroc Stretch 3 reps;30 seconds   Gastroc Stretch Limitations slant board   Knee/Hip Exercises: Standing   Functional Squat 10 reps   Functional Squat Limitations 3D hip excurson   Rocker Board 2 minutes   Rocker Board Limitations R/L for weight distribution             PT Short Term Goals - 09/13/14 1620  PT SHORT TERM GOAL #1   Title Patient will be able to extend Lt knee to  0 degrees to  be able to ambulate with full knee extension.    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    PT LONG TERM GOAL #3   Title Patient will dmeonstrate 5/5 MMT for Lt LE strength  so patient can lift 100lb from the floor as needed for return to work.    PT LONG TERM GOAL #4   Title patient will be independent with HEP.                Plan - 09/13/14 1613    Clinical Impression Statement Session focus on instructing stretches fori mprove flexibility to improve ROM and exercises to improve hip mobilty.  Added piriformis and quad stretches to POC for flexibilty.  Added rocker board to improve weight distribution with gait;  No reports of pain through session.  Pt was encouraged to apply ice with elevation for pain and edema control.   PT Next Visit Plan Continue LE stretches and 3D hip excursion and begin quad strengthening for extension and knee drives for flexion.  Progress functonal strengthening as ROM improves        Problem List Patient Active Problem List   Diagnosis Date Noted  . Other synovitis and tenosynovitis, unspecified lower leg   . Chronic pain syndrome 06/24/2014  . Abnormal fasting glucose 02/11/2014  . Fibromyalgia 09/29/2013  . Other malaise and fatigue 01/09/2013  . Chest pain 07/17/2012  . Essential hypertension 07/17/2012  . Arthritis 07/17/2012   Becky Sax, LPTA [phone]  Juel Burrow 09/13/2014, 4:40 PM  [place] [facility] [address] [place], [place], [address] Phone: [phone]   Fax:  [phone]

## 2014-09-16 ENCOUNTER — Ambulatory Visit (HOSPITAL_COMMUNITY): Payer: 59 | Admitting: Physical Therapy

## 2014-09-16 DIAGNOSIS — R29898 Other symptoms and signs involving the musculoskeletal system: Secondary | ICD-10-CM

## 2014-09-16 DIAGNOSIS — M25662 Stiffness of left knee, not elsewhere classified: Secondary | ICD-10-CM

## 2014-09-16 DIAGNOSIS — R262 Difficulty in walking, not elsewhere classified: Secondary | ICD-10-CM

## 2014-09-16 DIAGNOSIS — M25562 Pain in left knee: Secondary | ICD-10-CM

## 2014-09-16 NOTE — Therapy (Signed)
[facility] [address] [place], [place], [address] Phone: [phone]   Fax:  [phone]  Physical Therapy Treatment  Patient Details  Name: Vernon Fisher MRN: [id number] Date of Birth: [birth year] Referring Provider:  Merlyn Albert, MD  Encounter Date: 09/16/2014      PT End of Session - 09/16/14 1337    Visit Number 3   Number of Visits 16   Date for PT Re-Evaluation 10/09/14   Authorization Type UHC   Authorization Time Period 09/08/2014-11/08/2014   Authorization - Visit Number 3   Authorization - Number of Visits 16   PT Start Time 1105   PT Stop Time 1150   PT Time Calculation (min) 45 min   Activity Tolerance Patient tolerated treatment well   Behavior During Therapy [facility] for tasks assessed/performed      Past Medical History  Diagnosis Date  . GERD (gastroesophageal reflux disease)   . Hypertension   . Diabetes mellitus without complication   . Migraine headache   . Degenerative disc disease   . Chronic pain   . Fibromyalgia   . Reflux   . Plantar fasciitis   . PONV (postoperative nausea and vomiting)   . Depression     Past Surgical History  Procedure Laterality Date  . Bilateral carpal tunnel release    . Right rotator cuff repair    . Nose surgery    . Colonoscopy  06/08/2011    Procedure: COLONOSCOPY;  Surgeon: [name], MD;  [facility];  Service: Endoscopy;  Laterality: N/A;  11:15 AM  . Knee arthroscopy with medial menisectomy Left 09/06/2014    Procedure: KNEE ARTHROSCOPY WITH LIMITED DEBRIDEMENT;  Surgeon: Vickki Hearing, MD;  Location: [facility];  Service: Orthopedics;  Laterality: Left;    There were no vitals filed for this visit.  Visit Diagnosis:  Knee stiffness, left  Left knee pain  Difficulty walking  Weakness of left lower extremity      Subjective Assessment - 09/16/14 1112    Symptoms Pt states he is hurting today due to drive to [place].  Currently  5/10 in Lt knee.   Currently in Pain? Yes   Pain Score 5    Pain Location Knee   Pain Orientation Left            [facility] Adult PT Treatment/Exercise - 09/16/14 1109    Knee/Hip Exercises: Stretches   Active Hamstring Stretch 3 reps;30 seconds   Active Hamstring Stretch Limitations 3 way to 14"    Hip Flexor Stretch 3 reps;30 seconds   Hip Flexor Stretch Limitations 3 way to 14"    Knee: Self-Stretch to increase Flexion 10 seconds   Knee: Self-Stretch Limitations 10 reps onto 14" box   Piriformis Stretch 2 reps;30 seconds   Piriformis Stretch Limitations seated   Gastroc Stretch 3 reps;30 seconds   Gastroc Stretch Limitations slant board   Knee/Hip Exercises: Standing   Forward Lunges Left;10 reps;Limitations   Forward Lunges Limitations onto 4" box no UE'   Lateral Step Up Left;10 reps;Step Height: 4"   Forward Step Up Left;10 reps;Step Height: 4"   Functional Squat 10 reps   Functional Squat Limitations 3D hip excurson   Rocker Board 2 minutes   Rocker Board Limitations R/L for weight distribution   Manual Therapy   Manual Therapy Myofascial release;Massage   Massage Lt knee to decrease edema   Myofascial Release Lt knee to decrease fascial restrictions  PT Short Term Goals - 09/13/14 1620    PT SHORT TERM GOAL #1   Title Patient will be able to extend Lt knee to  0 degrees to  be able to ambulate with full knee extension.    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    PT LONG TERM GOAL #3   Title Patient will dmeonstrate 5/5 MMT for Lt LE strength so patient can lift 100lb from the floor as needed for return to work.    PT LONG TERM GOAL #4   Title patient will be independent with HEP.                Plan - 09/16/14 1337    Clinical Impression Statement Continued to focus on increasing mobility and improve Lt knee ROM.  Added knee drives to increase ROM; lunges and step ups to increase quad strength.  Pt with continued sensitivity and swelling around knee so added manual techniuqes to decrease these limitations.  Pt encourged to continue manual techniqes at home to decrease his sensitivity.  Pt with overall pain reduction at end of session.   PT Next Visit Plan Continue LE stretches and strengthening with focus on knee extension. Progress functonal strengthening as ROM improves        Problem List Patient Active Problem List   Diagnosis Date Noted  . Other synovitis and tenosynovitis, unspecified lower leg   . Chronic pain syndrome 06/24/2014  . Abnormal fasting glucose 02/11/2014  . Fibromyalgia 09/29/2013  . Other malaise and fatigue 01/09/2013  . Chest pain 07/17/2012  . Essential hypertension 07/17/2012  . Arthritis 07/17/2012    Lurena Nidamy B Kimble Hitchens, PTA/CLT [phone] 09/16/2014, 2:01 PM  [place] [address] [facility] [address] [place], [place], [address] Phone: [phone]   Fax:  [phone]

## 2014-09-18 ENCOUNTER — Encounter (HOSPITAL_COMMUNITY): Payer: 59 | Admitting: Physical Therapy

## 2014-09-24 ENCOUNTER — Ambulatory Visit (HOSPITAL_COMMUNITY): Payer: 59

## 2014-09-24 DIAGNOSIS — R29898 Other symptoms and signs involving the musculoskeletal system: Secondary | ICD-10-CM

## 2014-09-24 DIAGNOSIS — M25562 Pain in left knee: Secondary | ICD-10-CM | POA: Diagnosis not present

## 2014-09-24 DIAGNOSIS — R262 Difficulty in walking, not elsewhere classified: Secondary | ICD-10-CM

## 2014-09-24 DIAGNOSIS — M25662 Stiffness of left knee, not elsewhere classified: Secondary | ICD-10-CM

## 2014-09-24 NOTE — Therapy (Signed)
[place], [place], [address] Phone: [phone]   Fax:  [phone]  Physical Therapy Treatment  Patient Details  Name: Vernon Fisher MRN: [id number] Date of Birth: 1956/03/15 Referring Provider:  Mikey Kirschner, MD  Encounter Date: 09/24/2014      PT End of Session - 09/24/14 1535    Visit Number 4   Number of Visits 16   Date for PT Re-Evaluation 10/09/14   Authorization Type UHC   Authorization Time Period 09/08/2014-11/08/2014   Authorization - Visit Number 4   Authorization - Number of Visits 16   PT Start Time 1520   PT Stop Time 1600   PT Time Calculation (min) 40 min   Activity Tolerance Patient tolerated treatment well   Behavior During Therapy [facility] for tasks assessed/performed      Past Medical History  Diagnosis Date  . GERD (gastroesophageal reflux disease)   . Hypertension   . Diabetes mellitus without complication   . Migraine headache   . Degenerative disc disease   . Chronic pain   . Fibromyalgia   . Reflux   . Plantar fasciitis   . PONV (postoperative nausea and vomiting)   . Depression     Past Surgical History  Procedure Laterality Date  . Bilateral carpal tunnel release    . Right rotator cuff repair    . Nose surgery    . Colonoscopy  06/08/2011    Procedure: COLONOSCOPY;  Surgeon: Dorothyann Peng, MD;  [facility];  Service: Endoscopy;  Laterality: N/A;  11:15 AM  . Knee arthroscopy with medial menisectomy Left 09/06/2014    Procedure: KNEE ARTHROSCOPY WITH LIMITED DEBRIDEMENT;  Surgeon: Carole Civil, MD;  Location: [facility];  Service: Orthopedics;  Laterality: Left;    There were no vitals filed for this visit.  Visit Diagnosis:  Knee stiffness, left  Left knee pain  Difficulty walking  Weakness of left lower extremity      Subjective Assessment - 09/24/14 1520    Symptoms Pt stated minimal pain 1-2/10, reports he has been doing his HEP plus  some.  Stated he has been doing stairs into home reciprocal pattern   Currently in Pain? Yes   Pain Score 2    Pain Location Knee   Pain Orientation Left;Posterior            [facility] PT Assessment - 09/24/14 0001    Assessment   Medical Diagnosis s/p Lt knee arthroscopic   Onset Date 09/06/14   Next MD Visit Aline Brochure, 10/07/14    Prior Therapy No                   [facility] Adult PT Treatment/Exercise - 09/24/14 1532    Exercises   Exercises Knee/Hip   Knee/Hip Exercises: Stretches   Active Hamstring Stretch 3 reps;30 seconds   Active Hamstring Stretch Limitations 3 way to 14"    Quad Stretch 3 reps;30 seconds   Quad Stretch Limitations prone with rope   Knee: Self-Stretch Limitations 3 way knee drive for ROM on 8in step    Piriformis Stretch 2 reps;30 seconds   Piriformis Stretch Limitations seated   Gastroc Stretch 3 reps;30 seconds   Gastroc Stretch Limitations slant board   Knee/Hip Exercises: Standing   Forward Lunges 10 reps;Limitations;Both   Forward Lunges Limitations onto 4" box no UE'   Side Lunges Both;10 reps   Side Lunges Limitations on 4in step  Lateral Step Up Left;10 reps;Hand Hold: 1;Step Height: 6"   Forward Step Up Left;10 reps;Hand Hold: 1;Step Height: 6"   Functional Squat 10 reps   Functional Squat Limitations 3D hip excurson                  PT Short Term Goals - 09/24/14 1535    PT SHORT TERM GOAL #1   Title Patient will be able to extend Lt knee to  0 degrees to  be able to ambulate with full knee extension.    Baseline 09/24/2014 Lt knee ROM 1-140 degrees    PT SHORT TERM GOAL #2   Title Patient will be able to flex knee to 100 degrees to be able to squat to high chair    Baseline 03/22/2016Lt  knee ROM 1-140 degrees   Status Achieved   PT SHORT TERM GOAL #3   Title patient will be able to demosntrate 4/5 quad and hamstring strength in Lt knee to be able to squat to and from a chair of normal height   Status On-going            PT Long Term Goals - 09/24/14 1536    PT LONG TERM GOAL #1   Title Patient will be bale to walk 63mnutes withtou pain   Status On-going   PT LONG TERM GOAL #2   Title Patiwent will be able to flex Lt knee 130 degrees 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    Baseline 09/24/2014 Lt knee AROM 1-140 degrees   Status Partially Met   PT LONG TERM GOAL #3   Title Patient will dmeonstrate 5/5 MMT for Lt LE strength so patient can lift 100lb from the floor as needed for return to work.    Status On-going   PT LONG TERM GOAL #4   Title patient will be independent with HEP.    Baseline Reports compliance with HEP daily   Status Achieved               Plan - 09/24/14 1543    Clinical Impression Statement Began initial focus of session with improving knee and hip mobility to improve gait and increase Lt knee ROM.  AROM goals progressing today with Lt knee extension at 1 degree away from TKE.  Pt able to demonstrated improved weight stance and depth with squats this session.  Progressed strengthening with side lunges for hamstring and gluteal strengthening and increased stair training height to simulate real step height.  No noted increased swelling this session through session, pt reports he does continue to ice 1-2x a day.   PT Next Visit Plan Continue LE stretches and strengthening to continue knee and hip mobility. Progress functonal strengthening as tolerated.  Begin step down exercises for eccentric control next sesson.        Problem List Patient Active Problem List   Diagnosis Date Noted  . Other synovitis and tenosynovitis, unspecified lower leg   . Chronic pain syndrome 06/24/2014  . Abnormal fasting glucose 02/11/2014  . Fibromyalgia 09/29/2013  . Other malaise and fatigue 01/09/2013  . Chest pain 07/17/2012  . Essential hypertension 07/17/2012  . Arthritis 07/17/2012   CIhor [place] LPaint [place]  CAldona Lento3/22/2016, 4:33  PM  [place] [address]Phone: [phone]  Fax:  [phone]

## 2014-09-26 ENCOUNTER — Ambulatory Visit (HOSPITAL_COMMUNITY): Payer: 59

## 2014-09-26 DIAGNOSIS — M25562 Pain in left knee: Secondary | ICD-10-CM | POA: Diagnosis not present

## 2014-09-26 DIAGNOSIS — R262 Difficulty in walking, not elsewhere classified: Secondary | ICD-10-CM

## 2014-09-26 DIAGNOSIS — M25662 Stiffness of left knee, not elsewhere classified: Secondary | ICD-10-CM

## 2014-09-26 DIAGNOSIS — R29898 Other symptoms and signs involving the musculoskeletal system: Secondary | ICD-10-CM

## 2014-09-26 NOTE — Therapy (Signed)
[place] [place] [facility] [address] [place], [place], [address] Phone: [phone]   Fax:  [phone]  Physical Therapy Treatment  Patient Details  Name: Vernon Fisher MRN: [id number] Date of Birth: 11/22/1955 Referring Provider:  Merlyn AlbertLuking, William S, MD  Encounter Date: 09/26/2014      PT End of Session - 09/26/14 0808    Visit Number 5   Number of Visits 16   Date for PT Re-Evaluation 10/09/14   Authorization Type UHC   Authorization Time Period 09/08/2014-11/08/2014   Authorization - Visit Number 5   Authorization - Number of Visits 16   PT Start Time 0802   PT Stop Time 0842   PT Time Calculation (min) 40 min   Activity Tolerance Patient tolerated treatment well   Behavior During Therapy [facility] for tasks assessed/performed      Past Medical History  Diagnosis Date  . GERD (gastroesophageal reflux disease)   . Hypertension   . Diabetes mellitus without complication   . Migraine headache   . Degenerative disc disease   . Chronic pain   . Fibromyalgia   . Reflux   . Plantar fasciitis   . PONV (postoperative nausea and vomiting)   . Depression     Past Surgical History  Procedure Laterality Date  . Bilateral carpal tunnel release    . Right rotator cuff repair    . Nose surgery    . Colonoscopy  06/08/2011    Procedure: COLONOSCOPY;  Surgeon: Arlyce HarmanSandi M Fields, MD;  [facility];  Service: Endoscopy;  Laterality: N/A;  11:15 AM  . Knee arthroscopy with medial menisectomy Left 09/06/2014    Procedure: KNEE ARTHROSCOPY WITH LIMITED DEBRIDEMENT;  Surgeon: Vickki HearingStanley E Harrison, MD;  Location: [facility];  Service: Orthopedics;  Laterality: Left;    There were no vitals filed for this visit.  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 Yes   Pain Score 3    Pain Location Knee   Pain Orientation Left;Anterior;Proximal   Pain Descriptors / Indicators Aching;Sore            [facility] PT Assessment - 09/26/14 0001    Assessment   Medical Diagnosis s/p Lt knee arthroscopic   Onset Date 09/06/14   Next MD Visit Romeo AppleHarrison, 10/07/14    Prior Therapy No                   [facility] Adult PT Treatment/Exercise - 09/26/14 0807    Exercises   Exercises Knee/Hip   Knee/Hip Exercises: Stretches   Active Hamstring Stretch 3 reps;30 seconds   Active Hamstring Stretch Limitations 3 way to 14"    Quad Stretch 3 reps;30 seconds   Quad Stretch Limitations prone with rope   Knee: Self-Stretch Limitations 3 way knee drive for ROM on 8in step    ITB Stretch 2 reps;30 seconds   ITB Stretch Limitations beside 6in step   Piriformis Stretch 3 reps;30 seconds   Piriformis Stretch Limitations seated   Gastroc Stretch 3 reps;30 seconds   Gastroc Stretch Limitations slant board   Knee/Hip Exercises: Standing   Lateral Step Up Left;10 reps;Hand Hold: 1;Step Height: 6"  Forward Step Up Left;10 reps;Hand Hold: 1;Step Height: 6"   Functional Squat 15 reps   Functional Squat Limitations 3D hip excurson                  PT Short Term Goals - 09/26/14 1610    PT SHORT TERM GOAL #1   Title Patient will be able to extend Lt knee to  0 degrees to  be able to ambulate with full knee extension.    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    PT LONG TERM GOAL #3   Title Patient will dmeonstrate 5/5 MMT for Lt LE strength so patient can lift 100lb from the floor as needed for return to work.    PT LONG TERM GOAL #4   Title patient will be independent with HEP.    Status Achieved               Plan - 09/26/14 0808    Clinical Impression Statement Held advancement to step down exercises folllowing reports of increase soreness from work under his house yesterday.  Initial focus of session to improve flexibility and hip mobilty, progressed to functional strengthening as tolerated.  Pt reported pain free at end of session.  Encouraged pt apply ice and  continue stretches for pain control following session.     PT Next Visit Plan Continue LE stretches and strengthening to continue knee and hip mobility. Progress functonal strengthening as tolerated.  Begin step down exercises for eccentric control next sesson.        Problem List Patient Active Problem List   Diagnosis Date Noted  . Other synovitis and tenosynovitis, unspecified lower leg   . Chronic pain syndrome 06/24/2014  . Abnormal fasting glucose 02/11/2014  . Fibromyalgia 09/29/2013  . Other malaise and fatigue 01/09/2013  . Chest pain 07/17/2012  . Essential hypertension 07/17/2012  . Arthritis 07/17/2012   Becky Sax, LPTA [phone]  Juel Burrow 09/26/2014, 8:47 AM   [facility] [address] [place], [place], [address] Phone: [phone]   Fax:  [phone]

## 2014-09-27 ENCOUNTER — Encounter (HOSPITAL_COMMUNITY): Payer: 59 | Admitting: Physical Therapy

## 2014-09-30 ENCOUNTER — Ambulatory Visit (HOSPITAL_COMMUNITY): Payer: 59

## 2014-10-02 ENCOUNTER — Ambulatory Visit (HOSPITAL_COMMUNITY): Payer: 59 | Admitting: Physical Therapy

## 2014-10-02 DIAGNOSIS — M25562 Pain in left knee: Secondary | ICD-10-CM

## 2014-10-02 DIAGNOSIS — R29898 Other symptoms and signs involving the musculoskeletal system: Secondary | ICD-10-CM

## 2014-10-02 DIAGNOSIS — R262 Difficulty in walking, not elsewhere classified: Secondary | ICD-10-CM

## 2014-10-02 DIAGNOSIS — M25662 Stiffness of left knee, not elsewhere classified: Secondary | ICD-10-CM

## 2014-10-02 NOTE — Therapy (Signed)
[facility] [address] [place], [place], [address] Phone: [phone]   Fax:  [phone]  Physical Therapy Treatment  Patient Details  Name: Vernon Fisher MRN: [id number] Date of Birth: 1956-06-15 Referring Provider:  Merlyn Albert, MD  Encounter Date: 10/02/2014      PT End of Session - 10/02/14 0912    Visit Number 6   Number of Visits 16   Date for PT Re-Evaluation 10/09/14   Authorization Type UHC   Authorization Time Period 09/08/2014-11/08/2014   Authorization - Visit Number 6   Authorization - Number of Visits 16   PT Start Time 0854   PT Stop Time 0934   PT Time Calculation (min) 40 min   Activity Tolerance Patient tolerated treatment well   Behavior During Therapy [facility] for tasks assessed/performed      Past Medical History  Diagnosis Date  . GERD (gastroesophageal reflux disease)   . Hypertension   . Diabetes mellitus without complication   . Migraine headache   . Degenerative disc disease   . Chronic pain   . Fibromyalgia   . Reflux   . Plantar fasciitis   . PONV (postoperative nausea and vomiting)   . Depression     Past Surgical History  Procedure Laterality Date  . Bilateral carpal tunnel release    . Right rotator cuff repair    . Nose surgery    . Colonoscopy  06/08/2011    Procedure: COLONOSCOPY;  Surgeon: [name], MD;  [facility];  Service: Endoscopy;  Laterality: N/A;  11:15 AM  . Knee arthroscopy with medial menisectomy Left 09/06/2014    Procedure: KNEE ARTHROSCOPY WITH LIMITED DEBRIDEMENT;  Surgeon: Vickki Hearing, MD;  Location: [facility];  Service: Orthopedics;  Laterality: Left;    There were no vitals filed for this visit.  Visit Diagnosis:  Knee stiffness, left  Left knee pain  Difficulty walking  Weakness of left lower extremity      Subjective Assessment - 10/02/14 0854    Symptoms States decreased pain following last sssion notes increased soreness  today possibly attrributed to sleeping in an awkward position.    Currently in Pain? Yes   Pain Score 2    Pain Location Knee   Pain Orientation Left;Anterior;Lateral   Pain Descriptors / Indicators Sore                       [facility] Adult PT Treatment/Exercise - 10/02/14 0001    Knee/Hip Exercises: Stretches   Active Hamstring Stretch 3 reps;30 seconds   Active Hamstring Stretch Limitations 3 way to 14"    Quad Stretch 3 reps;30 seconds   Quad Stretch Limitations prone with rope   Hip Flexor Stretch Limitations 10x 3seconds to12" box 3 way   ITB Stretch 30 seconds;1 rep   ITB Stretch Limitations beside 6in step   Piriformis Stretch 30 seconds;4 reps   Piriformis Stretch Limitations seated   Gastroc Stretch Limitations floor 10x 3 seconds neutral and eversion focus   Knee/Hip Exercises: Standing   Forward Lunges 10 reps;Limitations;Both   Forward Lunges Limitations onto 4" box no UE', second set to the floor with 3 way reach 10x each    Side Lunges Both;10 reps   Side Lunges Limitations on 4in step   Lateral Step Up Left;Hand Hold: 1;Step Height: 6";5 reps   Forward Step Up Left;Hand Hold: 1;Step Height: 6";5 reps   Other Standing Knee  Exercises 3D hip excursions 10x   Other Standing Knee Exercises 2D walking hip excursions 1430ft each                   PT Short Term Goals - 09/26/14 16100828    PT SHORT TERM GOAL #1   Title Patient will be able to extend Lt knee to  0 degrees to  be able to ambulate with full knee extension.    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    PT LONG TERM GOAL #3   Title Patient will dmeonstrate 5/5 MMT for Lt LE strength so patient can lift 100lb from the floor as needed for return to work.    PT LONG TERM GOAL #4   Title patient will be independent with HEP.    Status Achieved               Plan - 10/02/14 [id number]    Clinical Impression Statement Session continued focus on increasign hip, knee and ankle ROM to improve gait mechanics, noted continued limitation in piriformis muscle mobility resultin in Lt lateral toe whip during gait. Held step downs due to initial Air cabin crewgait mechanic defiecency. Patient dmeonstrated good performance of lunges at end of session. added 3 way lunge reach at nd of sesssion to begin adding multiple directions to lunges to enanhce adaptation to full loading on floor.    PT Next Visit Plan Continue LE stretches and strengthening to continue knee and hip mobility. progress lunges to floor with 3 way UE reach and side lunges . reintrodue step-ups.         Problem List Patient Active Problem List   Diagnosis Date Noted  . Other synovitis and tenosynovitis, unspecified lower leg   . Chronic pain syndrome 06/24/2014  . Abnormal fasting glucose 02/11/2014  . Fibromyalgia 09/29/2013  . Other malaise and fatigue 01/09/2013  . Chest pain 07/17/2012  . Essential hypertension 07/17/2012  . Arthritis 07/17/2012   Jerilee Fieldash Una Yeomans PT DPT [phone]  [facility] [address] [place], [place], [address] Phone: [phone]   Fax:  [phone]

## 2014-10-03 ENCOUNTER — Encounter: Payer: Self-pay | Admitting: Family Medicine

## 2014-10-04 ENCOUNTER — Ambulatory Visit (HOSPITAL_COMMUNITY): Payer: 59 | Admitting: Physical Therapy

## 2014-10-04 ENCOUNTER — Telehealth (HOSPITAL_COMMUNITY): Payer: Self-pay

## 2014-10-04 NOTE — Telephone Encounter (Signed)
Patient had to cancel and leave therapy because he received a call from his son that he was'nt feeling well and thought he was having a stroke.

## 2014-10-07 ENCOUNTER — Encounter: Payer: Self-pay | Admitting: Orthopedic Surgery

## 2014-10-07 ENCOUNTER — Ambulatory Visit (INDEPENDENT_AMBULATORY_CARE_PROVIDER_SITE_OTHER): Payer: 59 | Admitting: Orthopedic Surgery

## 2014-10-07 ENCOUNTER — Ambulatory Visit (HOSPITAL_COMMUNITY): Payer: 59 | Attending: Orthopedic Surgery | Admitting: Physical Therapy

## 2014-10-07 VITALS — BP 123/77 | Ht 71.0 in | Wt 220.0 lb

## 2014-10-07 DIAGNOSIS — M25562 Pain in left knee: Secondary | ICD-10-CM | POA: Diagnosis present

## 2014-10-07 DIAGNOSIS — R262 Difficulty in walking, not elsewhere classified: Secondary | ICD-10-CM | POA: Diagnosis not present

## 2014-10-07 DIAGNOSIS — M25662 Stiffness of left knee, not elsewhere classified: Secondary | ICD-10-CM | POA: Diagnosis not present

## 2014-10-07 DIAGNOSIS — R29898 Other symptoms and signs involving the musculoskeletal system: Secondary | ICD-10-CM

## 2014-10-07 DIAGNOSIS — Z9889 Other specified postprocedural states: Secondary | ICD-10-CM

## 2014-10-07 DIAGNOSIS — M6281 Muscle weakness (generalized): Secondary | ICD-10-CM | POA: Insufficient documentation

## 2014-10-07 NOTE — Progress Notes (Signed)
Postop visit #2  Chief Complaint  Patient presents with  . Follow-up    Post op #2, SALK. DOS 09-06-14.    The patient is doing well is walking well is taking minimal amounts of pain medication his pain is well-controlled his gait pattern has improved his therapy went well he's worked in a limited fashion but is ready to return to work on April 18. His knee looks good he has no swelling is full range of motion no tenderness  Recommend follow-up in May and he can return to work in April

## 2014-10-07 NOTE — Patient Instructions (Signed)
Return to work April 18  Continue therapy

## 2014-10-07 NOTE — Therapy (Signed)
[facility] [address] [place], [place], [address] Phone: [phone]   Fax:  [phone]  Physical Therapy Treatment  Patient Details  Name: Vernon KetoRobert L Fisher MRN: [id number] Date of Birth: Nov 05, 1955 Referring Provider:  Vickki HearingHarrison, Stanley E, MD  Encounter Date: 10/07/2014      PT End of Session - 10/07/14 1143    Visit Number 7   Number of Visits 16   Date for PT Re-Evaluation 10/09/14   Authorization Type UHC   Authorization Time Period 09/08/2014-11/08/2014   Authorization - Visit Number 7   Authorization - Number of Visits 16   PT Start Time 1103   PT Stop Time 1146   PT Time Calculation (min) 43 min   Activity Tolerance Patient tolerated treatment well   Behavior [facility] for tasks assessed/performed      Past Medical History  Diagnosis Date  . GERD (gastroesophageal reflux disease)   . Hypertension   . Diabetes mellitus without complication   . Migraine headache   . Degenerative disc disease   . Chronic pain   . Fibromyalgia   . Reflux   . Plantar fasciitis   . PONV (postoperative nausea and vomiting)   . Depression     Past Surgical History  Procedure Laterality Date  . Bilateral carpal tunnel release    . Right rotator cuff repair    . Nose surgery    . Colonoscopy  06/08/2011    Procedure: COLONOSCOPY;  Surgeon: Arlyce HarmanSandi M Fields, MD;  [facility];  Service: Endoscopy;  Laterality: N/A;  11:15 AM  . Knee arthroscopy with medial menisectomy Left 09/06/2014    Procedure: KNEE ARTHROSCOPY WITH LIMITED DEBRIDEMENT;  Surgeon: Vickki HearingStanley E Harrison, MD;  Location: [facility];  Service: Orthopedics;  Laterality: Left;    There were no vitals filed for this visit.  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 Notes a long history of low back pain that appears to be resistance to all forms of previous treatment. Notes Lt knee always hurts but pain improved with rest. Patient feels pressured to return to work but wants to hold off until knee is better. atent's job requires walking up ladders, crawling,  Notes Rt knee pain recently secondary to likely compensation.    Currently in Pain? Yes   Pain Score 2    Pain Location Knee   Pain Orientation Left                       [facility] Adult PT Treatment/Exercise - 10/07/14 0001    Knee/Hip Exercises: Stretches   Active Hamstring Stretch 3 reps;30 seconds   Active Hamstring Stretch Limitations 3 way to 14"    Quad Stretch 3 reps;30 seconds   Quad Stretch Limitations prone with rope   Piriformis Stretch 2 reps;30 seconds   Piriformis Stretch Limitations seated   Gastroc Stretch 3 reps;30 seconds   Gastroc Stretch Limitations 3-way, slantboards   Knee/Hip Exercises: Standing  Forward Lunges Both;1 set;15 reps   Forward Lunges Limitations floor    Side Lunges --   Side Lunges Limitations --   Lateral Step Up 1 set;10 reps;Both   Lateral Step Up Limitations 6 inch box    Forward Step Up 1 set;10 reps;Both   Forward Step Up Limitations 6 inch box    Functional Squat 10 reps   Functional Squat Limitations x10 on flat surface and x10 on BOSU   Other Standing Knee Exercises 3D hip excursions split stance 1x10             Balance Exercises - 10/07/14 1139    Balance Exercises: Standing   Standing Eyes Opened Narrow base of support (BOS);Foam/compliant surface;3 reps  BOSU   Tandem Stance Eyes open;Foam/compliant surface;3 reps;15 secs  BOSU           PT Education -  10/07/14 1143    Education provided No          PT Short Term Goals - 09/26/14 0828    PT SHORT TERM GOAL #1   Title Patient will be able to extend Lt knee to  0 degrees to  be able to ambulate with full knee extension.    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    PT LONG TERM GOAL #3   Title Patient will dmeonstrate 5/5 MMT for Lt LE strength so patient can lift 100lb from the floor as needed for return to work.    PT LONG TERM GOAL #4   Title patient will be independent with HEP.    Status Achieved               Plan - 10/07/14 1153    Clinical Impression Statement Continued focus on functional exercises and stretching. Patient overall tolerated session well with no reports of pain throughout session, very pleasant to work with. Lunges progressed to floor and added squats on BOSU after patient demonstrated correct form on solid surface.    Pt will benefit from skilled therapeutic intervention in order to improve on the following deficits Decreased strength;Pain;Difficulty walking;Decreased activity tolerance;Abnormal gait;Decreased endurance;Improper body mechanics   Rehab Potential Good   PT Frequency 3x / week   PT Duration 8 weeks   PT Treatment/Interventions Therapeutic exercise;Manual techniques;Therapeutic activities;Patient/family education   PT Next Visit Plan Continue LE stretches and strengthening to continue knee and hip mobility. progress lunges  to floor with 3 way UE reach and side lunges . reintrodue step-ups.    PT Home Exercise Plan hamstring, calf and hip flexor stretches   Consulted and Agree with Plan of Care Patient        Problem List Patient Active Problem List   Diagnosis Date Noted  . Other  synovitis and tenosynovitis, unspecified lower leg   . Chronic pain syndrome 06/24/2014  . Abnormal fasting glucose 02/11/2014  . Fibromyalgia 09/29/2013  . Other malaise and fatigue 01/09/2013  . Chest pain 07/17/2012  . Essential hypertension 07/17/2012  . Arthritis 07/17/2012    Nedra Hai PT, DPT [phone]  [facility] [address] [place], [place], [address] Phone: [phone]   Fax:  [phone]

## 2014-10-09 ENCOUNTER — Ambulatory Visit (HOSPITAL_COMMUNITY): Payer: 59 | Admitting: Physical Therapy

## 2014-10-11 ENCOUNTER — Ambulatory Visit (HOSPITAL_COMMUNITY): Payer: 59 | Admitting: Physical Therapy

## 2014-10-11 DIAGNOSIS — R29898 Other symptoms and signs involving the musculoskeletal system: Secondary | ICD-10-CM

## 2014-10-11 DIAGNOSIS — R262 Difficulty in walking, not elsewhere classified: Secondary | ICD-10-CM

## 2014-10-11 DIAGNOSIS — M25662 Stiffness of left knee, not elsewhere classified: Secondary | ICD-10-CM

## 2014-10-11 DIAGNOSIS — M25562 Pain in left knee: Secondary | ICD-10-CM | POA: Diagnosis not present

## 2014-10-11 NOTE — Therapy (Signed)
[facility] [address] [place], [place], [address] Phone: [phone]   Fax:  [phone]  Physical Therapy Treatment  Patient Details  Name: Vernon Fisher MRN: [id number] Date of Birth: 1956-01-18 Referring Provider:  Beverely Low, MD  Encounter Date: 10/11/2014      PT End of Session - 10/11/14 0813    Visit Number 8   Number of Visits 16   Date for PT Re-Evaluation 11/08/14   Authorization Type UHC   Authorization Time Period 09/08/2014-11/08/2014   Authorization - Visit Number 8   Authorization - Number of Visits 16   PT Start Time 0801   PT Stop Time 0845   PT Time Calculation (min) 44 min   Activity Tolerance Patient tolerated treatment well   Behavior During Therapy [facility] for tasks assessed/performed      Past Medical History  Diagnosis Date  . GERD (gastroesophageal reflux disease)   . Hypertension   . Diabetes mellitus without complication   . Migraine headache   . Degenerative disc disease   . Chronic pain   . Fibromyalgia   . Reflux   . Plantar fasciitis   . PONV (postoperative nausea and vomiting)   . Depression     Past Surgical History  Procedure Laterality Date  . Bilateral carpal tunnel release    . Right rotator cuff repair    . Nose surgery    . Colonoscopy  06/08/2011    Procedure: COLONOSCOPY;  Surgeon: [name], MD;  [facility];  Service: Endoscopy;  Laterality: N/A;  11:15 AM  . Knee arthroscopy with medial menisectomy Left 09/06/2014    Procedure: KNEE ARTHROSCOPY WITH LIMITED DEBRIDEMENT;  Surgeon: Vickki Hearing, MD;  Location: [facility];  Service: Orthopedics;  Laterality: Left;    There were no vitals filed for this visit.  Visit Diagnosis:  No diagnosis found.      Subjective Assessment - 10/11/14 0807    Subjective Patient notes increased knee soreness on Left, but has had a stressful week with his wife recieving breast cancer treatment and him having to help her  in and out of the car.    Currently in Pain? Yes   Pain Score 2    Pain Location Knee   Pain Orientation Left   Pain Descriptors / Indicators Aching   Pain Type Surgical pain                       [facility] Adult PT Treatment/Exercise - 10/11/14 0812    Knee/Hip Exercises: Stretches   Active Hamstring Stretch Limitations 3 way to 14" 10x 3 second hold   Quad Stretch 3 reps;30 seconds   Quad Stretch Limitations prone with rope   Hip Flexor Stretch Limitations 10x 3seconds to12" box 3 way   Piriformis Stretch Limitations seated 10x 3sec 3 way   Gastroc Stretch Limitations 10x 3 seconds 3 ways   Knee/Hip Exercises: Standing   Forward Lunges Both;5 reps   Forward Lunges Limitations lunge matrix common   Functional Squat Limitations Feet neutral and split stace (3 sets) 10x each   Other Standing Knee Exercises 3D hip excursions split stance 1x10   Other Standing Knee Exercises 1/2 kneel 3D hip excursions 10x   Manual Therapy   Myofascial Release Lt knee to decrease fascial restrictions                  PT Short Term Goals - 09/26/14  [id number]    PT SHORT TERM GOAL #1   Title Patient will be able to extend Lt knee to  0 degrees to  be able to ambulate with full knee extension.    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    PT LONG TERM GOAL #3   Title Patient will dmeonstrate 5/5 MMT for Lt LE strength so patient can lift 100lb from the floor as needed  for return to work.    PT LONG TERM GOAL #4   Title patient will be independent with HEP.    Status Achieved               Plan - 10/11/14 0816    Clinical Impression Statement Continued focus on imprioving loadign and progressed lunges to floor with 3 way UE reach and side lunges. patient had no increase in pain throughout session. Manual performed to improve quadriceps mobility.session focused on progressing ability to squat through full depth and tolerance for 1/2 kneeling.    PT Next Visit Plan Continue LE stretches and strengthening to continue knee and hip mobility.         Problem List Patient Active Problem List   Diagnosis Date Noted  . Other synovitis and tenosynovitis, unspecified lower leg   . Chronic pain syndrome 06/24/2014  . Abnormal fasting glucose 02/11/2014  . Fibromyalgia 09/29/2013  . Other malaise and fatigue 01/09/2013  . Chest pain 07/17/2012  . Essential hypertension 07/17/2012  . Arthritis 07/17/2012    Jerilee FieldeWitt, Adel Burch R 10/11/2014, 8:48 AM  [place] [address] [place], [place], [address] Phone: [phone]   Fax:  [phone]

## 2014-10-14 ENCOUNTER — Ambulatory Visit (HOSPITAL_COMMUNITY): Payer: 59 | Admitting: Physical Therapy

## 2014-10-16 ENCOUNTER — Ambulatory Visit (HOSPITAL_COMMUNITY): Payer: 59 | Admitting: Physical Therapy

## 2014-10-16 DIAGNOSIS — R29898 Other symptoms and signs involving the musculoskeletal system: Secondary | ICD-10-CM

## 2014-10-16 DIAGNOSIS — M25662 Stiffness of left knee, not elsewhere classified: Secondary | ICD-10-CM

## 2014-10-16 DIAGNOSIS — R262 Difficulty in walking, not elsewhere classified: Secondary | ICD-10-CM

## 2014-10-16 DIAGNOSIS — M25562 Pain in left knee: Secondary | ICD-10-CM

## 2014-10-16 NOTE — Therapy (Signed)
[place] [facility] [address] [place], [place], [address] Phone: [phone]   Fax:  [phone]  Physical Therapy Treatment/Reassesssment  Patient Details  Name: PLUMER MITTELSTAEDT MRN: [id number] Date of Birth: 15-Oct-1955 Referring Provider:  Vickki Hearing, MD  Encounter Date: 10/16/2014      PT End of Session - 10/16/14 0902    Visit Number 9   Number of Visits 16   Date for PT Re-Evaluation 11/08/14   Authorization Type UHC   Authorization Time Period 09/08/2014-11/08/2014   Authorization - Visit Number 9   Authorization - Number of Visits 16   PT Start Time 0846   PT Stop Time 0930   PT Time Calculation (min) 44 min   Activity Tolerance Patient tolerated treatment well   Behavior During Therapy [facility] for tasks assessed/performed      Past Medical History  Diagnosis Date  . GERD (gastroesophageal reflux disease)   . Hypertension   . Diabetes mellitus without complication   . Migraine headache   . Degenerative disc disease   . Chronic pain   . Fibromyalgia   . Reflux   . Plantar fasciitis   . PONV (postoperative nausea and vomiting)   . Depression     Past Surgical History  Procedure Laterality Date  . Bilateral carpal tunnel release    . Right rotator cuff repair    . Nose surgery    . Colonoscopy  06/08/2011    Procedure: COLONOSCOPY;  Surgeon: [name], MD;  [facility];  Service: Endoscopy;  Laterality: N/A;  11:15 AM  . Knee arthroscopy with medial menisectomy Left 09/06/2014    Procedure: KNEE ARTHROSCOPY WITH LIMITED DEBRIDEMENT;  Surgeon: Vickki Hearing, MD;  Location: [facility];  Service: Orthopedics;  Laterality: Left;    There were no vitals filed for this visit.  Visit Diagnosis:  Knee stiffness, left  Left knee pain  Difficulty walking  Weakness of left lower extremity      Subjective Assessment - 10/16/14 0853    Subjective Patient notes continued difficulty ambulating up and  down stairs repetitively and feels his knee continues to be stiff.    Currently in Pain? Yes   Pain Score 2    Pain Location Knee   Pain Orientation Left            [facility] PT Assessment - 10/16/14 0001    AROM   Left Hip External Rotation  46   Left Hip Internal Rotation  36   Right Knee Extension 0   Right Knee Flexion 130   Left Knee Extension 0   Left Knee Flexion 126   Left Ankle Dorsiflexion 14   Strength   Right Hip Flexion 5/5   Left Hip Flexion 5/5   Left Hip Extension 4/5   Left Hip ABduction 4+/5   Right Knee Flexion 5/5   Right Knee Extension 5/5   Left Knee Flexion 5/5   Left Knee Extension 5/5   Right Ankle Dorsiflexion 5/5   Left Ankle Dorsiflexion 5/5   Flexibility   Soft Tissue Assessment /Muscle Length yes   Quadriceps Positive thomas test and Ely's test whn cued for rectus femoris.             [facility] Adult PT Treatment/Exercise - 10/16/14 0001    Knee/Hip Exercises: Stretches   Active Hamstring Stretch Limitations 3 way to 14" 3x 20 second hold   Quad Stretch 3 reps;30 seconds  Quad Stretch Limitations prone with rope   Hip Flexor Stretch Limitations 10x 3seconds to12" box 3 way with therapist assistance for knee extension.    Knee/Hip Exercises: Standing   Heel Raises 20 reps   Forward Lunges Both;5 reps   Forward Lunges Limitations lunge matrix common   Lateral Step Up 1 set;10 reps;Both   Lateral Step Up Limitations 6 inch box    Forward Step Up 1 set;10 reps;Both   Forward Step Up Limitations 6 inch box    Other Standing Knee Exercises 1/2 kneel 3D hip excursions 10x           PT Short Term Goals - 10/16/14 0931    PT SHORT TERM GOAL #1   Title Patient will be able to extend Lt knee to  0 degrees to  be able to ambulate with full knee extension.    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    Status Partially Met   PT LONG TERM GOAL #3   Title Patient will dmeonstrate 5/5 MMT for Lt LE strength so patient can lift 100lb from the floor as needed for return to work.    Status On-going   PT LONG TERM GOAL #4   Title patient will be independent with HEP.    Status Achieved   PT LONG TERM GOAL #5   Title patient to be able to ambualte up and down stairs without pain indicatign glut max strength 5/5/ MMT.    Status On-going           Plan - 10/16/14 0928    Clinical Impression Statement Patient dispalsy knee ROM WNL's thoguh he continues to have pain in Lt knee secondary to continued Lt rectus femoris flexibility limitations and Lt glute weakness resultingin increased reliance on quadriceps for stair ambulation. PT to continue 2x a week for 4 more weeks to reach remaining long term goals so patient can ambulate up and down stairs withtou pain.    PT Next Visit Plan Stretches to focus on rectus femoris and quadriceps and hip flexor ROM and strengtheing on glutes with focus on decreasing pain durign stiar ambulation.         Problem List Patient Active Problem List   Diagnosis Date Noted  . Other synovitis and tenosynovitis, unspecified lower leg   . Chronic pain syndrome 06/24/2014  . Abnormal fasting glucose 02/11/2014  . Fibromyalgia 09/29/2013  . Other malaise and fatigue 01/09/2013  . Chest pain 07/17/2012  . Essential hypertension 07/17/2012  . Arthritis 07/17/2012   Devona Konig PT DPT [place], [place], [address] Phone: [phone]  Fax:  [phone]

## 2014-10-18 ENCOUNTER — Ambulatory Visit (HOSPITAL_COMMUNITY): Payer: 59

## 2014-10-18 DIAGNOSIS — M25562 Pain in left knee: Secondary | ICD-10-CM | POA: Diagnosis not present

## 2014-10-18 DIAGNOSIS — M25662 Stiffness of left knee, not elsewhere classified: Secondary | ICD-10-CM

## 2014-10-18 DIAGNOSIS — R29898 Other symptoms and signs involving the musculoskeletal system: Secondary | ICD-10-CM

## 2014-10-18 DIAGNOSIS — R262 Difficulty in walking, not elsewhere classified: Secondary | ICD-10-CM

## 2014-10-18 NOTE — Therapy (Signed)
[facility] [address] [place], [place], [address] Phone: [phone]   Fax:  [phone]  Physical Therapy Treatment  Patient Details  Name: Vernon KetoRobert L Truszkowski MRN: [id number] Date of Birth: August 11, 1955 Referring Provider:  Vickki HearingHarrison, Stanley E, MD  Encounter Date: 10/18/2014      PT End of Session - 10/18/14 0815    Visit Number 10   Number of Visits 16   Date for PT Re-Evaluation 11/08/14   Authorization Type UHC   Authorization Time Period 09/08/2014-11/08/2014   Authorization - Visit Number 10   Authorization - Number of Visits 16   PT Start Time 0808   PT Stop Time 0853   PT Time Calculation (min) 45 min   Activity Tolerance Patient tolerated treatment well   Behavior During Therapy [facility] for tasks assessed/performed      Past Medical History  Diagnosis Date  . GERD (gastroesophageal reflux disease)   . Hypertension   . Diabetes mellitus without complication   . Migraine headache   . Degenerative disc disease   . Chronic pain   . Fibromyalgia   . Reflux   . Plantar fasciitis   . PONV (postoperative nausea and vomiting)   . Depression     Past Surgical History  Procedure Laterality Date  . Bilateral carpal tunnel release    . Right rotator cuff repair    . Nose surgery    . Colonoscopy  06/08/2011    Procedure: COLONOSCOPY;  Surgeon: Arlyce HarmanSandi M Fields, MD;  [facility];  Service: Endoscopy;  Laterality: N/A;  11:15 AM  . Knee arthroscopy with medial menisectomy Left 09/06/2014    Procedure: KNEE ARTHROSCOPY WITH LIMITED DEBRIDEMENT;  Surgeon: Vickki HearingStanley E Harrison, MD;  Location: [facility];  Service: Orthopedics;  Laterality: Left;    There were no vitals filed for this visit.  Visit Diagnosis:  Knee stiffness, left  Left knee pain  Difficulty walking  Weakness of left lower extremity      Subjective Assessment - 10/18/14 0809    Subjective Pt stated he is sore this morning, stated most difficulty  ambulating up and down stairs at home.  Reports RTW on Monday   Currently in Pain? No/denies   Pain Descriptors / Indicators Sore            [facility] PT Assessment - 10/18/14 0001    Assessment   Medical Diagnosis s/p Lt knee arthroscopic   Onset Date 09/06/14   Next MD Visit Harrison,11/04/2014   Prior Therapy No              [facility] Adult PT Treatment/Exercise - 10/18/14 0001    Knee/Hip Exercises: Stretches   Quad Stretch 3 reps;30 seconds   Quad Stretch Limitations prone with rope; Manual Thomas stretch on edge of mat   Hip Flexor Stretch 3 reps;30 seconds   Hip Flexor Stretch Limitations 2nd step on stairs   Knee: Self-Stretch Limitations 10reps 3 way on 2nd step for flexion    Knee/Hip Exercises: Standing   Forward Lunges Both;5 reps   Forward Lunges Limitations lunge matrix common   Lateral Step Up Left;15 reps   Lateral Step Up Limitations 7in step   Functional Squat 10 reps   Functional Squat Limitations proper lifting 60# floor to waist   Stairs 5RT with focus on eccentric control   Other Standing Knee Exercises RTW: 1/2 kneel to stand on airex 5x each; Crawling all directions 2RT; ladder 5RT; Proper lifting 50#  floor to overhead 2x             PT Short Term Goals - 10/18/14 0817    PT SHORT TERM GOAL #1   Title Patient will be able to extend Lt knee to  0 degrees to  be able to ambulate with full knee extension.    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    Status On-going   PT LONG TERM GOAL #3   Title Patient will dmeonstrate 5/5 MMT for Lt LE strength so patient can lift 100lb from the floor as needed for return to work.    Status On-going   PT LONG TERM GOAL #4   Title patient will be independent with HEP.    Status Achieved   PT LONG TERM GOAL #5   Title patient to be able to ambualte up and down stairs without pain indicatign glut max strength 5/5/ MMT.    Status On-going               Plan - 10/18/14 0909    Clinical Impression Statement Continued session focus on improving flexibilty to improve AROM to make symmetrical with opposite knee.  Continued stair training with cueing to improve eccentric control descending stairs and gluteal strengthening with proper lifting and common lunge matrix  Pt able to demonstrate and verbalize appropriate mechanics with lifting floor to waist and over head.  Began RTW activities including kneelings, crawling and ladder, all complete with minimal difficulty.  Pt stated slight increase in pain following advanced activtieis and will not be going home due to medical issues with wife so give ice at end of session for pain and edema control      PT Next Visit Plan Stretches to focus on rectus femoris and quadriceps and hip flexor ROM and strengtheing on glutes with focus on decreasing pain durign stiar ambulation. Assess RTW next session.          Problem List Patient Active Problem List   Diagnosis Date Noted  . Other synovitis and tenosynovitis, unspecified lower leg   . Chronic pain syndrome 06/24/2014  . Abnormal fasting glucose 02/11/2014  . Fibromyalgia 09/29/2013  . Other malaise and fatigue 01/09/2013  . Chest pain 07/17/2012  . Essential hypertension 07/17/2012  . Arthritis 07/17/2012   Becky Sax, LPTA; [place] #[address] [phone]  Juel Burrow 10/18/2014, 10:05 AM  [facility] [address] [place], [place], [address] Phone: [phone]   Fax:  [phone]

## 2014-10-21 ENCOUNTER — Ambulatory Visit (HOSPITAL_COMMUNITY): Payer: 59

## 2014-10-21 DIAGNOSIS — M25562 Pain in left knee: Secondary | ICD-10-CM | POA: Diagnosis not present

## 2014-10-21 DIAGNOSIS — R262 Difficulty in walking, not elsewhere classified: Secondary | ICD-10-CM

## 2014-10-21 DIAGNOSIS — M25662 Stiffness of left knee, not elsewhere classified: Secondary | ICD-10-CM

## 2014-10-21 DIAGNOSIS — R29898 Other symptoms and signs involving the musculoskeletal system: Secondary | ICD-10-CM

## 2014-10-21 NOTE — Therapy (Signed)
[facility] [address] [place], [place], [address] Phone: [phone]   Fax:  [phone]  Physical Therapy Treatment  Patient Details  Name: Vernon KetoRobert L Toney MRN: [id number] Date of Birth: September 15, 1955 Referring Provider:  Vickki HearingHarrison, Stanley E, MD  Encounter Date: 10/21/2014      PT End of Session - 10/21/14 0820    Visit Number 11   Number of Visits 16   Date for PT Re-Evaluation 11/08/14   Authorization Type UHC   Authorization Time Period 09/08/2014-11/08/2014   Authorization - Visit Number 11   Authorization - Number of Visits 16   PT Start Time 0807   PT Stop Time 0842   PT Time Calculation (min) 35 min   Activity Tolerance Patient tolerated treatment well   Behavior During Therapy [facility] for tasks assessed/performed      Past Medical History  Diagnosis Date  . GERD (gastroesophageal reflux disease)   . Hypertension   . Diabetes mellitus without complication   . Migraine headache   . Degenerative disc disease   . Chronic pain   . Fibromyalgia   . Reflux   . Plantar fasciitis   . PONV (postoperative nausea and vomiting)   . Depression     Past Surgical History  Procedure Laterality Date  . Bilateral carpal tunnel release    . Right rotator cuff repair    . Nose surgery    . Colonoscopy  06/08/2011    Procedure: COLONOSCOPY;  Surgeon: Arlyce HarmanSandi M Fields, MD;  [facility];  Service: Endoscopy;  Laterality: N/A;  11:15 AM  . Knee arthroscopy with medial menisectomy Left 09/06/2014    Procedure: KNEE ARTHROSCOPY WITH LIMITED DEBRIDEMENT;  Surgeon: Vickki HearingStanley E Harrison, MD;  Location: [facility];  Service: Orthopedics;  Laterality: Left;    There were no vitals filed for this visit.  Visit Diagnosis:  Knee stiffness, left  Left knee pain  Difficulty walking  Weakness of left lower extremity      Subjective Assessment - 10/21/14 0807    Subjective Pt stated his knee is sore this morning following the kneeling on  foam last session, c/o Lt hip pain scale 3/10.  Pt supposed to RTW today but has alot on his plate with wife reaction with Chemo and son sent to ER this weekend, believes he injured his hip helping his 200# son out of the bathtub.   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 Notes a long history of low back pain that appears to be resistance to all forms of previous treatment. Notes Lt knee always hurts but pain improved with rest. Patient feels pressured to return to work but wants to hold off until knee is better. atent's job requires walking up ladders, crawling,  Notes Rt knee pain recently secondary to likely compensation.    Currently in Pain? Yes   Pain Score 3    Pain Location Hip   Pain Orientation Left   Pain Descriptors / Indicators Sore                       [facility] Adult PT Treatment/Exercise - 10/21/14 0001    Exercises   Exercises Knee/Hip   Knee/Hip Exercises: Stretches   Active Hamstring Stretch 3 reps;30 seconds   Active Hamstring Stretch Limitations 3 way to 14"    Quad Stretch 3 reps;30 seconds  Quad Stretch Limitations prone with rope; Manual Thomas stretch on edge of mat   Hip Flexor Stretch 3 reps;30 seconds   Hip Flexor Stretch Limitations 2nd step on stairs   Piriformis Stretch 2 reps;30 seconds   Piriformis Stretch Limitations seated   Knee/Hip Exercises: Standing   Lateral Step Up Left;15 reps   Lateral Step Up Limitations 7in step   Lunge Walking - Round Trips 1RT   Stairs 5RT with focus on eccentric control no HHA   Other Standing Knee Exercises 3D hip excursions split stance 1x10                  PT Short Term Goals - 10/21/14 1610    PT SHORT TERM GOAL #1   Title Patient will be able to extend Lt knee to  0 degrees to  be able to ambulate with full knee extension.    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    Status On-going   PT LONG TERM GOAL #3   Title Patient will dmeonstrate 5/5 MMT for Lt LE strength so patient can lift 100lb from the floor as needed for return to work.    PT LONG TERM GOAL #4   Title patient will be independent with HEP.    Status Achieved   PT LONG TERM GOAL #5   Title patient to be able to ambualte up and down stairs without pain indicatign glut max strength 5/5/ MMT.    Status On-going               Plan - 10/21/14 9604    Clinical Impression Statement Session focus on improving flexibilty to improve hip mobility and ROM to Lt knee.  Continued stair training with cueing to improve eccentric control descending stairs and gluteal strengthening.  Pt able to demonstrate good form with squats with no cueing required. Began lunge walking as a progression towards kneeling following reports of sorenesss following last session.     PT Next Visit Plan Stretches to focus on rectus femoris and quadriceps and hip flexor ROM and strengtheing on glutes with focus on decreasing pain durign stiar ambulation. Assess RTW next session.          Problem List Patient Active Problem List   Diagnosis Date Noted  . Other synovitis and tenosynovitis, unspecified lower leg   . Chronic pain syndrome 06/24/2014  . Abnormal fasting glucose 02/11/2014  . Fibromyalgia 09/29/2013  . Other malaise and fatigue 01/09/2013  . Chest pain 07/17/2012   . Essential hypertension 07/17/2012  . Arthritis 07/17/2012   Becky Sax, LPTA; [place] #54098 [phone]  Juel Burrow 10/21/2014, 8:46 AM  [place]  [facility] [place], [place], [address] Phone: [phone]   Fax:  [phone]

## 2014-10-23 ENCOUNTER — Ambulatory Visit (HOSPITAL_COMMUNITY): Payer: 59

## 2014-10-23 DIAGNOSIS — M25562 Pain in left knee: Secondary | ICD-10-CM | POA: Diagnosis not present

## 2014-10-23 DIAGNOSIS — R262 Difficulty in walking, not elsewhere classified: Secondary | ICD-10-CM

## 2014-10-23 DIAGNOSIS — M25662 Stiffness of left knee, not elsewhere classified: Secondary | ICD-10-CM

## 2014-10-23 DIAGNOSIS — R29898 Other symptoms and signs involving the musculoskeletal system: Secondary | ICD-10-CM

## 2014-10-23 NOTE — Therapy (Signed)
[facility] [address] [place], [place], [address] Phone: [phone]   Fax:  [phone]  Physical Therapy Treatment  Patient Details  Name: Vernon Fisher MRN: [id number] Date of Birth: [birth year] Referring Provider:  Vickki HearingHarrison, Stanley E, MD  Encounter Date: 10/23/2014      PT End of Session - 10/23/14 0826    Visit Number 12   Number of Visits 16   Date for PT Re-Evaluation 11/08/14   Authorization Type UHC   Authorization Time Period 09/08/2014-11/08/2014   Authorization - Visit Number 12   Authorization - Number of Visits 16   PT Start Time 0802   PT Stop Time 0845   PT Time Calculation (min) 43 min   Activity Tolerance Patient tolerated treatment well   Behavior During Therapy [facility] for tasks assessed/performed      Past Medical History  Diagnosis Date  . GERD (gastroesophageal reflux disease)   . Hypertension   . Diabetes mellitus without complication   . Migraine headache   . Degenerative disc disease   . Chronic pain   . Fibromyalgia   . Reflux   . Plantar fasciitis   . PONV (postoperative nausea and vomiting)   . Depression     Past Surgical History  Procedure Laterality Date  . Bilateral carpal tunnel release    . Right rotator cuff repair    . Nose surgery    . Colonoscopy  06/08/2011    Procedure: COLONOSCOPY;  Surgeon: Arlyce HarmanSandi M Fields, MD;  [facility];  Service: Endoscopy;  Laterality: N/A;  11:15 AM  . Knee arthroscopy with medial menisectomy Left 09/06/2014    Procedure: KNEE ARTHROSCOPY WITH LIMITED DEBRIDEMENT;  Surgeon: Vickki HearingStanley E Harrison, MD;  Location: [facility];  Service: Orthopedics;  Laterality: Left;    There were no vitals filed for this visit.  Visit Diagnosis:  Knee stiffness, left  Left knee pain  Difficulty walking  Weakness of left lower extremity      Subjective Assessment - 10/23/14 0805    Subjective Pt stated knee is feeling good today, overall pain scale 2/10.   Pt RTW yesterday, reports standing was hardest part.  Pt had to lift a radiator out of vehicle, did a lot of standing and walker several miles.     Currently in Pain? Yes   Pain Score 2    Pain Location Knee   Pain Orientation Left   Pain Descriptors / Indicators Aching            [facility] PT Assessment - 10/23/14 0001    Assessment   Medical Diagnosis s/p Lt knee arthroscopic   Onset Date 09/06/14   Next MD Visit Harrison,11/04/2014   Prior Therapy No                     [facility] Adult PT Treatment/Exercise - 10/23/14 0001    Exercises   Exercises Knee/Hip   Knee/Hip Exercises: Stretches   Active Hamstring Stretch 3 reps;30 seconds   Active Hamstring Stretch Limitations 3 way to 14"    Quad Stretch 3 reps;30 seconds   Quad Stretch Limitations prone with rope; Manual Thomas stretch on edge of mat   Hip Flexor Stretch 2 reps;30 seconds   Hip Flexor Stretch Limitations 2 directions 14in step   Piriformis Stretch 3 reps;30 seconds   Piriformis Stretch Limitations seated   Gastroc Stretch 3 reps;30 seconds   Gastroc Stretch Limitations slant board   Knee/Hip  Exercises: Standing   Lateral Step Up Left;15 reps   Lateral Step Up Limitations 7in step   Lunge Walking - Round Trips 1RT   Stairs 5RT with focus on eccentric control no HHA   Other Standing Knee Exercises 4way pick up                  PT Short Term Goals - 10/23/14 1610    PT SHORT TERM GOAL #1   Title Patient will be able to extend Lt knee to  0 degrees to  be able to ambulate with full knee extension.    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    Status On-going   PT LONG TERM GOAL #3   Title Patient will dmeonstrate 5/5 MMT for Lt LE strength so patient can lift 100lb from the floor as needed for return to work.    Status On-going   PT LONG TERM GOAL #4   Title patient will be independent with HEP.    Status Achieved   PT LONG TERM GOAL #5   Title patient to be able to ambualte up and down stairs without pain indicatign glut max strength 5/5/ MMT.    Status On-going               Plan - 10/23/14 9604    Clinical Impression Statement Began 4 way pick up to improve lifting techniques and hip mobiltiy for RTW activities.  Continued stretches to improve flexibiilty to improve Lt knee ROM and stair training with improved eccentric control descending stairs without cueing required.  Improved mechanics with lunge walking as a progressing towards kneeling activity   PT Next Visit Plan Stretches to focus on rectus femoris and quadriceps and hip flexor ROM and strengtheing on glutes with focus on decreasing pain durign stiar ambulation. Assess RTW next session.          Problem List Patient Active Problem List   Diagnosis Date Noted  . Other synovitis and tenosynovitis, unspecified lower leg   . Chronic pain syndrome 06/24/2014  . Abnormal fasting glucose 02/11/2014  . Fibromyalgia 09/29/2013  . Other malaise and fatigue 01/09/2013  . Chest pain 07/17/2012  . Essential hypertension 07/17/2012  . Arthritis 07/17/2012   Becky Sax, LPTA; [place] #54098 [phone]  Juel Burrow 10/23/2014, 8:49 AM  [place] [facility] [address] [place], [place], [address] Phone: [phone]   Fax:  [phone]

## 2014-10-24 ENCOUNTER — Ambulatory Visit (INDEPENDENT_AMBULATORY_CARE_PROVIDER_SITE_OTHER): Payer: 59 | Admitting: Family Medicine

## 2014-10-24 ENCOUNTER — Encounter: Payer: Self-pay | Admitting: Family Medicine

## 2014-10-24 VITALS — BP 138/86 | Temp 99.0°F | Ht 71.0 in | Wt 221.0 lb

## 2014-10-24 DIAGNOSIS — J329 Chronic sinusitis, unspecified: Secondary | ICD-10-CM | POA: Diagnosis not present

## 2014-10-24 DIAGNOSIS — J310 Chronic rhinitis: Secondary | ICD-10-CM

## 2014-10-24 MED ORDER — LORAZEPAM 1 MG PO TABS: 1.0000 mg | ORAL_TABLET | Freq: Every evening | ORAL | Status: DC | PRN

## 2014-10-24 MED ORDER — AMOXICILLIN-POT CLAVULANATE 875-125 MG PO TABS: 1.0000 | ORAL_TABLET | Freq: Two times a day (BID) | ORAL | Status: DC

## 2014-10-24 NOTE — Progress Notes (Signed)
   Subjective:    Patient ID: Vernon Fisher, male    DOB: 04-May-1956, 59 y.o.   MRN: [id number]  Cough This is a new problem. Episode onset: 3 days. Associated symptoms include ear pain, headaches, nasal congestion, a sore throat and wheezing.   Lorazepam is on pt's med list. Pt states he never received this med but would like to get it because he does have trouble sleeping.   Headache and cough and congestion, dim energy  coughin up phlegm , not running hi fever but low gr  Some frontal headache and diminished energy  Review of Systems  HENT: Positive for ear pain and sore throat.   Respiratory: Positive for cough and wheezing.   Neurological: Positive for headaches.   No vomiting no diarrhea    Objective:   Physical Exam Alert moderate malaise H&T moderate nasal congestion discharge trace normal neck supple lungs bronchial cough heart regular in rhythm       Assessment & Plan:  Impression rhinosinusitis/bronchitis plan antibiotics prescribed. Symptom care discussed warning signs discussed WSL

## 2014-10-25 ENCOUNTER — Telehealth: Payer: Self-pay | Admitting: Family Medicine

## 2014-10-25 ENCOUNTER — Ambulatory Visit (HOSPITAL_COMMUNITY): Payer: 59 | Admitting: Physical Therapy

## 2014-10-25 ENCOUNTER — Telehealth (HOSPITAL_COMMUNITY): Payer: Self-pay | Admitting: Physical Therapy

## 2014-10-25 MED ORDER — BENZONATATE 100 MG PO CAPS
100.0000 mg | ORAL_CAPSULE | Freq: Four times a day (QID) | ORAL | Status: DC | PRN
Start: 2014-10-25 — End: 2015-04-29

## 2014-10-25 NOTE — Telephone Encounter (Signed)
Patient says that he was seen yesterday by Dr. Brett CanalesSteve and given an antibiotic for ear pain, congestion, sore throat, wheezing.  He says that he coughed all night long and is requesting something to control his cough.   [facility] [place]

## 2014-10-25 NOTE — Telephone Encounter (Signed)
Tess perles 100 mg one q 6 hrs prn num 30

## 2014-10-25 NOTE — Telephone Encounter (Signed)
Patient called by therapist. Patient stated he is sick and unable to come in. Patient instructed by therapist to call if he will not be able to come in on Monday for his 8:00am appointment.   Jerilee Fieldash Ziara Thelander PT DPT [phone]

## 2014-10-25 NOTE — Telephone Encounter (Signed)
Med sent to pharmacy. Patient was notified.  

## 2014-10-28 ENCOUNTER — Ambulatory Visit (HOSPITAL_COMMUNITY): Payer: 59 | Admitting: Physical Therapy

## 2014-10-30 ENCOUNTER — Ambulatory Visit (INDEPENDENT_AMBULATORY_CARE_PROVIDER_SITE_OTHER): Payer: 59 | Admitting: Family Medicine

## 2014-10-30 ENCOUNTER — Encounter: Payer: Self-pay | Admitting: Family Medicine

## 2014-10-30 ENCOUNTER — Ambulatory Visit (HOSPITAL_COMMUNITY): Payer: 59 | Admitting: Physical Therapy

## 2014-10-30 VITALS — BP 118/72 | Ht 71.0 in | Wt 217.0 lb

## 2014-10-30 DIAGNOSIS — G894 Chronic pain syndrome: Secondary | ICD-10-CM | POA: Diagnosis not present

## 2014-10-30 DIAGNOSIS — I10 Essential (primary) hypertension: Secondary | ICD-10-CM | POA: Diagnosis not present

## 2014-10-30 DIAGNOSIS — M797 Fibromyalgia: Secondary | ICD-10-CM

## 2014-10-30 MED ORDER — ESCITALOPRAM OXALATE 10 MG PO TABS: 10.0000 mg | ORAL_TABLET | Freq: Every day | ORAL | Status: DC

## 2014-10-30 MED ORDER — LORAZEPAM 1 MG PO TABS: 1.0000 mg | ORAL_TABLET | Freq: Every evening | ORAL | Status: DC | PRN

## 2014-10-30 MED ORDER — ENALAPRIL MALEATE 10 MG PO TABS: ORAL_TABLET | ORAL | Status: DC

## 2014-10-30 MED ORDER — HYDROCORTISONE 2.5 % RE CREA: 1.0000 "application " | TOPICAL_CREAM | Freq: Three times a day (TID) | RECTAL | Status: DC

## 2014-10-30 MED ORDER — CELECOXIB 100 MG PO CAPS: 100.0000 mg | ORAL_CAPSULE | Freq: Every day | ORAL | Status: DC

## 2014-10-30 MED ORDER — PANTOPRAZOLE SODIUM 40 MG PO TBEC: DELAYED_RELEASE_TABLET | ORAL | Status: DC

## 2014-10-30 NOTE — Progress Notes (Signed)
   Subjective:    Patient ID: Vernon Fisher, male    DOB: 05/13/1956, 59 y.o.   MRN: [id number]  Hypertension This is a chronic problem. The current episode started more than 1 year ago. Risk factors for coronary artery disease include male gender. Treatments tried: vasotec. There are no compliance problems.     Cough and cong still persisiyting still on antibiotics. Overall handling him well. Energy somewhat improved  BP not too hi, all generally good when checked elsewhere.   Exercising some but not as much as he would like  Sees ortho knee overll improved   now seen a specialist for his chronic pain     reflux generally stable. Needs to take medication. Under decent control.    Review of Systems  ongoing joint pain and muscle pain somewhat diminished energy no headache no change in bowel habits alert    Objective:   Physical Exam  alert vitals stable blood pressure good on repeat 134/86 HEENT normal. Lungs clear. Heart regular in rhythm.       Assessment & Plan:   impression 1 hypertension good control with current efforts #2 reflux ongoing #3 depression stable per patient  #4 chronic pain discussed #5 fibromyalgia discussed 25 minutes spent most in discussion also discussed tremendous stress within family at this time due to wife's breast cancer and sons drug abuse.plan diet exercise discussed.  Salt check in 6 months WSL

## 2014-11-01 ENCOUNTER — Ambulatory Visit (HOSPITAL_COMMUNITY): Payer: 59

## 2014-11-01 DIAGNOSIS — M25662 Stiffness of left knee, not elsewhere classified: Secondary | ICD-10-CM

## 2014-11-01 DIAGNOSIS — R29898 Other symptoms and signs involving the musculoskeletal system: Secondary | ICD-10-CM

## 2014-11-01 DIAGNOSIS — R262 Difficulty in walking, not elsewhere classified: Secondary | ICD-10-CM

## 2014-11-01 DIAGNOSIS — M25562 Pain in left knee: Secondary | ICD-10-CM

## 2014-11-01 NOTE — Therapy (Signed)
[place], [place], [address] Phone: [phone]   Fax:  [phone]  Physical Therapy Treatment  Patient Details  Name: Vernon Fisher MRN: [id number] Date of Birth: [birth year] Referring Provider:  Carole Civil, MD  Encounter Date: 11/01/2014      PT End of Session - 11/01/14 0925    Visit Number 13   Number of Visits 16   Date for PT Re-Evaluation 11/08/14   Authorization Type UHC   Authorization Time Period 09/08/2014-11/08/2014   Authorization - Visit Number 13   Authorization - Number of Visits 16   PT Start Time 0848   PT Stop Time 0930   PT Time Calculation (min) 42 min   Activity Tolerance Patient tolerated treatment well   Behavior During Therapy [facility] for tasks assessed/performed      Past Medical History  Diagnosis Date  . GERD (gastroesophageal reflux disease)   . Hypertension   . Diabetes mellitus without complication   . Migraine headache   . Degenerative disc disease   . Chronic pain   . Fibromyalgia   . Reflux   . Plantar fasciitis   . PONV (postoperative nausea and vomiting)   . Depression     Past Surgical History  Procedure Laterality Date  . Bilateral carpal tunnel release    . Right rotator cuff repair    . Nose surgery    . Colonoscopy  06/08/2011    Procedure: COLONOSCOPY;  Surgeon: Dorothyann Peng, MD;  [facility];  Service: Endoscopy;  Laterality: N/A;  11:15 AM  . Knee arthroscopy with medial menisectomy Left 09/06/2014    Procedure: KNEE ARTHROSCOPY WITH LIMITED DEBRIDEMENT;  Surgeon: Carole Civil, MD;  Location: [facility];  Service: Orthopedics;  Laterality: Left;    There were no vitals filed for this visit.  Visit Diagnosis:  Knee stiffness, left  Left knee pain  Difficulty walking  Weakness of left lower extremity      Subjective Assessment - 11/01/14 0849    Subjective Pt stated Lt knee is hurting lateral and distal patella acheing  tightness.  Pt c/o couching, been to MD and taken medications but continues to couch. Pt only able to make it to work 1 day last week, has returned every day this week.  Biggest problem is standing in one place, c/o back pain.     Currently in Pain? Yes   Pain Score 2    Pain Location Knee   Pain Orientation Left;Anterior;Lateral   Pain Descriptors / Indicators Aching            [facility] PT Assessment - 11/01/14 0001    Assessment   Medical Diagnosis s/p Lt knee arthroscopic   Onset Date 09/06/14   Next MD Visit Harrison,11/04/2014   Prior Therapy No   AROM   Left Hip External Rotation  46  was 46   Left Hip Internal Rotation  41  was 36   Right Knee Extension 0   Right Knee Flexion 130   Left Knee Extension 0   Left Knee Flexion 132  was 126   Right/Left Ankle Right;Left   Right Ankle Dorsiflexion --  was 17   Left Ankle Dorsiflexion --  was 14   Strength   Right Hip Flexion 5/5   Left Hip Flexion 5/5   Left Hip Extension 4+/5  was 4/5   Left Hip ABduction 5/5  was 4+/5   Right Knee Flexion  5/5   Right Knee Extension 5/5   Left Knee Flexion 5/5   Left Knee Extension 5/5   Right Ankle Dorsiflexion 5/5   Left Ankle Dorsiflexion 5/5   Flexibility   Soft Tissue Assessment /Muscle Length yes   Quadriceps Positive thomas test and Ely's test whn cued for rectus femoris.                      [place] Adult PT Treatment/Exercise - 11/01/14 0001    Exercises   Exercises Knee/Hip   Knee/Hip Exercises: Stretches   Active Hamstring Stretch 3 reps;30 seconds   Active Hamstring Stretch Limitations 3 way to 14"    Quad Stretch 3 reps;30 seconds   Quad Stretch Limitations prone with rope; Manual Thomas stretch on edge of mat   Hip Flexor Stretch 2 reps;30 seconds   Hip Flexor Stretch Limitations 2 directions 14in step   Gastroc Stretch 3 reps;30 seconds   Gastroc Stretch Limitations slant board   Knee/Hip Exercises: Standing   Forward Lunges Both;5 reps    Forward Lunges Limitations lunge matrix common   Lateral Step Up Left;15 reps   Lateral Step Up Limitations 7in step   Functional Squat 5 reps   Functional Squat Limitations proper lifting 60# floor to waist   Stairs 5RT with focus on eccentric control no HHA   Other Standing Knee Exercises 4way pick up 10#                PT Education - 11/01/14 0933    Education provided Yes   Education Details Given advanced HEP with lunges   Person(s) Educated Patient   Methods Explanation;Demonstration;Handout   Comprehension Verbalized understanding;Returned demonstration          PT Short Term Goals - 11/01/14 0913    PT SHORT TERM GOAL #1   Title Patient will be able to extend Lt knee to  0 degrees to  be able to ambulate with full knee extension.    Baseline 09/24/2014 Lt knee ROM 1-140 degrees    Status Achieved   PT SHORT TERM GOAL #2   Title Patient will be able to flex knee to 100 degrees to be able to squat to high chair    Baseline 03/22/2016Lt  knee ROM 1-140 degrees   Status Achieved   PT SHORT TERM GOAL #3   Title patient will be able to demosntrate 4/5 quad and hamstring strength in Lt knee to be able to squat to and from a chair of normal height   Status Achieved           PT Long Term Goals - 11/01/14 0914    PT LONG TERM GOAL #1   Title Patient will be able to walk 61minutes without pain   Baseline 11/01/2014 Able to walk for 30-60 minutes without pain, c/o back and hip pain with prolonged walking periods   Status Partially Met   PT LONG TERM GOAL #2   Title Patiwent will be able to flex Lt knee 130 degrees 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    Baseline 09/24/2014 Lt knee AROM 1-140 degrees   Status Achieved   PT LONG TERM GOAL #3   Title Patient will dmeonstrate 5/5 MMT for Lt LE strength so patient can lift 100lb from the floor as needed for return to work.    Baseline Pt stated ability to lift 100# comfortably with RTW   PT LONG TERM GOAL  #  4   Title patient will be independent with HEP.    Baseline Reports compliance with HEP daily   Status Achieved   PT LONG TERM GOAL #5   Title patient to be able to ambualte up and down stairs without pain indicatign glut max strength 5/5/ MMT.    Baseline Glut max 4+/5, able to ambulate up and down stairs without pain   Status Partially Met               Plan - 11/01/14 0936    Clinical Impression Statement Reviewed goals prior MD apt, pt progressing well with improved strength and ROM with gluteal maximus only weak musculature remaining.  Pt reported increased pain with RTW and unsure amount of time able to ambulate for.  Pt able to verbalize proper lifting techniques but does require cueing  to reduce lumbar flexion when lifting.  Session focus on improving hip mobility and functional strengthening.  No reports of pain at end of session.   PT Next Visit Plan F/U with MD.  Progress gluteal strengthening, rectrus femoris flexibilty and RTW activities.        Problem List Patient Active Problem List   Diagnosis Date Noted  . Other synovitis and tenosynovitis, unspecified lower leg   . Chronic pain syndrome 06/24/2014  . Abnormal fasting glucose 02/11/2014  . Fibromyalgia 09/29/2013  . Other malaise and fatigue 01/09/2013  . Chest pain 07/17/2012  . Essential hypertension 07/17/2012  . Arthritis 07/17/2012   Ihor Austin, [place]; [place] #[id number] [phone]  Aldona Lento 11/01/2014, 11:20 AM  [place] [address] [place], [place], [address] Phone: [phone]   Fax:  [phone]   Devona Konig PT DPT [phone]

## 2014-11-01 NOTE — Patient Instructions (Signed)
Forward Lunge   Standing with feet shoulder width apart and stomach tight, step forward with left leg. Repeat 10-20 times per set. Do 1-2 sets per session. Do 3-5 sessions per day.  http://orth.exer.us/1146   Copyright  VHI. All rights reserved.   Lateral Hip Dominant Lunge   Hands clasped behind head, lunge to side, keeping erect posture. Bend knee as much as 90. Return by stepping to moving leg with other. Lunge 10-20  times with same leg, then with other. Do 1-2 sessions per day.   Copyright  VHI. All rights reserved.

## 2014-11-04 ENCOUNTER — Ambulatory Visit (INDEPENDENT_AMBULATORY_CARE_PROVIDER_SITE_OTHER): Payer: 59 | Admitting: Orthopedic Surgery

## 2014-11-04 ENCOUNTER — Encounter: Payer: Self-pay | Admitting: Orthopedic Surgery

## 2014-11-04 VITALS — BP 117/79 | Ht 71.0 in | Wt 217.0 lb

## 2014-11-04 DIAGNOSIS — Z9889 Other specified postprocedural states: Secondary | ICD-10-CM

## 2014-11-04 NOTE — Progress Notes (Signed)
Patient ID: Vernon Fisher, male   DOB: 1955/09/27, 59 y.o.   MRN: [id number] Postop visit  Surgery March 4  Operative findings free edge tear medial meniscus mild chondromalacia medial femoral condyle  Patient doing well except for some soreness. Experiencing no swelling catching locking or giving way  Today's exam shows a benign knee without effusion full range of motion negative McMurray's no tenderness  The patient is released to follow-up as needed for the knee  Note wants to have evaluation of his left shoulder.

## 2014-11-06 ENCOUNTER — Ambulatory Visit (HOSPITAL_COMMUNITY): Payer: 59 | Attending: Orthopedic Surgery

## 2014-11-06 DIAGNOSIS — R262 Difficulty in walking, not elsewhere classified: Secondary | ICD-10-CM

## 2014-11-06 DIAGNOSIS — M25562 Pain in left knee: Secondary | ICD-10-CM | POA: Diagnosis present

## 2014-11-06 DIAGNOSIS — M6281 Muscle weakness (generalized): Secondary | ICD-10-CM | POA: Insufficient documentation

## 2014-11-06 DIAGNOSIS — R29898 Other symptoms and signs involving the musculoskeletal system: Secondary | ICD-10-CM

## 2014-11-06 DIAGNOSIS — M25662 Stiffness of left knee, not elsewhere classified: Secondary | ICD-10-CM

## 2014-11-06 NOTE — Therapy (Signed)
[place], [place], [address] Phone: [phone]   Fax:  [phone]  Physical Therapy Treatment  Patient Details  Name: Vernon Fisher MRN: [id number] Date of Birth: 26-Sep-1955 Referring Provider:  Carole Civil, MD  Encounter Date: 11/06/2014      PT End of Session - 11/06/14 0817    Visit Number 14   Number of Visits 16   Date for PT Re-Evaluation 11/08/14   Authorization Type UHC   Authorization Time Period 09/08/2014-11/08/2014   Authorization - Visit Number 14   Authorization - Number of Visits 16   PT Start Time 0805   PT Stop Time 0845   PT Time Calculation (min) 40 min   Activity Tolerance Patient tolerated treatment well   Behavior During Therapy [facility] for tasks assessed/performed      Past Medical History  Diagnosis Date  . GERD (gastroesophageal reflux disease)   . Hypertension   . Diabetes mellitus without complication   . Migraine headache   . Degenerative disc disease   . Chronic pain   . Fibromyalgia   . Reflux   . Plantar fasciitis   . PONV (postoperative nausea and vomiting)   . Depression     Past Surgical History  Procedure Laterality Date  . Bilateral carpal tunnel release    . Right rotator cuff repair    . Nose surgery    . Colonoscopy  06/08/2011    Procedure: COLONOSCOPY;  Surgeon: Dorothyann Peng, MD;  [facility];  Service: Endoscopy;  Laterality: N/A;  11:15 AM  . Knee arthroscopy with medial menisectomy Left 09/06/2014    Procedure: KNEE ARTHROSCOPY WITH LIMITED DEBRIDEMENT;  Surgeon: Carole Civil, MD;  Location: [facility];  Service: Orthopedics;  Laterality: Left;    There were no vitals filed for this visit.  Visit Diagnosis:  Knee stiffness, left  Left knee pain  Difficulty walking  Weakness of left lower extremity      Subjective Assessment - 11/06/14 0809    Subjective Pt stated he feels confident with RTW, most difficulty part is  standing.  Pt stated no problem with RTW activities including climbing ladder, lifting, crawling, etc.   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 Notes a long history of low back pain that appears to be resistance to all forms of previous treatment. Notes Lt knee always hurts but pain improved with rest. Patient feels pressured to return to work but wants to hold off until knee is better. atent's job requires walking up ladders, crawling,  Notes Rt knee pain recently secondary to likely compensation.    How long can you sit comfortably? Able to sit comfortably for an hour or 2   How long can you stand comfortably? Able to stand comfortably for 15-20 minutes   How long can you walk comfortably? Able to walk comfortably for at least 30 minutes   Currently in Pain? Yes   Pain Score 7    Pain Location Hip   Pain Orientation Right   Pain Descriptors / Indicators Stabbing;Throbbing     FOTO status: 99.12% (was 74% limited)      [facility] Adult PT Treatment/Exercise - 11/06/14 0001    Exercises   Exercises Knee/Hip   Knee/Hip Exercises: Stretches   Active Hamstring Stretch 3 reps;30 seconds   Active Hamstring Stretch Limitations 3 way to 14"  Quad Stretch 3 reps;30 seconds   Quad Stretch Limitations standing   Hip Flexor Stretch 2 reps;30 seconds   Hip Flexor Stretch Limitations 2 directions 14in step   Piriformis Stretch 3 reps;30 seconds   Piriformis Stretch Limitations seated   Gastroc Stretch 3 reps;30 seconds   Gastroc Stretch Limitations 1 rep slant board, 2 reps against wall   Knee/Hip Exercises: Standing   Forward Lunges Both;5 reps   Forward Lunges Limitations lunge matrix common   Functional Squat 5 reps   Functional Squat Limitations proper lifting 60# floor to waist   Lunge Walking - Round Trips 1RT   Other Standing Knee Exercises 4way pick up 10#               PT Short Term  Goals - 11/06/14 0817    PT SHORT TERM GOAL #1   Title Patient will be able to extend Lt knee to  0 degrees to  be able to ambulate with full knee extension.    Baseline 09/24/2014 Lt knee ROM 1-140 degrees    Status Achieved   PT SHORT TERM GOAL #2   Title Patient will be able to flex knee to 100 degrees to be able to squat to high chair    Baseline 03/22/2016Lt  knee ROM 1-140 degrees   Status Achieved   PT SHORT TERM GOAL #3   Title patient will be able to demosntrate 4/5 quad and hamstring strength in Lt knee to be able to squat to and from a chair of normal height   Status Achieved           PT Long Term Goals - 11/06/14 0817    PT LONG TERM GOAL #1   Title Patient will be able to walk 63minutes without pain   Baseline 11/01/2014 Able to walk for 30-60 minutes without pain, c/o back and hip pain with prolonged walking periods   Status Partially Met   PT LONG TERM GOAL #2   Title Patiwent will be able to flex Lt knee 130 degrees 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    Baseline 09/24/2014 Lt knee AROM 1-140 degrees   Status Achieved   PT LONG TERM GOAL #3   Title Patient will dmeonstrate 5/5 MMT for Lt LE strength so patient can lift 100lb from the floor as needed for return to work.    Baseline 11/01/2014 Pt stated ability to lift 100# comfortably with RTW   PT LONG TERM GOAL #4   Title patient will be independent with HEP.    Baseline Reports compliance with HEP daily   Status Achieved   PT LONG TERM GOAL #5   Title patient to be able to ambualte up and down stairs without pain indicatign glut max strength 5/5/ MMT.    Baseline Glut max 4+/5, able to ambulate up and down stairs without pain   Status Partially Met               Plan - 11/06/14 0819    Clinical Impression Statement Reviewed goals and discussed advanced HEP with pt.  Pt able to demonstrate and verbalize appropriate form with all exercises.  FOTO complete with status of 99%.  No further  skilled intervention required for pt., he is ready for discharge.   PT Next Visit Plan D/C to HEP per goals met,         Problem List Patient Active Problem List   Diagnosis Date Noted  .  Other synovitis and tenosynovitis, unspecified lower leg   . Chronic pain syndrome 06/24/2014  . Abnormal fasting glucose 02/11/2014  . Fibromyalgia 09/29/2013  . Other malaise and fatigue 01/09/2013  . Chest pain 07/17/2012  . Essential hypertension 07/17/2012  . Arthritis 07/17/2012   Ihor Austin, [place]; [place] #[id number] [phone]  Aldona Lento 11/06/2014, 12:29 PM  [facility] [address] [place], [place], [address] Phone: [phone]   Fax:  [phone]     PHYSICAL THERAPY DISCHARGE SUMMARY  Visits from Start of Care: 14  Current functional level related to goals / functional outcomes: See above   Remaining deficits: See above    Plan: Patient agrees to discharge.  Patient goals were met. Patient is being discharged due to meeting the stated rehab goals.  ?????        Devona Konig PT DPT [phone]

## 2015-04-29 ENCOUNTER — Encounter: Payer: Self-pay | Admitting: Family Medicine

## 2015-04-29 ENCOUNTER — Ambulatory Visit (INDEPENDENT_AMBULATORY_CARE_PROVIDER_SITE_OTHER): Payer: 59 | Admitting: Family Medicine

## 2015-04-29 ENCOUNTER — Other Ambulatory Visit: Payer: Self-pay | Admitting: Family Medicine

## 2015-04-29 ENCOUNTER — Ambulatory Visit (HOSPITAL_COMMUNITY)
Admission: RE | Admit: 2015-04-29 | Discharge: 2015-04-29 | Disposition: A | Discharge status: Home / Self Care | Payer: 59 | Source: Ambulatory Visit | Attending: Family Medicine | Admitting: Family Medicine

## 2015-04-29 VITALS — BP 118/80 | Ht 71.0 in | Wt 221.5 lb

## 2015-04-29 DIAGNOSIS — I10 Essential (primary) hypertension: Secondary | ICD-10-CM | POA: Diagnosis not present

## 2015-04-29 DIAGNOSIS — R7301 Impaired fasting glucose: Secondary | ICD-10-CM

## 2015-04-29 DIAGNOSIS — M797 Fibromyalgia: Secondary | ICD-10-CM | POA: Diagnosis not present

## 2015-04-29 DIAGNOSIS — G894 Chronic pain syndrome: Secondary | ICD-10-CM

## 2015-04-29 DIAGNOSIS — R7309 Other abnormal glucose: Secondary | ICD-10-CM

## 2015-04-29 DIAGNOSIS — M25512 Pain in left shoulder: Secondary | ICD-10-CM | POA: Diagnosis not present

## 2015-04-29 DIAGNOSIS — M898X4 Other specified disorders of bone, hand: Secondary | ICD-10-CM | POA: Insufficient documentation

## 2015-04-29 DIAGNOSIS — M79641 Pain in right hand: Secondary | ICD-10-CM | POA: Diagnosis not present

## 2015-04-29 DIAGNOSIS — Z23 Encounter for immunization: Secondary | ICD-10-CM | POA: Diagnosis not present

## 2015-04-29 MED ORDER — HYDROCODONE-ACETAMINOPHEN 7.5-325 MG PO TABS: ORAL_TABLET | ORAL | Status: DC

## 2015-04-29 MED ORDER — SUCRALFATE 1 G PO TABS: ORAL_TABLET | ORAL | Status: DC

## 2015-04-29 MED ORDER — HYOSCYAMINE SULFATE ER 0.375 MG PO TB12: 0.3750 mg | ORAL_TABLET | Freq: Two times a day (BID) | ORAL | Status: DC

## 2015-04-29 NOTE — Progress Notes (Signed)
   Subjective:    Patient ID: Vernon Fisher, male    DOB: January 12, 1956, 59 y.o.   MRN: [id number] Patient arrives office with a tremendous number of concerns.  Has been going to chronic pain specialist. He is very frustrated because they charges insurance over $500 month. He states her not really helping his writing pain meds. He states he has not had a chance to speak with the doctor. Would like to get his pain medicine here if at all possible. He's cut down his medicine to half a tablet twice.   Hypertension This is a chronic problem. The current episode started more than 1 year ago. The problem has been gradually improving since onset. The problem is controlled. There are no associated agents to hypertension. There are no known risk factors for coronary artery disease. Treatments tried: enalapril. The current treatment provides moderate improvement. There are no compliance problems.    Patient states that he has left shoulder pain. Patient states that this has been present for a long time now. Some shoulder aggravaio, hx of, positive history of right shoulder discomfort in the summer. That require rotator cuff surgery. Has seen Dr. Romeo AppleHarrison in the past.  Shoulder surg ery on the right   Patient states that he has right sided abdominal pain. This has been present for about 1 month now. Feels tight and stabbing , off and on for the past month, generally right lower quadrant. Sometimes worse after meals. Sometimes associated with loose stool.  Last colonoscopy just three and a half yrs ago, bowels regular  Does not change with activity certain foods worsen    Trying to watch sugar in his diet realizes that's a problem for him.  Notes hand stiffness. Worse in the morning. Concerned that he may have something more serious than regular arthritis. Right-hand work hurts more than left. See prior notes negative rheumatology workup in past   Review of Systems No headache no chest pain and back  pain complete ROS otherwise negative    Objective:   Physical Exam  Alert blood pressure excellent on repeat HEENT normal left shoulder positive impingement sign lungs clear heart rare rhythm distinct epigastric tenderness mild diffuse low abdominal discomfort right greater than left. No masses good bowel sounds ankles no edema hands no no obvious Heberden's nodes no point tenderness no obvious joint swelling      Assessment & Plan:  Impression 1 hypertension good control #2 hand pain with concern regarding serious form of arthritis #3 probable IBS last colonoscopy several years go discussed #4 potential gastritis does take Celebrex regular #5 fibromyalgia and chronic pain with patient resistance go back to pain specialist for obvious reasons #6 through glucose intolerance status uncertain discuss plan flu shot. Hand x-ray appropriate blood work. Diet exercise discussed. 40 minutes spent most in discussion. We'll resume pain medication for now. #37.5 hydrocodone tablets one half twice a day when necessary 3 months with recheck in 3 months warning signs discussed WSL

## 2015-04-30 LAB — RHEUMATOID FACTOR: Rhuematoid fact SerPl-aCnc: <10 IU/mL (ref 0.0–13.9)

## 2015-04-30 LAB — HEMOGLOBIN A1C
Est. average glucose Bld gHb Est-mCnc: 114 mg/dL
Hgb A1c MFr Bld: 5.6 % (ref 4.8–5.6)

## 2015-04-30 LAB — SEDIMENTATION RATE: Sed Rate: 2 mm/hr (ref 0–30)

## 2015-05-08 ENCOUNTER — Ambulatory Visit (INDEPENDENT_AMBULATORY_CARE_PROVIDER_SITE_OTHER): Payer: 59 | Admitting: Orthopedic Surgery

## 2015-05-08 VITALS — BP 112/59 | Ht 71.0 in | Wt 221.0 lb

## 2015-05-08 DIAGNOSIS — M7542 Impingement syndrome of left shoulder: Secondary | ICD-10-CM | POA: Diagnosis not present

## 2015-05-08 DIAGNOSIS — M75102 Unspecified rotator cuff tear or rupture of left shoulder, not specified as traumatic: Secondary | ICD-10-CM | POA: Diagnosis not present

## 2015-05-08 NOTE — Progress Notes (Signed)
Vernon KetoRobert L Fisher is a 59 y.o. male  Shoulder Pain: This patient presents with a chief complaint left shoulder pain  Pain present for 8 months associated with pain swelling catching stiffness giving way. Complains of 8 out of 10 pain pain at night which is constant associated which sharp pain at times but primarily dull throbbing aching and he has pain when he moves his arm above his head. His only treatment to this point his been hydrocodone.  System review night sweats and fatigue frequent urination excessive nighttime urination depression anxiety shortness of breath cough numbness tingling burning pain in his legs from chronic back pain    Past Medical History  Diagnosis Date  . GERD (gastroesophageal reflux disease)   . Hypertension   . Diabetes mellitus without complication (HCC)   . Migraine headache   . Degenerative disc disease   . Chronic pain   . Fibromyalgia   . Reflux   . Plantar fasciitis   . PONV (postoperative nausea and vomiting)   . Depression    Past Surgical History  Procedure Laterality Date  . Bilateral carpal tunnel release    . Right rotator cuff repair    . Nose surgery    . Colonoscopy  06/08/2011    Procedure: COLONOSCOPY;  Surgeon: Arlyce HarmanSandi M Fields, MD;  [facility];  Service: Endoscopy;  Laterality: N/A;  11:15 AM  . Knee arthroscopy with medial menisectomy Left 09/06/2014    Procedure: KNEE ARTHROSCOPY WITH LIMITED DEBRIDEMENT;  Surgeon: Vickki HearingStanley E Harrison, MD;  Location: [facility];  Service: Orthopedics;  Laterality: Left;    Current outpatient prescriptions:  .  celecoxib (CELEBREX) 100 MG capsule, Take 1 capsule (100 mg total) by mouth daily., Disp: 30 capsule, Rfl: 5 .  enalapril (VASOTEC) 10 MG tablet, TAKE ONE TABLET BY MOUTH ONCE DAILY, Disp: 30 tablet, Rfl: 5 .  HYDROcodone-acetaminophen (NORCO) 7.5-325 MG tablet, Take 1/2 tablet BID PRN pain, Disp: 30 tablet, Rfl: 0 .  hyoscyamine (LEVBID) 0.375 MG 12 hr tablet, Take 1 tablet (0.375 mg  total) by mouth 2 (two) times daily., Disp: 60 tablet, Rfl: 5 .  pantoprazole (PROTONIX) 40 MG tablet, TAKE ONE TABLET BY MOUTH ONCE DAILY, Disp: 30 tablet, Rfl: 5 .  sucralfate (CARAFATE) 1 G tablet, Take 1 tablet AC and HS for 2 weeks, Disp: 60 tablet, Rfl: 0 Allergies  Allergen Reactions  . Latex Rash    Reaction : mouth sores    reports that he quit smoking about 26 years ago. His smoking use included Cigarettes. He has a 20 pack-year smoking history. He does not have any smokeless tobacco history on file. He reports that he does not drink alcohol or use illicit drugs. Family History  Problem Relation Age of Onset  . Colon cancer Neg Hx   . Hypertension Brother     Shoulder: Right Inspection reveals no abnormalities, atrophy or asymmetry. Palpation is normal with no tenderness over AC joint or bicipital groove. ROM is full in all planes. Rotator cuff strength normal throughout. No signs of impingement with negative Neer and Hawkin's tests, empty can. Speeds and Yergason's tests normal. No labral pathology noted with negative Obrien's, negative clunk and good stability. Normal scapular function observed. No painful arc and no drop arm sign. No apprehension sign.  Left shoulder on inspection tenderness around the peri-acromial region Before meals joint is normal bicipital groove is normal Flexion and the scapular plane active motion 120 decreased internal rotation rotator cuff strength normal positive  impingement especially with the Hawkins maneuver negative clunk good stability no labral pathology scapular function slightly off drop arm negative apprehension negative  X-rays done at the hospital I interpreted these as normal films  Recommend injection and physical therapy follow-up when necessary   Procedure note the subacromial injection shoulder left   Verbal consent was obtained to inject the  Left   Shoulder  Timeout was completed to confirm the injection site is a  subacromial space of the  left  shoulder  Medication used Depo-Medrol 40 mg and lidocaine 1% 3 cc  Anesthesia was provided by ethyl chloride  The injection was performed in the left  posterior subacromial space. After pinning the skin with alcohol and anesthetized the skin with ethyl chloride the subacromial space was injected using a 20-gauge needle. There were no complications  Sterile dressing was applied.

## 2015-05-08 NOTE — Patient Instructions (Signed)
Call [facility] therapy dept to schedule visits

## 2015-05-21 ENCOUNTER — Ambulatory Visit (HOSPITAL_COMMUNITY): Payer: 59 | Attending: Orthopedic Surgery

## 2015-05-21 ENCOUNTER — Encounter (HOSPITAL_COMMUNITY): Payer: Self-pay

## 2015-05-21 DIAGNOSIS — M75102 Unspecified rotator cuff tear or rupture of left shoulder, not specified as traumatic: Secondary | ICD-10-CM | POA: Diagnosis present

## 2015-05-21 DIAGNOSIS — M25612 Stiffness of left shoulder, not elsewhere classified: Secondary | ICD-10-CM

## 2015-05-21 DIAGNOSIS — M25512 Pain in left shoulder: Secondary | ICD-10-CM

## 2015-05-21 NOTE — Patient Instructions (Signed)
  Flexibility: Corner Stretch   Standing in corner with hands just above shoulder level and 2-3 feet from corner, lean forward until a comfortable stretch is felt across chest. Hold _10___ seconds. Repeat _3___ times per set. Do ____ sets per session. Do __2__ sessions per day.  http://orth.exer.us/342   Copyright  VHI. All rights reserved.   Scapular Retraction (Standing)   With arms at sides, pinch shoulder blades together. Repeat _10___ times per set. Do _1___ sets per session. Do __2__ sessions per day.  http://orth.exer.us/944   Copyright  VHI. All rights reserved.   ROM: Towel Stretch - with Interior Rotation   Pull left arm up behind back by pulling towel up with other arm. Hold _10___ seconds. Repeat __3__ times per set. Do __1__ sets per session. Do _2___ sessions per day.  http://orth.exer.us/888   Copyright  VHI. All rights reserved.   Posterior Capsule Stretch   Stand or sit, one arm across body so hand rests over opposite shoulder. Gently push on crossed elbow with other hand until stretch is felt in shoulder of crossed arm. Hold _10__ seconds.  Repeat _3__ times per session. Do _1__ sessions per day.  Copyright  VHI. All rights reserved.   Flexors Stretch, Standing   Stand near wall and slide arm up, with palm facing   wall, by leaning toward wall. Hold _10__ seconds.  Repeat __3_ times per session. Do _2__ sessions per day.  Copyright  VHI. All rights reserved.   (Home) Extension: Isometric / Bilateral Arm Retraction - Sitting   Facing anchor, hold hands and elbow at shoulder height, with elbow bent.  Pull arms back to squeeze shoulder blades together. Repeat 10-15 times.  Copyright  VHI. All rights reserved.   (Home) Retraction: Row - Bilateral (Anchor)   Facing anchor, arms reaching forward, pull hands toward stomach, keeping elbows bent and at your sides and pinching shoulder blades together. Repeat 10-15 times.  Copyright  VHI. All  rights reserved.   (Clinic) Extension / Flexion (Assist)   Face anchor, pull arms back, keeping elbow straight, and squeze shoulder blades together. Repeat 10-15 times.   Copyright  VHI. All rights reserved.

## 2015-05-21 NOTE — Therapy (Signed)
[facility] [address] [place], [place], [address] Phone: [phone]   Fax:  [phone]  Occupational Therapy Evaluation  Patient Details  Name: Vernon Fisher MRN: [id number] Date of Birth: Feb 27, 1956 Referring Provider: Romeo Apple  Encounter Date: 05/21/2015      OT End of Session - 05/21/15 1220    Visit Number 1   Number of Visits 8   Date for OT Re-Evaluation 07/20/15  mini reassess: 06/18/15   Authorization Type UHC   Authorization Time Period 20 visit limit - 0 used this year.   Authorization - Visit Number 1   Authorization - Number of Visits 20   OT Start Time [phone]   OT Stop Time 0930   OT Time Calculation (min) 40 min   Activity Tolerance Patient tolerated treatment well   Behavior During Therapy [facility] for tasks assessed/performed      Past Medical History  Diagnosis Date  . GERD (gastroesophageal reflux disease)   . Hypertension   . Diabetes mellitus without complication (HCC)   . Migraine headache   . Degenerative disc disease   . Chronic pain   . Fibromyalgia   . Reflux   . Plantar fasciitis   . PONV (postoperative nausea and vomiting)   . Depression     Past Surgical History  Procedure Laterality Date  . Bilateral carpal tunnel release    . Right rotator cuff repair    . Nose surgery    . Colonoscopy  06/08/2011    Procedure: COLONOSCOPY;  Surgeon: [name], MD;  [facility];  Service: Endoscopy;  Laterality: N/A;  11:15 AM  . Knee arthroscopy with medial menisectomy Left 09/06/2014    Procedure: KNEE ARTHROSCOPY WITH LIMITED DEBRIDEMENT;  Surgeon: Vickki Hearing, MD;  Location: [facility]S;  Service: Orthopedics;  Laterality: Left;    There were no vitals filed for this visit.  Visit Diagnosis:  Rotator cuff syndrome of left shoulder - Plan: Ot plan of care cert/re-cert  Shoulder pain, left - Plan: Ot plan of care cert/re-cert  Shoulder stiffness, left - Plan: Ot plan of care  cert/re-cert      Subjective Assessment - 05/21/15 0900    Subjective  S: It started to bother me at the beginning of the year.    Pertinent History Patient is a 59 y/o male S/P left rotator cuff syndrome with no known injury which began at the beginning of this year. pt reports that his job he was working required a lot of repetitive movements, lifting and overhead activities. Patient received a cortisone injection recently which helped decrease his pain from a 10/10 to a 5/10. Dr. Romeo Apple has referred patient to occupational therapy for evaluation and treatment.    Special Tests FOTO score: 77/100   Patient Stated Goals To decrease pain level in RUE.    Currently in Pain? Yes   Pain Score 5    Pain Location Shoulder   Pain Orientation Left   Pain Descriptors / Indicators Aching   Pain Type Chronic pain           [facility] OT Assessment - 05/21/15 0900    Assessment   Diagnosis left rotator cuff syndrome   Referring Provider Romeo Apple   Onset Date --  at beginning of year (2016)   Prior Therapy None   Precautions   Precautions None   Restrictions   Weight Bearing Restrictions No   Prior Function   Level of Independence Independent  Vocation Full time employment  On leave of absence right now due to wife's health   Mobility   Mobility Status Independent   Written Expression   Dominant Hand Right   Vision - History   Baseline Vision Wears glasses all the time   Cognition   Overall Cognitive Status Within Functional Limits for tasks assessed   ROM / Strength   AROM / PROM / Strength AROM;PROM;Strength   AROM   Overall AROM Comments Assessed seated. IR/er abducted.    AROM Assessment Site Shoulder   Right/Left Shoulder Left   Left Shoulder Flexion 130 Degrees   Left Shoulder ABduction 150 Degrees   Left Shoulder Internal Rotation 80 Degrees   Left Shoulder External Rotation 90 Degrees   PROM   PROM Assessment Site Shoulder   Right/Left Shoulder Left   Strength    Overall Strength Comments Assessed seated. IR/er adducted/   Strength Assessment Site Shoulder   Right/Left Shoulder Left   Left Shoulder Flexion 5/5   Left Shoulder ABduction 5/5   Left Shoulder Internal Rotation 4+/5   Left Shoulder External Rotation 4/5                         OT Education - 05/21/15 1141    Education provided Yes   Education Details shoulder stretches and theraband scapular exercises   Person(s) Educated Patient   Methods Explanation;Demonstration;Handout   Comprehension Returned demonstration;Verbalized understanding          OT Short Term Goals - 05/21/15 1226    OT SHORT TERM GOAL #1   Title Patient will be educated and independent with HEP to increase functional use of LUE during daily tasks.    Time 4   Period Weeks   Status New   OT SHORT TERM GOAL #2   Title Patient will decrease pain level in LUE to 2/10 or less during daily tasks.    Time 4   Period Weeks   Status New   OT SHORT TERM GOAL #3   Title Patient will increase A/ROM of LUE to WNL to increase ability to complete activities above head level.    Time 4   Period Weeks   Status New   OT SHORT TERM GOAL #4   Title Patient will increase strength in LUE shoulder to 5/5 to increase ability to complete activities out away from body.    Time 4   Period Weeks   Status New   OT SHORT TERM GOAL #5   Title Patient will decrease fascial restrictions to min amount to increase functional mobility and decrease pain level in LUE.                  Plan - 05/21/15 1222    Clinical Impression Statement A:Patient is a 59 y/o male S/P left rotator cuff sundrome causing increased pain and fascial restrictions and decreased strength and ROM resulting in difficulty completeing daily tasks.    Pt will benefit from skilled therapeutic intervention in order to improve on the following deficits (Retired) Decreased strength;Pain;Decreased range of motion;Increased fascial  restricitons   Rehab Potential Excellent   OT Frequency 2x / week   OT Duration 4 weeks   OT Treatment/Interventions Ultrasound;Self-care/ADL training;Iontophoresis;Passive range of motion;Patient/family education;Cryotherapy;Electrical Stimulation;Moist Heat;Therapeutic activities;Therapeutic exercise;Manual Therapy   Plan P: Patient requires skilled OT services to increase functional performance during daily tasks using LUE. Treatment Plan: myofascial release, passive stretching, A/ROM, general strengthening, scapular strengthening.  Consulted and Agree with Plan of Care Patient        Problem List Patient Active Problem List   Diagnosis Date Noted  . Other synovitis and tenosynovitis, unspecified lower leg   . Chronic pain syndrome 06/24/2014  . Abnormal fasting glucose 02/11/2014  . Fibromyalgia 09/29/2013  . Other malaise and fatigue 01/09/2013  . Chest pain 07/17/2012  . Essential hypertension 07/17/2012  . Arthritis 07/17/2012    [name], OTR/L,CBIS  [phone]  05/21/2015, 12:37 PM  [facility] [address] [place], [place], [address] Phone: [phone]   Fax:  [phone]  Name: Vernon Fisher MRN: [id number] Date of Birth: 1956-03-07

## 2015-05-23 ENCOUNTER — Ambulatory Visit (HOSPITAL_COMMUNITY): Payer: 59 | Admitting: Occupational Therapy

## 2015-05-23 ENCOUNTER — Encounter (HOSPITAL_COMMUNITY): Payer: Self-pay | Admitting: Occupational Therapy

## 2015-05-23 DIAGNOSIS — M25512 Pain in left shoulder: Secondary | ICD-10-CM

## 2015-05-23 DIAGNOSIS — M75102 Unspecified rotator cuff tear or rupture of left shoulder, not specified as traumatic: Secondary | ICD-10-CM | POA: Diagnosis not present

## 2015-05-23 DIAGNOSIS — M25612 Stiffness of left shoulder, not elsewhere classified: Secondary | ICD-10-CM

## 2015-05-23 NOTE — Therapy (Signed)
[facility] [address] [place], [place], [address] Phone: [phone]   Fax:  [phone]  Occupational Therapy Treatment  Patient Details  Name: Vernon Fisher MRN: [id number] Date of Birth: [birth year] Referring Provider: Romeo AppleHarrison  Encounter Date: 05/23/2015      OT End of Session - 05/23/15 1155    Visit Number 2   Number of Visits 8   Date for OT Re-Evaluation 07/20/15  mini reassess: 06/18/15   Authorization Type UHC   Authorization Time Period 20 visit limit - 0 used this year.   Authorization - Visit Number 2   Authorization - Number of Visits 20   OT Start Time 1016   OT Stop Time 1058   OT Time Calculation (min) 42 min   Activity Tolerance Patient tolerated treatment well   Behavior During Therapy WFL for tasks assessed/performed      Past Medical History  Diagnosis Date  . GERD (gastroesophageal reflux disease)   . Hypertension   . Diabetes mellitus without complication (HCC)   . Migraine headache   . Degenerative disc disease   . Chronic pain   . Fibromyalgia   . Reflux   . Plantar fasciitis   . PONV (postoperative nausea and vomiting)   . Depression     Past Surgical History  Procedure Laterality Date  . Bilateral carpal tunnel release    . Right rotator cuff repair    . Nose surgery    . Colonoscopy  06/08/2011    Procedure: COLONOSCOPY;  Surgeon: Arlyce HarmanSandi M Fields, MD;  [facility];  Service: Endoscopy;  Laterality: N/A;  11:15 AM  . Knee arthroscopy with medial menisectomy Left 09/06/2014    Procedure: KNEE ARTHROSCOPY WITH LIMITED DEBRIDEMENT;  Surgeon: Vickki HearingStanley E Harrison, MD;  Location: [facility];  Service: Orthopedics;  Laterality: Left;    There were no vitals filed for this visit.  Visit Diagnosis:  Shoulder pain, left  Shoulder stiffness, left      Subjective Assessment - 05/23/15 1020    Subjective  S: I haven't done anything yet today so it's not hurting yet.    Currently in  Pain? Yes   Pain Score 3    Pain Location Shoulder   Pain Orientation Left   Pain Descriptors / Indicators Aching   Pain Type Chronic pain            [facility] OT Assessment - 05/23/15 1042    Assessment   Diagnosis left rotator cuff syndrome   Precautions   Precautions None                  OT Treatments/Exercises (OP) - 05/23/15 1021    Exercises   Exercises Shoulder   Shoulder Exercises: Supine   Protraction PROM;AROM;10 reps   Horizontal ABduction PROM;AROM;10 reps   External Rotation PROM;AROM;10 reps   Internal Rotation PROM;AROM;10 reps   Flexion PROM;AROM;10 reps   ABduction PROM;AROM;10 reps   Shoulder Exercises: Standing   Protraction AROM;10 reps   Horizontal ABduction AROM;10 reps   External Rotation AROM;10 reps   Internal Rotation AROM;10 reps   Flexion AROM;10 reps   ABduction AROM;10 reps   Extension Theraband;10 reps   Theraband Level (Shoulder Extension) Level 2 (Red)   Row Theraband;10 reps   Theraband Level (Shoulder Row) Level 2 (Red)   Retraction Theraband;10 reps   Theraband Level (Shoulder Retraction) Level 2 (Red)   Shoulder Exercises: ROM/Strengthening   Proximal Shoulder Strengthening, Supine 10X  each no rest breaks   Proximal Shoulder Strengthening, Seated 10X each no rest break   Manual Therapy   Manual Therapy Myofascial release   Manual therapy comments manual therapy completed prior to exercises   Myofascial Release Myofascial release to left upper arm, deltoid, and trapezius regions to decrease pain and fascial restrictions and pain, and increase joint range of motion                  OT Short Term Goals - 05/23/15 1159    OT SHORT TERM GOAL #1   Title Patient will be educated and independent with HEP to increase functional use of LUE during daily tasks.    Time 4   Period Weeks   Status On-going   OT SHORT TERM GOAL #2   Title Patient will decrease pain level in LUE to 2/10 or less during daily tasks.     Time 4   Period Weeks   Status On-going   OT SHORT TERM GOAL #3   Title Patient will increase A/ROM of LUE to WNL to increase ability to complete activities above head level.    Time 4   Period Weeks   Status On-going   OT SHORT TERM GOAL #4   Title Patient will increase strength in LUE shoulder to 5/5 to increase ability to complete activities out away from body.    Time 4   Period Weeks   Status On-going   OT SHORT TERM GOAL #5   Title Patient will decrease fascial restrictions to min amount to increase functional mobility and decrease pain level in LUE.   Status On-going                  Plan - 05/23/15 1155    Clinical Impression Statement A: Initiated myofascial release, P/ROM, A/ROM, and scapular stability exercises this session. Pt demonstrates range of motion [facility] with complaints of minimal pain and discomfort during retraction with scapular theraband. Pt reports he did not attempt scapular theraband stretches at home with HEP.    Plan P: Increase A/ROM repetitions to 12, add x to v arms, follow up on HEP.         Problem List Patient Active Problem List   Diagnosis Date Noted  . Other synovitis and tenosynovitis, unspecified lower leg   . Chronic pain syndrome 06/24/2014  . Abnormal fasting glucose 02/11/2014  . Fibromyalgia 09/29/2013  . Other malaise and fatigue 01/09/2013  . Chest pain 07/17/2012  . Essential hypertension 07/17/2012  . Arthritis 07/17/2012    Vernon Fisher, Vernon Fisher  [phone]  05/23/2015, 11:59 AM  [facility] [address] [place], [place], [address] Phone: [phone]   Fax:  [phone]  Name: Vernon Fisher MRN: [id number] Date of Birth: 23-Feb-1956

## 2015-05-27 ENCOUNTER — Ambulatory Visit (HOSPITAL_COMMUNITY): Payer: 59

## 2015-05-27 ENCOUNTER — Encounter (HOSPITAL_COMMUNITY): Payer: Self-pay

## 2015-05-27 DIAGNOSIS — M75102 Unspecified rotator cuff tear or rupture of left shoulder, not specified as traumatic: Secondary | ICD-10-CM | POA: Diagnosis not present

## 2015-05-27 DIAGNOSIS — M25612 Stiffness of left shoulder, not elsewhere classified: Secondary | ICD-10-CM

## 2015-05-27 DIAGNOSIS — M25512 Pain in left shoulder: Secondary | ICD-10-CM

## 2015-05-27 NOTE — Therapy (Signed)
[facility] [address] [place], [place], [address] Phone: [phone]   Fax:  [phone]  Occupational Therapy Treatment  Patient Details  Name: Vernon Fisher MRN: [id number] Date of Birth: 1955/07/26 Referring Provider: Romeo Apple  Encounter Date: 05/27/2015      OT End of Session - 05/27/15 1209    Visit Number 3   Number of Visits 8   Date for OT Re-Evaluation 07/20/15  mini reassess: 06/18/15   Authorization Type UHC   Authorization Time Period 20 visit limit - 0 used this year.   Authorization - Visit Number 3   Authorization - Number of Visits 20   OT Start Time 0935   OT Stop Time 1015   OT Time Calculation (min) 40 min   Activity Tolerance Patient tolerated treatment well   Behavior During Therapy WFL for tasks assessed/performed      Past Medical History  Diagnosis Date  . GERD (gastroesophageal reflux disease)   . Hypertension   . Diabetes mellitus without complication (HCC)   . Migraine headache   . Degenerative disc disease   . Chronic pain   . Fibromyalgia   . Reflux   . Plantar fasciitis   . PONV (postoperative nausea and vomiting)   . Depression     Past Surgical History  Procedure Laterality Date  . Bilateral carpal tunnel release    . Right rotator cuff repair    . Nose surgery    . Colonoscopy  06/08/2011    Procedure: COLONOSCOPY;  Surgeon: [name], MD;  [facility];  Service: Endoscopy;  Laterality: N/A;  11:15 AM  . Knee arthroscopy with medial menisectomy Left 09/06/2014    Procedure: KNEE ARTHROSCOPY WITH LIMITED DEBRIDEMENT;  Surgeon: Vickki Hearing, MD;  Location: [facility];  Service: Orthopedics;  Laterality: Left;    There were no vitals filed for this visit.  Visit Diagnosis:  Shoulder pain, left  Shoulder stiffness, left      Subjective Assessment - 05/27/15 0955    Subjective  S: I do what I need to do regardless if it hurts.   Currently in Pain? Yes    Pain Score 4    Pain Location Shoulder   Pain Orientation Left   Pain Descriptors / Indicators Aching   Pain Type Chronic pain            [facility] OT Assessment - 05/27/15 0957    Assessment   Diagnosis left rotator cuff syndrome   Precautions   Precautions None                  OT Treatments/Exercises (OP) - 05/27/15 0955    Exercises   Exercises Shoulder   Shoulder Exercises: Supine   Protraction PROM;5 reps;AROM;12 reps   Horizontal ABduction PROM;5 reps;AROM;12 reps   External Rotation PROM;5 reps;AROM;12 reps   Internal Rotation PROM;5 reps;AROM;12 reps   Flexion PROM;5 reps;AROM;12 reps   ABduction PROM;5 reps;AROM;12 reps   Shoulder Exercises: Standing   Horizontal ABduction Theraband;10 reps   Theraband Level (Shoulder Horizontal ABduction) Level 2 (Red)   Extension Theraband;10 reps   Theraband Level (Shoulder Extension) Level 2 (Red)   Shoulder Exercises: ROM/Strengthening   X to V Arms 12X   Proximal Shoulder Strengthening, Supine 12X each no rest breaks   Manual Therapy   Manual Therapy Myofascial release;Muscle Energy Technique   Manual therapy comments manual therapy completed prior to exercises   Myofascial Release Myofascial release  to left upper arm, deltoid, and trapezius regions to decrease pain and fascial restrictions and pain, and increase joint range of motion   Muscle Energy Technique Muscle energy technique complete to left anterior deltoid to decrease muscle spasm and increase joint mobility and ROM.                 OT Education - 05/27/15 1212    Education provided Yes   Education Details self myofascial release with ball   Person(s) Educated Patient   Methods Explanation;Demonstration   Comprehension Returned demonstration;Verbalized understanding          OT Short Term Goals - 05/23/15 1159    OT SHORT TERM GOAL #1   Title Patient will be educated and independent with HEP to increase functional use of LUE during  daily tasks.    Time 4   Period Weeks   Status On-going   OT SHORT TERM GOAL #2   Title Patient will decrease pain level in LUE to 2/10 or less during daily tasks.    Time 4   Period Weeks   Status On-going   OT SHORT TERM GOAL #3   Title Patient will increase A/ROM of LUE to WNL to increase ability to complete activities above head level.    Time 4   Period Weeks   Status On-going   OT SHORT TERM GOAL #4   Title Patient will increase strength in LUE shoulder to 5/5 to increase ability to complete activities out away from body.    Time 4   Period Weeks   Status On-going   OT SHORT TERM GOAL #5   Title Patient will decrease fascial restrictions to min amount to increase functional mobility and decrease pain level in LUE.   Status On-going                  Plan - 05/27/15 1210    Clinical Impression Statement A: Increased A/ROM repeitions to 12 supine, added X to V arms, and added shoulder stability theraband exercises. patient reports pain in left side during supine shoulder flexion and pain in left anterior deltoid during abudction and internal and external rotation.    Plan P: Add 1# weight to supine exercises. Add hughston and scapuler raises prone.         Problem List Patient Active Problem List   Diagnosis Date Noted  . Other synovitis and tenosynovitis, unspecified lower leg   . Chronic pain syndrome 06/24/2014  . Abnormal fasting glucose 02/11/2014  . Fibromyalgia 09/29/2013  . Other malaise and fatigue 01/09/2013  . Chest pain 07/17/2012  . Essential hypertension 07/17/2012  . Arthritis 07/17/2012    Limmie PatriciaLaura Kassaundra Hair, OTR/L,CBIS  [phone]  05/27/2015, 12:13 PM  [facility] [address] [place], [place], [address] Phone: [phone]   Fax:  [phone]  Name: Vernon KetoRobert L Fisher MRN: [id number] Date of Birth: Nov 15, 1955

## 2015-06-03 ENCOUNTER — Encounter (HOSPITAL_COMMUNITY): Payer: Self-pay

## 2015-06-03 ENCOUNTER — Ambulatory Visit (HOSPITAL_COMMUNITY): Payer: 59

## 2015-06-03 DIAGNOSIS — M75102 Unspecified rotator cuff tear or rupture of left shoulder, not specified as traumatic: Secondary | ICD-10-CM | POA: Diagnosis not present

## 2015-06-03 DIAGNOSIS — M25612 Stiffness of left shoulder, not elsewhere classified: Secondary | ICD-10-CM

## 2015-06-03 DIAGNOSIS — M25512 Pain in left shoulder: Secondary | ICD-10-CM

## 2015-06-03 NOTE — Therapy (Signed)
[facility] [address] , [place], [address] Phone: [phone]   Fax:  [phone]  Occupational Therapy Treatment  Patient Details  Name: Vernon KetoRobert L Fisher MRN: [id number] Date of Birth: August 20, 1955 Referring Provider: Romeo AppleHarrison  Encounter Date: 06/03/2015      OT End of Session - 06/03/15 0913    Visit Number 4   Number of Visits 8   Date for OT Re-Evaluation 07/20/15  mini reassess: 06/18/15   Authorization Type UHC   Authorization Time Period 20 visit limit - 0 used this year.   Authorization - Visit Number 4   Authorization - Number of Visits 20   OT Start Time [phone]   OT Stop Time 0930   OT Time Calculation (min) 40 min   Activity Tolerance Patient tolerated treatment well   Behavior During [facility] for tasks assessed/performed      Past Medical History  Diagnosis Date  . GERD (gastroesophageal reflux disease)   . Hypertension   . Diabetes mellitus without complication (HCC)   . Migraine headache   . Degenerative disc disease   . Chronic pain   . Fibromyalgia   . Reflux   . Plantar fasciitis   . PONV (postoperative nausea and vomiting)   . Depression     Past Surgical History  Procedure Laterality Date  . Bilateral carpal tunnel release    . Right rotator cuff repair    . Nose surgery    . Colonoscopy  06/08/2011    Procedure: COLONOSCOPY;  Surgeon: Arlyce HarmanSandi M Fields, MD;  [facility];  Service: Endoscopy;  Laterality: N/A;  11:15 AM  . Knee arthroscopy with medial menisectomy Left 09/06/2014    Procedure: KNEE ARTHROSCOPY WITH LIMITED DEBRIDEMENT;  Surgeon: Vickki HearingStanley E Harrison, MD;  Location: [facility];  Service: Orthopedics;  Laterality: Left;    There were no vitals filed for this visit.  Visit Diagnosis:  Shoulder stiffness, left  Shoulder pain, left      Subjective Assessment - 06/03/15 0912    Subjective  S: It wasn't hurting me but then it started to after I was carrying around my  grandson.    Currently in Pain? Yes   Pain Score 2    Pain Location Shoulder   Pain Orientation Left   Pain Descriptors / Indicators Aching   Pain Type Acute pain            [facility] OT Assessment - 06/03/15 0914    Assessment   Diagnosis left rotator cuff syndrome   Precautions   Precautions None                  OT Treatments/Exercises (OP) - 06/03/15 0914    Exercises   Exercises Shoulder   Shoulder Exercises: Supine   Protraction PROM;5 reps;Strengthening;12 reps   Protraction Weight (lbs) 1   Horizontal ABduction PROM;5 reps;Strengthening;12 reps   Horizontal ABduction Weight (lbs) 1   External Rotation PROM;5 reps;Strengthening;12 reps   External Rotation Weight (lbs) 1   Internal Rotation PROM;5 reps;Strengthening;12 reps   Internal Rotation Weight (lbs) 1   Flexion PROM;5 reps;Strengthening;12 reps   Shoulder Flexion Weight (lbs) 1   ABduction PROM;5 reps;Strengthening;15 reps   Shoulder ABduction Weight (lbs) 1   Shoulder Exercises: Prone   Other Prone Exercises [place] exercises 10X with 1#   Shoulder Exercises: ROM/Strengthening   UBE (Upper Arm Bike) Level 1 3' reverse   Proximal Shoulder Strengthening, Supine 12X each 1# no  rest breaks   Manual Therapy   Manual Therapy Myofascial release   Manual therapy comments manual therapy completed prior to exercises   Myofascial Release Myofascial release to left upper arm, deltoid, and trapezius regions to decrease pain and fascial restrictions and pain, and increase joint range of motion                  OT Short Term Goals - 05/23/15 1159    OT SHORT TERM GOAL #1   Title Patient will be educated and independent with HEP to increase functional use of LUE during daily tasks.    Time 4   Period Weeks   Status On-going   OT SHORT TERM GOAL #2   Title Patient will decrease pain level in LUE to 2/10 or less during daily tasks.    Time 4   Period Weeks   Status On-going   OT SHORT TERM GOAL  #3   Title Patient will increase A/ROM of LUE to WNL to increase ability to complete activities above head level.    Time 4   Period Weeks   Status On-going   OT SHORT TERM GOAL #4   Title Patient will increase strength in LUE shoulder to 5/5 to increase ability to complete activities out away from body.    Time 4   Period Weeks   Status On-going   OT SHORT TERM GOAL #5   Title Patient will decrease fascial restrictions to min amount to increase functional mobility and decrease pain level in LUE.   Status On-going                  Plan - 06/03/15 9604    Clinical Impression Statement A: Progressed to 1# hand weight supine and prone. Added UBE bike in reverse to focus on scapular retraction. Pt required min VC for form and technique. Pt reports that weights during exercises did not cause any increased pain.   Plan P: Progress to 2# handweights supine and standing. Add sidelying strengthening exercises as well.         Problem List Patient Active Problem List   Diagnosis Date Noted  . Other synovitis and tenosynovitis, unspecified lower leg   . Chronic pain syndrome 06/24/2014  . Abnormal fasting glucose 02/11/2014  . Fibromyalgia 09/29/2013  . Other malaise and fatigue 01/09/2013  . Chest pain 07/17/2012  . Essential hypertension 07/17/2012  . Arthritis 07/17/2012    [name], OTR/L,CBIS  [phone]  06/03/2015, 9:30 AM  [facility] [address] [place], [place], [address] Phone: [phone]   Fax:  [phone]  Name: Vernon Fisher MRN: [id number] Date of Birth: 12/05/1955

## 2015-06-05 ENCOUNTER — Ambulatory Visit (HOSPITAL_COMMUNITY): Payer: 59 | Attending: Orthopedic Surgery

## 2015-06-05 ENCOUNTER — Encounter (HOSPITAL_COMMUNITY): Payer: Self-pay

## 2015-06-05 DIAGNOSIS — M25512 Pain in left shoulder: Secondary | ICD-10-CM | POA: Diagnosis present

## 2015-06-05 DIAGNOSIS — M25612 Stiffness of left shoulder, not elsewhere classified: Secondary | ICD-10-CM

## 2015-06-05 NOTE — Therapy (Signed)
[facility] [address] [place], [place], [address] Phone: [phone]   Fax:  [phone]  Occupational Therapy Treatment  Patient Details  Name: Vernon Fisher MRN: [id number] Date of Birth: 06-Sep-1955 Referring Provider: Romeo AppleHarrison  Encounter Date: 06/05/2015      OT End of Session - 06/05/15 1118    Visit Number 5   Number of Visits 8   Date for OT Re-Evaluation 07/20/15  mini reassess: 06/18/15   Authorization Type UHC   Authorization Time Period 20 visit limit - 0 used this year.   Authorization - Visit Number 5   Authorization - Number of Visits 20   OT Start Time 0805   OT Stop Time 0846   OT Time Calculation (min) 41 min   Activity Tolerance Patient tolerated treatment well   Behavior During Therapy [facility] for tasks assessed/performed      Past Medical History  Diagnosis Date  . GERD (gastroesophageal reflux disease)   . Hypertension   . Diabetes mellitus without complication (HCC)   . Migraine headache   . Degenerative disc disease   . Chronic pain   . Fibromyalgia   . Reflux   . Plantar fasciitis   . PONV (postoperative nausea and vomiting)   . Depression     Past Surgical History  Procedure Laterality Date  . Bilateral carpal tunnel release    . Right rotator cuff repair    . Nose surgery    . Colonoscopy  06/08/2011    Procedure: COLONOSCOPY;  Surgeon: Arlyce HarmanSandi Fisher Fields, MD;  [facility];  Service: Endoscopy;  Laterality: N/A;  11:15 AM  . Knee arthroscopy with medial menisectomy Left 09/06/2014    Procedure: KNEE ARTHROSCOPY WITH LIMITED DEBRIDEMENT;  Surgeon: Vickki HearingStanley E Harrison, MD;  Location: [facility];  Service: Orthopedics;  Laterality: Left;    There were no vitals filed for this visit.  Visit Diagnosis:  Shoulder stiffness, left  Shoulder pain, left      Subjective Assessment - 06/05/15 0826    Subjective  S: My neck is hurting today and I've had a headache since yesterday.   Currently  in Pain? Yes   Pain Score 7    Pain Location Neck   Pain Orientation Left;Lateral   Pain Descriptors / Indicators Aching   Pain Type Acute pain            [facility] OT Assessment - 06/05/15 0831    Assessment   Diagnosis left rotator cuff syndrome   Precautions   Precautions None                  OT Treatments/Exercises (OP) - 06/05/15 0830    Exercises   Exercises Shoulder   Shoulder Exercises: Supine   Protraction PROM;5 reps;Strengthening;15 reps   Protraction Weight (lbs) 2   Horizontal ABduction PROM;5 reps;Strengthening;15 reps   Horizontal ABduction Weight (lbs) 2   External Rotation PROM;5 reps;Strengthening;15 reps   External Rotation Weight (lbs) 2   Internal Rotation PROM;5 reps;Strengthening;15 reps   Internal Rotation Weight (lbs) 2   Flexion PROM;5 reps;Strengthening;15 reps   Shoulder Flexion Weight (lbs) 2   ABduction PROM;5 reps;Strengthening;15 reps   Shoulder ABduction Weight (lbs) 2   Shoulder Exercises: Sidelying   External Rotation Strengthening;12 reps   External Rotation Weight (lbs) 2   Internal Rotation Strengthening;12 reps   Internal Rotation Weight (lbs) 2   Flexion Strengthening;12 reps   Flexion Weight (lbs) 2   ABduction  Strengthening;12 reps   ABduction Weight (lbs) 2   Shoulder Exercises: ROM/Strengthening   Proximal Shoulder Strengthening, Supine 15X each 2# no rest breaks   Manual Therapy   Manual Therapy Myofascial release   Manual therapy comments manual therapy completed prior to exercises   Myofascial Release Myofascial release to left upper arm, deltoid, and trapezius regions to decrease pain and fascial restrictions and pain, and increase joint range of motion                OT Education - 06/05/15 1118    Education provided Yes   Education Details cervical stretches   Person(s) Educated Patient   Methods Explanation;Demonstration;Handout;Verbal cues   Comprehension Returned demonstration;Verbalized  understanding          OT Short Term Goals - 05/23/15 1159    OT SHORT TERM GOAL #1   Title Patient will be educated and independent with HEP to increase functional use of LUE during daily tasks.    Time 4   Period Weeks   Status On-going   OT SHORT TERM GOAL #2   Title Patient will decrease pain level in LUE to 2/10 or less during daily tasks.    Time 4   Period Weeks   Status On-going   OT SHORT TERM GOAL #3   Title Patient will increase A/ROM of LUE to WNL to increase ability to complete activities above head level.    Time 4   Period Weeks   Status On-going   OT SHORT TERM GOAL #4   Title Patient will increase strength in LUE shoulder to 5/5 to increase ability to complete activities out away from body.    Time 4   Period Weeks   Status On-going   OT SHORT TERM GOAL #5   Title Patient will decrease fascial restrictions to min amount to increase functional mobility and decrease pain level in LUE.   Status On-going                  Plan - 06/05/15 1119    Clinical Impression Statement A: Pt reports increased pain on left lateral side of neck with a headache that began yesterday. Palpated large trigger point in upper trapezius and patient reports radiating pain with massage in that region. Added cervical stretches during tx session. patient required VC for form and technique. Pt reports a continued pain level 7/10 in neck region.    Plan P: Add cybex row and press.         Problem List Patient Active Problem List   Diagnosis Date Noted  . Other synovitis and tenosynovitis, unspecified lower leg   . Chronic pain syndrome 06/24/2014  . Abnormal fasting glucose 02/11/2014  . Fibromyalgia 09/29/2013  . Other malaise and fatigue 01/09/2013  . Chest pain 07/17/2012  . Essential hypertension 07/17/2012  . Arthritis 07/17/2012    Vernon Fisher, OTR/L,CBIS  [phone]  06/05/2015, 11:25 AM  [facility] [address] [place], [place], [address] Phone: [phone]   Fax:  [phone]  Name: Vernon CAINE MRN: [id number] Date of Birth: Jul 07, 1955

## 2015-06-05 NOTE — Patient Instructions (Signed)
Flexibility: Upper Trapezius Stretch    Gently grasp right side of head while reaching behind back with other hand. Tilt head away until a gentle stretch is felt. Hold __10__ seconds. Repeat __3__ times per set. Do ____ sets per session. Do ____ sessions per day.  http://orth.exer.us/340   Copyright  VHI. All rights reserved.  Flexibility: Neck Stretch    Grasp left arm above wrist and pull down across body while gently tilting head same direction. Hold _10___ seconds. Relax. Repeat __3__ times per set. Do ____ sets per session. Do ____ sessions per day.  http://orth.exer.us/346   Copyright  VHI. All rights reserved.  Lower Cervical / Upper Thoracic Stretch    Clasp hands together in front with arms extended. Gently pull shoulder blades apart and bend head forward. Hold _10___ seconds. Repeat ___3_ times per set. Do ____ sets per session. Do ____ sessions per day.  http://orth.exer.us/354   Copyright  VHI. All rights reserved.

## 2015-06-10 ENCOUNTER — Telehealth (HOSPITAL_COMMUNITY): Payer: Self-pay

## 2015-06-10 ENCOUNTER — Ambulatory Visit (HOSPITAL_COMMUNITY): Payer: 59

## 2015-06-10 NOTE — Telephone Encounter (Signed)
DATE: 06/10/15  Attempted to contact patient regarding his missed appt today 06/10/15 at 8:45AM although number on file was incorrect per patron that answered phone and patient was not contacted.  Limmie PatriciaLaura Destaney Sarkis, OTR/L,CBIS  [phone]

## 2015-06-12 ENCOUNTER — Ambulatory Visit (HOSPITAL_COMMUNITY): Payer: 59

## 2015-06-12 ENCOUNTER — Encounter (HOSPITAL_COMMUNITY): Payer: Self-pay

## 2015-06-12 DIAGNOSIS — M25512 Pain in left shoulder: Secondary | ICD-10-CM

## 2015-06-12 DIAGNOSIS — M25612 Stiffness of left shoulder, not elsewhere classified: Secondary | ICD-10-CM

## 2015-06-12 NOTE — Therapy (Signed)
Vernon Fisher [address] [place], [place], [address] Phone: [phone]   Fax:  [phone]  Occupational Therapy Treatment  Patient Details  Name: Vernon Fisher MRN: [id number] Date of Birth: 1955/08/12 Referring Provider: Romeo AppleHarrison  Encounter Date: 06/12/2015      OT End of Session - 06/12/15 1057    Visit Number 66   Number of Visits 8   Date for OT Re-Evaluation 07/20/15  mini reassess: 06/18/15   Authorization Type UHC   Authorization Time Period 20 visit limit - 0 used this year.   Authorization - Visit Number 6   Authorization - Number of Visits 20   OT Start Time [phone]   OT Stop Time 0930   OT Time Calculation (min) 40 min   Activity Tolerance Patient tolerated treatment well   Behavior During Therapy [facility] for tasks assessed/performed      Past Medical History  Diagnosis Date  . GERD (gastroesophageal reflux disease)   . Hypertension   . Diabetes mellitus without complication (HCC)   . Migraine headache   . Degenerative disc disease   . Chronic pain   . Fibromyalgia   . Reflux   . Plantar fasciitis   . PONV (postoperative nausea and vomiting)   . Depression     Past Surgical History  Procedure Laterality Date  . Bilateral carpal tunnel release    . Right rotator cuff repair    . Nose surgery    . Colonoscopy  06/08/2011    Procedure: COLONOSCOPY;  Surgeon: Arlyce HarmanSandi M Fields, MD;  [facility];  Service: Endoscopy;  Laterality: N/A;  11:15 AM  . Knee arthroscopy with medial menisectomy Left 09/06/2014    Procedure: KNEE ARTHROSCOPY WITH LIMITED DEBRIDEMENT;  Surgeon: Vickki HearingStanley E Harrison, MD;  Location: [facility];  Service: Orthopedics;  Laterality: Left;    There were no vitals filed for this visit.  Visit Diagnosis:  Shoulder stiffness, left  Shoulder pain, left      Subjective Assessment - 06/12/15 0922    Subjective  S: I over slept last time so I missed my appointment.   Currently in Pain? Yes   Pain Score 5    Pain Location Neck  shoulder left   Pain Orientation Posterior   Pain Descriptors / Indicators Aching   Pain Type Acute pain            [facility] OT Assessment - 06/12/15 [id number]    Assessment   Diagnosis left rotator cuff syndrome   Precautions   Precautions None                  OT Treatments/Exercises (OP) - 06/12/15 0923    Exercises   Exercises Shoulder   Shoulder Exercises: Supine   Protraction PROM;5 reps   Horizontal ABduction PROM;5 reps   External Rotation PROM;5 reps   Internal Rotation PROM;5 reps   Flexion PROM;5 reps;Strengthening;15 reps   Shoulder Flexion Weight (lbs) 2   ABduction PROM;5 reps;Strengthening;15 reps   Shoulder Exercises: Stretch   Corner Stretch 3 reps;10 seconds   Wall Stretch - Flexion 3 reps  10 seconds   Manual Therapy   Manual Therapy Myofascial release   Manual therapy comments manual therapy completed prior to exercises   Myofascial Release Myofascial release to left upper arm, deltoid, and trapezius regions to decrease pain and fascial restrictions and pain, and increase joint range of motion  OT Short Term Goals - 05/23/15 1159    OT SHORT TERM GOAL #1   Title Patient will be educated and independent with HEP to increase functional use of LUE during daily tasks.    Time 4   Period Weeks   Status On-going   OT SHORT TERM GOAL #2   Title Patient will decrease pain level in LUE to 2/10 or less during daily tasks.    Time 4   Period Weeks   Status On-going   OT SHORT TERM GOAL #3   Title Patient will increase A/ROM of LUE to WNL to increase ability to complete activities above head level.    Time 4   Period Weeks   Status On-going   OT SHORT TERM GOAL #4   Title Patient will increase strength in LUE shoulder to 5/5 to increase ability to complete activities out away from body.    Time 4   Period Weeks   Status On-going   OT SHORT TERM GOAL #5   Title Patient will decrease  fascial restrictions to min amount to increase functional mobility and decrease pain level in LUE.   Status On-going                  Plan - 06/12/15 1058    Clinical Impression Statement A: Patient presented with max fascial restrictions and trigger points in left UE and cervical region. session focused primarily on manual therapy and cervical pain management.    Plan P: mini-Reassess.         Problem List Patient Active Problem List   Diagnosis Date Noted  . Other synovitis and tenosynovitis, unspecified lower leg   . Chronic pain syndrome 06/24/2014  . Abnormal fasting glucose 02/11/2014  . Fibromyalgia 09/29/2013  . Other malaise and fatigue 01/09/2013  . Chest pain 07/17/2012  . Essential hypertension 07/17/2012  . Arthritis 07/17/2012    [name], OTR/L,CBIS  [phone]  06/12/2015, 11:25 AM  [facility] [address] [place], [place], [address] Phone: [phone]   Fax:  [phone]  Name: Vernon Fisher MRN: [id number] Date of Birth: Jul 09, 1955

## 2015-06-17 ENCOUNTER — Ambulatory Visit (HOSPITAL_COMMUNITY): Payer: 59

## 2015-06-17 ENCOUNTER — Encounter (HOSPITAL_COMMUNITY): Payer: Self-pay

## 2015-06-17 DIAGNOSIS — M25612 Stiffness of left shoulder, not elsewhere classified: Secondary | ICD-10-CM

## 2015-06-17 DIAGNOSIS — M25512 Pain in left shoulder: Secondary | ICD-10-CM

## 2015-06-17 NOTE — Therapy (Signed)
[place], [place], [address] Phone: [phone]   Fax:  [phone]  Occupational Therapy Treatment and mini reassessment  Patient Details  Name: Vernon Fisher MRN: [id number] Date of Birth: [birth year] Referring Provider: Aline Fisher  Encounter Date: 06/17/2015      OT End of Session - 06/17/15 1203    Visit Number 7   Number of Visits 8   Date for OT Re-Evaluation 07/20/15   Authorization Type UHC   Authorization Time Period 20 visit limit - 0 used this year.   Authorization - Visit Number 7   Authorization - Number of Visits 20   OT Start Time [phone]  reassess   OT Stop Time 0930   OT Time Calculation (min) 40 min   Activity Tolerance Patient tolerated treatment well   Behavior During Therapy [name] for tasks assessed/performed      Past Medical History  Diagnosis Date  . GERD (gastroesophageal reflux disease)   . Hypertension   . Diabetes mellitus without complication ([place])   . Migraine headache   . Degenerative disc disease   . Chronic pain   . Fibromyalgia   . Reflux   . Plantar fasciitis   . PONV (postoperative nausea and vomiting)   . Depression     Past Surgical History  Procedure Laterality Date  . Bilateral carpal tunnel release    . Right rotator cuff repair    . Nose surgery    . Colonoscopy  06/08/2011    Procedure: COLONOSCOPY;  Surgeon: Dorothyann Peng, MD;  [facility];  Service: Endoscopy;  Laterality: N/A;  11:15 AM  . Knee arthroscopy with medial menisectomy Left 09/06/2014    Procedure: KNEE ARTHROSCOPY WITH LIMITED DEBRIDEMENT;  Surgeon: Carole Civil, MD;  Location: [facility];  Service: Orthopedics;  Laterality: Left;    There were no vitals filed for this visit.  Visit Diagnosis:  Shoulder stiffness, left  Shoulder pain, left      Subjective Assessment - 06/17/15 1005    Subjective  S: I took a few days and I didn't do anything and my shoulder felt great.     Special Tests FOTO score: 82/100   Currently in Pain? Yes   Pain Score 2    Pain Location Shoulder   Pain Orientation Left   Pain Descriptors / Indicators Aching   Pain Type Acute pain            [facility] OT Assessment - 06/17/15 0854    Assessment   Diagnosis left rotator cuff syndrome   Precautions   Precautions None   AROM   Overall AROM Comments Assessed seated. IR/er abducted.    AROM Assessment Site Shoulder   Right/Left Shoulder Left   Left Shoulder Flexion 155 Degrees  previous: 155   Left Shoulder ABduction 161 Degrees  previous: 150   Left Shoulder Internal Rotation 90 Degrees  previous: 80   Left Shoulder External Rotation 90 Degrees  previous: 90   PROM   Overall PROM  Within functional limits for tasks performed   PROM Assessment Site Shoulder   Right/Left Shoulder Left   Strength   Overall Strength Comments Assessed seated. IR/er adducted/   Strength Assessment Site Shoulder   Right/Left Shoulder Left   Left Shoulder Flexion 5/5  previous: 5/5   Left Shoulder ABduction 5/5  previous: 5/5   Left Shoulder Internal Rotation 5/5  previous: 4+/5   Left Shoulder External Rotation 5/5  previous: 4/5                  OT Treatments/Exercises (OP) - 06/17/15 1007    Exercises   Exercises Shoulder   Shoulder Exercises: Supine   Protraction PROM;5 reps   Horizontal ABduction PROM;5 reps   External Rotation PROM;5 reps   Internal Rotation PROM;5 reps   Flexion PROM;5 reps   ABduction PROM;5 reps   Manual Therapy   Manual Therapy Myofascial release   Manual therapy comments manual therapy completed prior to exercises   Myofascial Release Myofascial release to left upper arm, deltoid, and trapezius regions to decrease pain and fascial restrictions and pain, and increase joint range of motion                OT Education - 06/17/15 1007    Education provided Yes   Education Details Reviewed HEP and made recommendation           OT Short Term Goals - 06/17/15 0918    OT SHORT TERM GOAL #1   Title Patient will be educated and independent with HEP to increase functional use of LUE during daily tasks.    Time 4   Period Weeks   Status Achieved   OT SHORT TERM GOAL #2   Title Patient will decrease pain level in LUE to 2/10 or less during daily tasks.    Time 4   Period Weeks   Status Not Met   OT SHORT TERM GOAL #3   Title Patient will increase A/ROM of LUE to WNL to increase ability to complete activities above head level.    Time 4   Period Weeks   Status Achieved   OT SHORT TERM GOAL #4   Title Patient will increase strength in LUE shoulder to 5/5 to increase ability to complete activities out away from body.    Time 4   Period Weeks   Status Achieved   OT SHORT TERM GOAL #5   Title Patient will decrease fascial restrictions to min amount to increase functional mobility and decrease pain level in LUE.   Status Achieved                  Plan - 06/17/15 1204    Clinical Impression Statement A: Mini reassessment completed this date. patient has met 4/5 STGs. patient has been educated on an HEP and instructed on decreasing the intensity of physical activity he completes as to no aggrevate his Left shoulder. patient does report that when he does not work his arm as hard during household activities it does not hurt as bad.    Plan P: D/C with HEP.        Problem List Patient Active Problem List   Diagnosis Date Noted  . Other synovitis and tenosynovitis, unspecified lower leg   . Chronic pain syndrome 06/24/2014  . Abnormal fasting glucose 02/11/2014  . Fibromyalgia 09/29/2013  . Other malaise and fatigue 01/09/2013  . Chest pain 07/17/2012  . Essential hypertension 07/17/2012  . Arthritis 07/17/2012   OCCUPATIONAL THERAPY DISCHARGE SUMMARY  Visits from Start of Care: 7 Current functional level related to goals / functional outcomes: See above   Remaining deficits: Patient's pain  level increases/decreases depending on what activity he completes with his Left shoulder.    Education / Equipment: Cervical stretches, shoulder stretches, theraband scapular strengthening. Plan: Patient agrees to discharge.  Patient goals were met. Patient is being discharged due to meeting the stated rehab goals.  ?????  Ailene Ravel, OTR/L,CBIS  [phone]  06/17/2015, 12:08 PM  [place] [address] [place], [place], [address] Phone: [phone]   Fax:  [phone]  Name: Vernon Fisher MRN: [id number] Date of Birth: December 18, 1955

## 2015-06-25 ENCOUNTER — Other Ambulatory Visit: Payer: Self-pay | Admitting: Family Medicine

## 2015-07-21 ENCOUNTER — Ambulatory Visit (INDEPENDENT_AMBULATORY_CARE_PROVIDER_SITE_OTHER): Payer: BLUE CROSS/BLUE SHIELD | Admitting: Family Medicine

## 2015-07-21 ENCOUNTER — Encounter: Payer: Self-pay | Admitting: Family Medicine

## 2015-07-21 VITALS — BP 132/88 | Temp 99.5°F | Ht 71.0 in | Wt 219.0 lb

## 2015-07-21 DIAGNOSIS — J329 Chronic sinusitis, unspecified: Secondary | ICD-10-CM | POA: Diagnosis not present

## 2015-07-21 MED ORDER — AMOXICILLIN-POT CLAVULANATE 875-125 MG PO TABS: 1.0000 | ORAL_TABLET | Freq: Two times a day (BID) | ORAL | Status: DC

## 2015-07-21 NOTE — Progress Notes (Signed)
   Subjective:    Patient ID: Vernon Fisher, male    DOB: 15-Jan-1956, 60 y.o.   MRN: [id number]  Cough This is a new problem. Episode onset: 4 days ago. Associated symptoms include ear pain, headaches, nasal congestion and a sore throat. Associated symptoms comments: Diarrhea . He has tried nothing for the symptoms.   Requesting referral to Dr. Romeo AppleHarrison for left shoulder and arm pain.   Needs another referral  Frontal headache and cong   Using cough and cold meds and not helping much     Review of Systems  HENT: Positive for ear pain and sore throat.   Respiratory: Positive for cough.   Neurological: Positive for headaches.       Objective:   Physical Exam  alert vital stable. Hydration good. Moderate malaise. HEENT moderate nasal congestion frontal tenderness pharynx slight erythema neck supple lungs clear heart regular rate and rhythm.       Assessment & Plan:  Impression rhinosinusitis likely post viral, discussed with patient. plan antibiotics prescribed. Questions answered. Symptomatic care discussed. warning signs discussed. WSL  patient advised to call his orthopedist directly who is already following him for his left shoulder

## 2015-07-30 ENCOUNTER — Ambulatory Visit: Payer: 59 | Admitting: Family Medicine

## 2015-07-30 ENCOUNTER — Ambulatory Visit (INDEPENDENT_AMBULATORY_CARE_PROVIDER_SITE_OTHER): Payer: BLUE CROSS/BLUE SHIELD | Admitting: Family Medicine

## 2015-07-30 DIAGNOSIS — T7840XA Allergy, unspecified, initial encounter: Secondary | ICD-10-CM

## 2015-07-30 DIAGNOSIS — J329 Chronic sinusitis, unspecified: Secondary | ICD-10-CM

## 2015-07-30 MED ORDER — PREDNISONE 20 MG PO TABS: ORAL_TABLET | ORAL | Status: DC

## 2015-07-30 MED ORDER — DOXYCYCLINE HYCLATE 100 MG PO CAPS: 100.0000 mg | ORAL_CAPSULE | Freq: Two times a day (BID) | ORAL | Status: DC

## 2015-07-30 MED ORDER — METHYLPREDNISOLONE ACETATE 80 MG/ML IJ SUSP
80.0000 mg | Freq: Once | INTRAMUSCULAR | Status: AC
  Administered 2015-07-30: 80 mg via INTRAMUSCULAR

## 2015-07-30 NOTE — Progress Notes (Signed)
   Subjective:    Patient ID: Vernon Fisher, male    DOB: 13-Jul-1955, 60 y.o.   MRN: [id number]  HPI  this patient recently seen for sinus infection placed on Augmentin he is on the last day of Augmentin started noticing a rash on his right butt cheek that is itching he also noticed a funny sensation in his throat in over the past week he states his been difficult to swallow. He denies any difficulty breathing denies any wheezing denies nausea or vomiting does state he feels weak when he stands up and is noticed that his urine is been dark yellow he is not been eating or drinking well because he has not been feeling well. Denies high fever sweats chills nausea vomiting diarrhea is never had this problem for never had any significant allergic reactions before.   Review of Systems  currently patient relates sore throat denies difficulty breathing denies chest pressure or pain relates a little bit of lightheadedness when he stands up denies vomiting has had some nausea no diarrhea does relate some itching on his bottom    Objective:   Physical Exam  hives noted on the right by Dr. No no other place her is no wheezing no tachycardia blood pressure is good but blood pressure taken laying Lane sitting standing does show a drop in blood pressure indicative of not taking in good liquids his neck has no masses no swelling is throat appears normal there is no obvious swelling with it there is no stridor no difficulty with him eating or drinking or conversing while he is in the office today       Assessment & Plan:   Depo-Medrol shot given Benadryl 50 mg given  patient was observed for proximally 30 minutes. Did not have any difficulty breathing or swallowing.  stop Augmentin Doxycycline twice a day Prednisone taper  patient was warned in detail what severe allergic reaction would be in if so he is to immediately call 911. I do not feel patient needs EpiPen currently. Patient is comfortable going  home. Was able to drink tall glass liquids and eat pretzels without difficulty

## 2015-08-06 ENCOUNTER — Ambulatory Visit (INDEPENDENT_AMBULATORY_CARE_PROVIDER_SITE_OTHER): Payer: BLUE CROSS/BLUE SHIELD | Admitting: Orthopedic Surgery

## 2015-08-06 ENCOUNTER — Encounter: Payer: Self-pay | Admitting: Orthopedic Surgery

## 2015-08-06 VITALS — BP 139/89 | HR 75 | Temp 97.7°F | Ht 71.0 in | Wt 219.0 lb

## 2015-08-06 DIAGNOSIS — M75102 Unspecified rotator cuff tear or rupture of left shoulder, not specified as traumatic: Secondary | ICD-10-CM

## 2015-08-06 NOTE — Progress Notes (Signed)
Patient ID: Eveline Keto, male   DOB: Jul 15, 1955, 60 y.o.   MRN: [id number]  Chief Complaint  Patient presents with  . Shoulder Pain    left     BP 139/89 mmHg  Pulse 75  Temp(Src) 97.7 F (36.5 C)  Ht  (1.803 m)  Wt 219 lb (99.338 kg)  BMI 30.56 kg/m2  Pain left shoulder after therapy and injection  Ros: neck pain radiating to left arm   AAO x 3  Mood normal  BP 139/89 mmHg  Pulse 75  Temp(Src) 97.7 F (36.5 C)  Ht  (1.803 m)  Wt 219 lb (99.338 kg)  BMI 30.56 kg/m2   + impingement normal strength Normal stability test  nurovascular exam intact    ASSESSMENT AND PLAN   Left shoulder impingement    Injected again  Procedure note the subacromial injection shoulder left   Verbal consent was obtained to inject the  Left   Shoulder  Timeout was completed to confirm the injection site is a subacromial space of the  left  shoulder  Medication used Depo-Medrol 40 mg and lidocaine 1% 3 cc  Anesthesia was provided by ethyl chloride  The injection was performed in the left  posterior subacromial space. After pinning the skin with alcohol and anesthetized the skin with ethyl chloride the subacromial space was injected using a 20-gauge needle. There were no complications  Sterile dressing was applied.    Return in 2 weeks for xrays neck

## 2015-08-06 NOTE — Patient Instructions (Signed)

## 2015-08-07 NOTE — Addendum Note (Signed)
Addended by: Fuller Canada E on: 08/07/2015 11:08 AM   Modules accepted: Kipp Brood

## 2015-08-20 ENCOUNTER — Encounter: Payer: Self-pay | Admitting: Family Medicine

## 2015-08-20 ENCOUNTER — Ambulatory Visit (INDEPENDENT_AMBULATORY_CARE_PROVIDER_SITE_OTHER): Payer: BLUE CROSS/BLUE SHIELD | Admitting: Family Medicine

## 2015-08-20 VITALS — BP 148/92 | Temp 98.2°F | Ht 71.0 in | Wt 220.0 lb

## 2015-08-20 DIAGNOSIS — J329 Chronic sinusitis, unspecified: Secondary | ICD-10-CM | POA: Diagnosis not present

## 2015-08-20 DIAGNOSIS — J310 Chronic rhinitis: Secondary | ICD-10-CM

## 2015-08-20 MED ORDER — HYDROCODONE-HOMATROPINE 5-1.5 MG/5ML PO SYRP: ORAL_SOLUTION | ORAL | Status: DC

## 2015-08-20 MED ORDER — LEVOFLOXACIN 500 MG PO TABS: 500.0000 mg | ORAL_TABLET | Freq: Every day | ORAL | Status: AC

## 2015-08-20 NOTE — Progress Notes (Signed)
   Subjective:    Patient ID: Vernon Fisher, male    DOB: Dec 07, 1955, 60 y.o.   MRN: [id number]  Cough This is a new problem. Episode onset: 1 week. Associated symptoms include ear pain, headaches, nasal congestion and a sore throat. Associated symptoms comments: Vomiting .    Diminished energy. Frontal headache. Mild sore throat at times.   At times fairly substantial cough worse at night. Associated with vomiting when it gets bad next. No high fever but some chills and night sweats  Review of Systems  HENT: Positive for ear pain and sore throat.   Respiratory: Positive for cough.   Neurological: Positive for headaches.       Objective:   Physical Exam  Alert, mild malaise. Hydration good Vitals stable. frontal/ maxillary tenderness evident positive nasal congestion. pharynx normal neck supple  lungs clear/no crackles or wheezes. heart regular in rhythm       Assessment & Plan:  Impression rhinosinusitis likely post viral, discussed with patient. plan antibiotics prescribed. Questions answered. Symptomatic care discussed. warning signs discussed. WSL

## 2015-08-25 ENCOUNTER — Ambulatory Visit (INDEPENDENT_AMBULATORY_CARE_PROVIDER_SITE_OTHER): Payer: BLUE CROSS/BLUE SHIELD

## 2015-08-25 ENCOUNTER — Ambulatory Visit (INDEPENDENT_AMBULATORY_CARE_PROVIDER_SITE_OTHER): Payer: BLUE CROSS/BLUE SHIELD | Admitting: Orthopedic Surgery

## 2015-08-25 ENCOUNTER — Encounter: Payer: Self-pay | Admitting: Orthopedic Surgery

## 2015-08-25 VITALS — BP 125/80 | Ht 71.0 in | Wt 220.0 lb

## 2015-08-25 DIAGNOSIS — M75102 Unspecified rotator cuff tear or rupture of left shoulder, not specified as traumatic: Secondary | ICD-10-CM | POA: Diagnosis not present

## 2015-08-25 DIAGNOSIS — M47812 Spondylosis without myelopathy or radiculopathy, cervical region: Secondary | ICD-10-CM | POA: Diagnosis not present

## 2015-08-25 DIAGNOSIS — M542 Cervicalgia: Secondary | ICD-10-CM

## 2015-08-25 NOTE — Progress Notes (Signed)
Follow-up visit  Chief complaint Chief Complaint  Patient presents with  . Follow-up    2 week follow up + xray neck    History the patient has had a workup for left shoulder pain and rotator cuff syndromes had physical therapy and injection. He still has painful forward elevation of the shoulder and weakness with forward elevation of the shoulder  Review of systems he has some radicular symptoms in the left hand with numbness and tingling which can occur while driving or just intermittently denies fever or chills  Past Medical History  Diagnosis Date  . GERD (gastroesophageal reflux disease)   . Hypertension   . Diabetes mellitus without complication (HCC)   . Migraine headache   . Degenerative disc disease   . Chronic pain   . Fibromyalgia   . Reflux   . Plantar fasciitis   . PONV (postoperative nausea and vomiting)   . Depression    Physical Exam  Constitutional: He is oriented to person, place, and time. He appears well-developed and well-nourished. No distress.  Neck: No JVD present. No tracheal deviation present. No thyromegaly present.  Cervical spine tenderness C5-6 and left trap with increased muscle spasm and decreased range of motion on rotation alignment otherwise normal  Lymphadenopathy:    He has no cervical adenopathy.  Neurological: He is alert and oriented to person, place, and time. He has normal reflexes. He exhibits normal muscle tone.  Skin: Skin is warm and dry. No rash noted. He is not diaphoretic. No erythema.  Psychiatric: He has a normal mood and affect. His behavior is normal. Judgment and thought content normal.   Left shoulder Rotator cuff strength remains 5 minus over 5 with the supraspinatus the internal and external rotators are normal. He does have positive impingement sign, stability normal, palpable tenderness in the rotator interval  Right shoulder no swelling or tenderness or crepitation around the right shoulder. Range of motion assessment  full internal and external rotation and elevation. No instability or luxation of the joints and muscle tone was normal  At this point it is difficult to tell which problem is causing him the most symptoms numbness and tingling in the left arm or obvious related to his cervical spine   Medical decision-making section An x-ray was ordered today; my reading is  he has a cervical spine disc at C5-C6 with uncovertebral joint space narrowing and loss of cervical contour in terms of lordosis  So we will do an MRI to check the rotator cuff to see if it needs repair or if he needs a decompression of the shoulder. If that is normal without tearing that is surgical then he will need a workup for his cervical disc problem  Addendum to review of systems he also has lower back pain with bilateral leg pain  He had an MRI back in 2012 which showed disc disease at 3 levels and spondylosis at 3 levels.(Report reviewed only)  That was reviewed report only.  So current plan is MRI of the shoulder follow-up for review of that and then make recommendations in terms of further treatment regarding his neck.

## 2015-08-25 NOTE — Patient Instructions (Signed)
WE WILL SCHEDULE MRI AND CALL YOU WITH APPOINTMENT 

## 2015-08-26 ENCOUNTER — Telehealth: Payer: Self-pay | Admitting: *Deleted

## 2015-08-26 NOTE — Telephone Encounter (Signed)
PER [employer] DOES NOT MEET CRITERIA FOR MRI APPROVAL  MD MAY CALL [phone] OR WITHDRAW REQUEST

## 2015-08-27 NOTE — Telephone Encounter (Signed)
Tell him and send to therapy for c spine x 6 weeks then f/u with me

## 2015-08-29 ENCOUNTER — Other Ambulatory Visit: Payer: Self-pay | Admitting: *Deleted

## 2015-08-29 DIAGNOSIS — M542 Cervicalgia: Secondary | ICD-10-CM

## 2015-08-29 DIAGNOSIS — M25512 Pain in left shoulder: Secondary | ICD-10-CM

## 2015-08-29 NOTE — Telephone Encounter (Signed)
Called patient and scheduled appointment as noted.

## 2015-08-29 NOTE — Telephone Encounter (Signed)
Patient aware, pt order printed, please schedule 6 week follow up, thanks

## 2015-09-09 ENCOUNTER — Ambulatory Visit (HOSPITAL_COMMUNITY): Payer: BLUE CROSS/BLUE SHIELD | Attending: Orthopedic Surgery

## 2015-09-09 ENCOUNTER — Encounter (HOSPITAL_COMMUNITY): Payer: Self-pay

## 2015-09-09 DIAGNOSIS — M25512 Pain in left shoulder: Secondary | ICD-10-CM | POA: Insufficient documentation

## 2015-09-09 DIAGNOSIS — R29898 Other symptoms and signs involving the musculoskeletal system: Secondary | ICD-10-CM | POA: Diagnosis present

## 2015-09-09 DIAGNOSIS — M25612 Stiffness of left shoulder, not elsewhere classified: Secondary | ICD-10-CM | POA: Insufficient documentation

## 2015-09-09 DIAGNOSIS — M75102 Unspecified rotator cuff tear or rupture of left shoulder, not specified as traumatic: Secondary | ICD-10-CM | POA: Insufficient documentation

## 2015-09-09 NOTE — Patient Instructions (Signed)
Use heat on your left side prior to exercises for 10-15 minutes.   Upper Trapezius Mobilization with ball  Pin down tender portion of upper trapezius. Roll ball across tender point for at least 2 minutes Other options: Pin down tender spot and perform active arm motions or contract/relax technique.     Cervical- Ball supported neck rotation  Lay your head on a mostly deflated ball or small pillow.  Slowly rotate your head right and left gently stretching in a pain free manner. 10 times slowly each side.     W  In Standing (or sitting) start with your arms in the shape of a 'W'.  Press the arms backwards.  Keep your neck in neutral and avoid sticking the chin or head forward.  10 times    Median N mobilization  With hand on wall, turn trunk opposite direction until tolerable pulling/ stretching is felt in chest, front of arm or any reproduction of symptoms occur and then turn the opposite way for an "on and off" pattern.   10 times slowly.   Low Pec Stretch  Step into doorway with one foot in front of other. Elbows below shoulders, use hips to push forward into doorway. 2 sets 20 seconds hold.    Left Trapezius Stretch  Begin by sitting up straight.  Rotate your chin toward your left shoulder. Keeping this rotation, side bend to the right, with your right ear coming toward your right shoulder. Gently  grab your head  with your right arm and apply slight pressure. You will feel this stretch down the left side of your neck. Hold 20 seconds. Repeat 2 times.    WALL POSTURE  Stand with your heels up against a wall.   Attempt to get your heels, buttock, shoulders and head to touch the wall at the same time.  Hold 20 seconds complete 2 times.

## 2015-09-09 NOTE — Therapy (Signed)
[place] [facility] [address] [place], [place], [address] Phone: [phone]   Fax:  [phone]  Occupational Therapy Evaluation  Patient Details  Name: Vernon KetoRobert L Fisher MRN: [id number] Date of Birth: 01/01/1956 Referring Provider: Fuller CanadaStanley Harrison, MD  Encounter Date: 09/09/2015      OT End of Session - 09/09/15 0940    Visit Number 1   Number of Visits 18   Date for OT Re-Evaluation 11/08/15  Mini reassess: 10/07/15   Authorization Type [employer] select   Authorization Time Period Unsure about visist limit. Mandy to find out by next session.    OT Start Time 0800   OT Stop Time 0845   OT Time Calculation (min) 45 min   Activity Tolerance Patient tolerated treatment well   Behavior During Therapy WFL for tasks assessed/performed      Past Medical History  Diagnosis Date  . GERD (gastroesophageal reflux disease)   . Hypertension   . Diabetes mellitus without complication (HCC)   . Migraine headache   . Degenerative disc disease   . Chronic pain   . Fibromyalgia   . Reflux   . Plantar fasciitis   . PONV (postoperative nausea and vomiting)   . Depression     Past Surgical History  Procedure Laterality Date  . Bilateral carpal tunnel release    . Right rotator cuff repair    . Nose surgery    . Colonoscopy  06/08/2011    Procedure: COLONOSCOPY;  Surgeon: Arlyce HarmanSandi M Fields, MD;  Location: [facility];  Service: Endoscopy;  Laterality: N/A;  11:15 AM  . Knee arthroscopy with medial menisectomy Left 09/06/2014    Procedure: KNEE ARTHROSCOPY WITH LIMITED DEBRIDEMENT;  Surgeon: Vickki HearingStanley E Harrison, MD;  Location: [facility] ORS;  Service: Orthopedics;  Laterality: Left;    There were no vitals filed for this visit.  Visit Diagnosis:  Shoulder stiffness, left - Plan: Ot plan of care cert/re-cert  Shoulder pain, left - Plan: Ot plan of care cert/re-cert  Shoulder weakness - Plan: Ot plan of care cert/re-cert      Subjective Assessment -  09/09/15 0911    Subjective  S: It's still bad and my neck is worse that it was before.    Pertinent History Patient is a 60 y/o male returning to Outpatient OT S/P left shoulder and cervical pain which began in the beginning of 2016. When patient was previously attending OT he did show progress with shoulder ROM and strength although his pain continued to be a deficit. Insurance will not approve a MRI at this time. X-Ray of neck showed joint arthritis at C5 and 6 with anterior osteophyte at C5-C6 and joint space narrowing C5-C6 and C5-C6 degenerative disc disease. Dr. Romeo AppleHarrison has referred patient to occupational therapy once again for evaluation and treatment.    Special Tests FOTO to be completed at next session.    Patient Stated Goals To decrease pain level.    Currently in Pain? Yes   Pain Score 5    Pain Location Shoulder   Pain Orientation Left   Pain Descriptors / Indicators Aching;Constant   Pain Type Chronic pain   Pain Onset More than a month ago   Pain Frequency Constant   Aggravating Factors  Increased use of LUE   Pain Relieving Factors Nothing gets rid of the pain completely.   Effect of Pain on Daily Activities Effects every aspect of daily life.    Multiple Pain Sites No  [facility] OT Assessment - 09/09/15 1610    Assessment   Diagnosis left rotator cuff syndrome   Referring Provider Fuller Canada, MD   Onset Date --  Began at the start of 2016.   Assessment 10/07/15   Prior Therapy Pt received OP OT at this clinic for Left rotator cuff syndrome 05/21/15-06/17/15   Precautions   Precautions None   Restrictions   Weight Bearing Restrictions No   Balance Screen   Has the patient fallen in the past 6 months No   Home  Environment   Family/patient expects to be discharged to: Private residence   Prior Function   Level of Independence Independent   Vocation Unemployed   ADL   ADL comments Difficulty looking right and left when driving, completing all daily  tasks are difficult, unable to get a good night's sleep.    Mobility   Mobility Status Independent   Written Expression   Dominant Hand Right   Vision - History   Baseline Vision Wears glasses all the time   Cognition   Overall Cognitive Status Within Functional Limits for tasks assessed   ROM / Strength   AROM / PROM / Strength AROM;PROM;Strength   Palpation   Palpation comment Max fascial restrictions in left upper trapezius and scapularis region.    AROM   Overall AROM Comments Assessed seated. IR/er abducted.    AROM Assessment Site Shoulder;Cervical   Right/Left Shoulder Left   Left Shoulder Flexion 135 Degrees   Left Shoulder ABduction 102 Degrees   Left Shoulder Internal Rotation 75 Degrees   Left Shoulder External Rotation 73 Degrees   Cervical Flexion 75   Cervical Extension 46   Cervical - Right Side Bend 40   Cervical - Left Side Bend 40   Cervical - Right Rotation 45   Cervical - Left Rotation 45   PROM   Overall PROM  Within functional limits for tasks performed   PROM Assessment Site Shoulder   Right/Left Shoulder Left   Strength   Overall Strength Comments Assessed seated. IR/er abducted   Strength Assessment Site Shoulder   Left Shoulder Flexion 5/5   Left Shoulder ABduction 5/5   Left Shoulder Internal Rotation 4+/5   Left Shoulder External Rotation 4-/5                  OT Treatments/Exercises (OP) - 09/09/15 1001    Exercises   Exercises Shoulder   Modalities   Modalities Moist Heat   Moist Heat Therapy   Number Minutes Moist Heat 10 Minutes   Moist Heat Location Cervical;Shoulder               OT Education - 09/09/15 0928    Education provided Yes   Education Details Upper trapezius and cervical stretches. Myofascial release   Person(s) Educated Patient   Methods Explanation;Demonstration;Verbal cues;Handout   Comprehension Verbalized understanding          OT Short Term Goals - 09/09/15 0955    OT SHORT TERM GOAL #1    Title Patient will be educated and independent with HEP to increase functional use of LUE and cervical ROM during daily tasks.    Time 3   Period Weeks   Status New   OT SHORT TERM GOAL #2   Title Patient will decrease pain level in LUE and next to 3/10 or less during daily tasks.    Time 3   Period Weeks   Status New   OT SHORT TERM  GOAL #3   Title Patient will increase A/ROM of LUE to [facility] to increase ability to complete activities above head level.    Time 3   Period Weeks   Status New   OT SHORT TERM GOAL #4   Title Patient will increase strength in LUE shoulder suring external and internal rotation  to 5/5 to increase ability to complete activities out away from body.    Time 3   Period Weeks   Status New   OT SHORT TERM GOAL #5   Title Patient will decrease fascial restrictions to mod amount to increase functional mobility and decrease pain level in LUE.   Time 3   Period Weeks   Status New   Additional Short Term Goals   Additional Short Term Goals Yes           OT Long Term Goals - 09/09/15 0957    OT LONG TERM GOAL #1   Title Patient will return to highest level of independence with all daily tasks using LUE.    Time 6   Period Weeks   Status New   OT LONG TERM GOAL #2   Title Patient will increase scapular strength and mobility to increase overall posture and decrease pain level when completing daily tasks.   Time 6   Period Weeks   Status New   OT LONG TERM GOAL #3   Title Patient willl increase cervical A/ROM during rotation left and right to [facility] to increase ability to look both ways at traffic when driving.    Time 6   Period Weeks   Status New   OT LONG TERM GOAL #4   Title Patient will decrease fascial restrictions to min amount in left UE and cervical region.    Time 6   Period Weeks   Status New               Plan - 09/09/15 0941    Clinical Impression Statement A: Patient is a 60 y/o male S/P left shoulder and cervical pain causing  increased fascial restrictions and pain and decreased ROM and strength resulting in difficulty completing daily tasks using LUE.   Pt will benefit from skilled therapeutic intervention in order to improve on the following deficits (Retired) Decreased strength;Pain;Impaired UE functional use;Increased fascial restricitons;Decreased range of motion   Rehab Potential Excellent   OT Frequency 3x / week   OT Duration 6 weeks   OT Treatment/Interventions Self-care/ADL training;Ultrasound;DME and/or AE instruction;Iontophoresis;Passive range of motion;Patient/family education;Cryotherapy;Electrical Stimulation;Moist Heat;Therapeutic activities;Manual Therapy;Therapeutic exercises   Plan P: Pt will benefit from skilled OT services to increase functional performance during daily tasks. Treatment Plan: If able use moist heat for 5-10 minutes prior to therapy. Myofascial release to left upp trapezius and cervical region. Passive stretching to LUE and neck. A/ROM and general mobilization exercises. Scapular strengthening.    Consulted and Agree with Plan of Care Patient        Problem List Patient Active Problem List   Diagnosis Date Noted  . Other synovitis and tenosynovitis, unspecified lower leg   . Chronic pain syndrome 06/24/2014  . Abnormal fasting glucose 02/11/2014  . Fibromyalgia 09/29/2013  . Other malaise and fatigue 01/09/2013  . Chest pain 07/17/2012  . Essential hypertension 07/17/2012  . Arthritis 07/17/2012    [name], OTR/L,CBIS  [phone]  09/09/2015, 10:04 AM  [place] [facility] [address] [place], [place], [address] Phone: [phone]   Fax:  [phone]  Name: Vernon Fisher MRN: [id number] Date of Birth: Jul 05, 1956

## 2015-09-10 ENCOUNTER — Encounter (HOSPITAL_COMMUNITY): Payer: Self-pay | Admitting: Occupational Therapy

## 2015-09-10 ENCOUNTER — Ambulatory Visit (HOSPITAL_COMMUNITY): Payer: BLUE CROSS/BLUE SHIELD | Admitting: Occupational Therapy

## 2015-09-10 DIAGNOSIS — R29898 Other symptoms and signs involving the musculoskeletal system: Secondary | ICD-10-CM

## 2015-09-10 DIAGNOSIS — M25612 Stiffness of left shoulder, not elsewhere classified: Secondary | ICD-10-CM

## 2015-09-10 DIAGNOSIS — M25512 Pain in left shoulder: Secondary | ICD-10-CM

## 2015-09-10 NOTE — Therapy (Signed)
[place] [facility] [address] [place], [place], [address] Phone: [phone]   Fax:  [phone]  Occupational Therapy Treatment  Patient Details  Name: CLIFFORD BENNINGER MRN: [id number] Date of Birth: 1955/10/12 Referring Provider: Fuller Canada, MD  Encounter Date: 09/10/2015      OT End of Session - 09/10/15 1149    Visit Number 2   Number of Visits 18   Date for OT Re-Evaluation 11/08/15  Mini reassess: 10/07/15   Authorization Type BCBS select   Authorization Time Period Unsure about visist limit. Mandy to find out by next session.    OT Start Time 0930   OT Stop Time 1015   OT Time Calculation (min) 45 min   Activity Tolerance Patient tolerated treatment well   Behavior During Therapy WFL for tasks assessed/performed      Past Medical History  Diagnosis Date  . GERD (gastroesophageal reflux disease)   . Hypertension   . Diabetes mellitus without complication (HCC)   . Migraine headache   . Degenerative disc disease   . Chronic pain   . Fibromyalgia   . Reflux   . Plantar fasciitis   . PONV (postoperative nausea and vomiting)   . Depression     Past Surgical History  Procedure Laterality Date  . Bilateral carpal tunnel release    . Right rotator cuff repair    . Nose surgery    . Colonoscopy  06/08/2011    Procedure: COLONOSCOPY;  Surgeon: [name], MD;  [facility];  Service: Endoscopy;  Laterality: N/A;  11:15 AM  . Knee arthroscopy with medial menisectomy Left 09/06/2014    Procedure: KNEE ARTHROSCOPY WITH LIMITED DEBRIDEMENT;  Surgeon: Vickki Hearing, MD;  Location: [facility];  Service: Orthopedics;  Laterality: Left;    There were no vitals filed for this visit.  Visit Diagnosis:  Shoulder stiffness, left  Shoulder pain, left  Shoulder weakness      Subjective Assessment - 09/10/15 0930    Subjective  S: I can't turn my head to look over my shoulder when I'm driving.    Patient Stated  Goals FOTO Score: 56/100 (44% impairment)   Currently in Pain? Yes   Pain Score 5    Pain Location Shoulder   Pain Orientation Left   Pain Descriptors / Indicators Aching;Constant   Pain Type Chronic pain   Pain Onset More than a month ago   Pain Frequency Constant   Aggravating Factors  using LUE            [facility] OT Assessment - 09/10/15 0929    Assessment   Diagnosis left rotator cuff syndrome   Precautions   Precautions None                  OT Treatments/Exercises (OP) - 09/10/15 0952    Exercises   Exercises Neck;Shoulder   Neck Exercises: Seated   Cervical Isometrics Flexion;Extension;Right lateral flexion;Left lateral flexion;10 secs  3 reps   Neck Exercises: Supine   Cervical Rotation Both;5 reps  P/ROM   Lateral Flexion Both;5 reps  P/ROM   Other Supine Exercise manual traction, 3x, 5 seconds each   Shoulder Exercises: Supine   Protraction PROM;5 reps;AROM;10 reps   Horizontal ABduction PROM;5 reps;AROM;10 reps   External Rotation PROM;5 reps;AROM;10 reps   Internal Rotation PROM;5 reps;AROM;10 reps   Flexion PROM;5 reps;AROM;10 reps   ABduction PROM;5 reps;AROM;10 reps   Shoulder Exercises: Standing   Extension  Theraband;10 reps   Theraband Level (Shoulder Extension) Level 2 (Red)   Row Theraband;10 reps   Theraband Level (Shoulder Row) Level 2 (Red)   Modalities   Modalities Moist Heat   Moist Heat Therapy   Number Minutes Moist Heat 10 Minutes   Moist Heat Location Cervical;Shoulder  prior to manual therapy   Manual Therapy   Manual Therapy Myofascial release   Manual therapy comments manual therapy completed prior to exercises   Myofascial Release Myofascial release to left upper arm, deltoid, and trapezius regions to decrease pain and fascial restrictions and pain, and increase joint range of motion                OT Education - 09/09/15 0928    Education provided Yes   Education Details Upper trapezius and cervical  stretches. Myofascial release   Person(s) Educated Patient   Methods Explanation;Demonstration;Verbal cues;Handout   Comprehension Verbalized understanding          OT Short Term Goals - 09/09/15 0955    OT SHORT TERM GOAL #1   Title Patient will be educated and independent with HEP to increase functional use of LUE and cervical ROM during daily tasks.    Time 3   Period Weeks   Status New   OT SHORT TERM GOAL #2   Title Patient will decrease pain level in LUE and next to 3/10 or less during daily tasks.    Time 3   Period Weeks   Status New   OT SHORT TERM GOAL #3   Title Patient will increase A/ROM of LUE to [facility] to increase ability to complete activities above head level.    Time 3   Period Weeks   Status New   OT SHORT TERM GOAL #4   Title Patient will increase strength in LUE shoulder suring external and internal rotation  to 5/5 to increase ability to complete activities out away from body.    Time 3   Period Weeks   Status New   OT SHORT TERM GOAL #5   Title Patient will decrease fascial restrictions to mod amount to increase functional mobility and decrease pain level in LUE.   Time 3   Period Weeks   Status New   Additional Short Term Goals   Additional Short Term Goals Yes           OT Long Term Goals - 09/09/15 0957    OT LONG TERM GOAL #1   Title Patient will return to highest level of independence with all daily tasks using LUE.    Time 6   Period Weeks   Status New   OT LONG TERM GOAL #2   Title Patient will increase scapular strength and mobility to increase overall posture and decrease pain level when completing daily tasks.   Time 6   Period Weeks   Status New   OT LONG TERM GOAL #3   Title Patient willl increase cervical A/ROM during rotation left and right to [facility] to increase ability to look both ways at traffic when driving.    Time 6   Period Weeks   Status New   OT LONG TERM GOAL #4   Title Patient will decrease fascial restrictions to  min amount in left UE and cervical region.    Time 6   Period Weeks   Status New               Plan - 09/10/15 1149  Clinical Impression Statement A: Initiatied myofascial release, manual stretching, shoulder A/ROM, cervical isometrics, and scapular theraband exercises. Applied moist heat at beginning of session to trapezius. Pt reports feeling good during manual stretching to cervical region. Pt experienced one episode of brief dizziness after cervical isometrics this session. Pt reports minimal increase in soreness of shoulder at end of session.     Plan P: Perform vertebral artery test prior to attempting cervical isometrics-if positive discontinue isometrics. Work on improving form and independence in scapular theraband exercises. Continue moist heat at beginning of session.         Problem List Patient Active Problem List   Diagnosis Date Noted  . Other synovitis and tenosynovitis, unspecified lower leg   . Chronic pain syndrome 06/24/2014  . Abnormal fasting glucose 02/11/2014  . Fibromyalgia 09/29/2013  . Other malaise and fatigue 01/09/2013  . Chest pain 07/17/2012  . Essential hypertension 07/17/2012  . Arthritis 07/17/2012    Ezra SitesLeslie Troxler, OTR/L  [phone]  09/10/2015, 11:56 AM  [facility] [address] [place], [place], [address] Phone: [phone]   Fax:  [phone]  Name: Eveline KetoRobert L Moodie MRN: [id number] Date of Birth: 1956-02-18

## 2015-09-17 ENCOUNTER — Telehealth (HOSPITAL_COMMUNITY): Payer: Self-pay | Admitting: Occupational Therapy

## 2015-09-17 ENCOUNTER — Encounter (HOSPITAL_COMMUNITY): Payer: BLUE CROSS/BLUE SHIELD | Admitting: Occupational Therapy

## 2015-09-17 NOTE — Telephone Encounter (Signed)
Called and left message for pt regarding no-show for 8:45 appt today, reminded of 11:00 appointment tomorrow 3/16. Asked pt to call if unable to make next appt.   Ezra SitesLeslie Shari Natt, OTR/L  [phone] 09/17/2015

## 2015-09-18 ENCOUNTER — Encounter (HOSPITAL_COMMUNITY): Payer: BLUE CROSS/BLUE SHIELD

## 2015-09-22 ENCOUNTER — Encounter (HOSPITAL_COMMUNITY): Payer: Self-pay

## 2015-09-22 ENCOUNTER — Ambulatory Visit (HOSPITAL_COMMUNITY): Payer: BLUE CROSS/BLUE SHIELD

## 2015-09-22 DIAGNOSIS — M25612 Stiffness of left shoulder, not elsewhere classified: Secondary | ICD-10-CM

## 2015-09-22 DIAGNOSIS — M25512 Pain in left shoulder: Secondary | ICD-10-CM

## 2015-09-22 DIAGNOSIS — R29898 Other symptoms and signs involving the musculoskeletal system: Secondary | ICD-10-CM

## 2015-09-22 NOTE — Therapy (Addendum)
[place] [facility] [address] [place], [place], [address] Phone: [phone]   Fax:  [phone]  Occupational Therapy Treatment  Patient Details  Name: Vernon Fisher MRN: [id number] Date of Birth: 04/09/1956 Referring Provider: Fuller Canada, MD  Encounter Date: 09/22/2015      OT End of Session - 09/22/15 1245    Visit Number 3   Number of Visits 18   Date for OT Re-Evaluation 11/08/15  Mini reassess: 10/07/15   Authorization Type BCBS select   Authorization Time Period Unsure about visist limit. Mandy to find out by next session.    OT Start Time 0935   OT Stop Time 1017   OT Time Calculation (min) 42 min   Activity Tolerance Patient tolerated treatment well   Behavior During Therapy WFL for tasks assessed/performed      Past Medical History  Diagnosis Date  . GERD (gastroesophageal reflux disease)   . Hypertension   . Diabetes mellitus without complication (HCC)   . Migraine headache   . Degenerative disc disease   . Chronic pain   . Fibromyalgia   . Reflux   . Plantar fasciitis   . PONV (postoperative nausea and vomiting)   . Depression     Past Surgical History  Procedure Laterality Date  . Bilateral carpal tunnel release    . Right rotator cuff repair    . Nose surgery    . Colonoscopy  06/08/2011    Procedure: COLONOSCOPY;  Surgeon: [name], MD;  [facility];  Service: Endoscopy;  Laterality: N/A;  11:15 AM  . Knee arthroscopy with medial menisectomy Left 09/06/2014    Procedure: KNEE ARTHROSCOPY WITH LIMITED DEBRIDEMENT;  Surgeon: Vickki Hearing, MD;  Location: [facility];  Service: Orthopedics;  Laterality: Left;    There were no vitals filed for this visit.  Visit Diagnosis:  Shoulder stiffness, left  Shoulder pain, left  Shoulder weakness      Subjective Assessment - 09/22/15 0943    Subjective  I have pain all over.   Currently in Pain? Yes   Pain Score 5    Pain Location  Generalized   Pain Descriptors / Indicators Aching;Constant   Pain Type Chronic pain   Pain Radiating Towards N/A. Patient states that pain is throughout his entire body.    Pain Onset More than a month ago   Pain Frequency Constant   Aggravating Factors  Using the LUE   Pain Relieving Factors Pain is constant. Never completely goes away.    Effect of Pain on Daily Activities Effects every aspect of daily life.    Multiple Pain Sites No            [facility] OT Assessment - 09/22/15 1533    Assessment   Diagnosis left rotator cuff syndrome   Precautions   Precautions None                  OT Treatments/Exercises (OP) - 09/22/15 0951    Exercises   Exercises Neck;Shoulder   Shoulder Exercises: Supine   Protraction PROM;5 reps   Horizontal ABduction PROM;5 reps   External Rotation PROM;5 reps   Internal Rotation PROM;5 reps   Flexion PROM;5 reps   ABduction PROM;5 reps   Shoulder Exercises: Standing   Protraction AROM;10 reps   Extension Theraband;12 reps   Theraband Level (Shoulder Extension) Level 2 (Red)   Row Theraband;12 reps   Theraband Level (Shoulder Row) Level 2 (Red)  Retraction Theraband;12 reps   Theraband Level (Shoulder Retraction) Level 2 (Red)   Shoulder Exercises: ROM/Strengthening   "W" Arms 10X   Modalities   Modalities Moist Heat   Moist Heat Therapy   Number Minutes Moist Heat 10 Minutes   Moist Heat Location Cervical;Shoulder   Manual Therapy   Manual Therapy Myofascial release   Manual therapy comments manual therapy completed prior to exercises   Myofascial Release Myofascial release to left upper arm, deltoid, and trapezius regions to decrease pain and fascial restrictions and pain, and increase joint range of motion   Muscle Energy Technique Muscle energy technique complete to left anterior deltoid to decrease muscle spasm and increase joint mobility and ROM.                 OT Education - 09/22/15 1257    Education  provided Yes   Education Details Vertebral artery test performed, patient educated on purpose and results. Result negative.   Person(s) Educated Patient   Methods Explanation   Comprehension Verbalized understanding          OT Short Term Goals - 09/22/15 1259    OT SHORT TERM GOAL #1   Title Patient will be educated and independent with HEP to increase functional use of LUE and cervical ROM during daily tasks.    Time 3   Period Weeks   Status On-going   OT SHORT TERM GOAL #2   Title Patient will decrease pain level in LUE and next to 3/10 or less during daily tasks.    Time 3   Period Weeks   Status On-going   OT SHORT TERM GOAL #3   Title Patient will increase A/ROM of [facility] to [facility] to increase ability to complete activities above head level.    Time 3   Period Weeks   Status On-going   OT SHORT TERM GOAL #4   Title Patient will increase strength in LUE shoulder suring external and internal rotation  to 5/5 to increase ability to complete activities out away from body.    Time 3   Period Weeks   Status On-going   OT SHORT TERM GOAL #5   Title Patient will decrease fascial restrictions to mod amount to increase functional mobility and decrease pain level in LUE.   Time 3   Period Weeks   Status On-going           OT Long Term Goals - 09/22/15 1259    OT LONG TERM GOAL #1   Title Patient will return to highest level of independence with all daily tasks using LUE.    Time 6   Period Weeks   Status On-going   OT LONG TERM GOAL #2   Title Patient will increase scapular strength and mobility to increase overall posture and decrease pain level when completing daily tasks.   Time 6   Period Weeks   Status On-going   OT LONG TERM GOAL #3   Title Patient willl increase cervical A/ROM during rotation left and right to [facility] to increase ability to look both ways at traffic when driving.    Time 6   Period Weeks   Status On-going   OT LONG TERM GOAL #4   Title Patient will  decrease fascial restrictions to min amount in left UE and cervical region.    Time 6   Period Weeks   Status On-going  Plan - 09/22/15 1246    Clinical Impression Statement A: Vertebral Artery test negative. Patient reports some dizziness after isometric neck exercises last session. Isometric exercises not completed this session due to patient's reports of dizziness. Patient had good results with myofascial release technique although continues to have increased muscle tightness in LUE.    Plan P: Attempt ES for pain management. Complete cervical ROM/stretches. Continue to focus on posture education when completing exercises and strengthning scapular muscles.         Problem List Patient Active Problem List   Diagnosis Date Noted  . Other synovitis and tenosynovitis, unspecified lower leg   . Chronic pain syndrome 06/24/2014  . Abnormal fasting glucose 02/11/2014  . Fibromyalgia 09/29/2013  . Other malaise and fatigue 01/09/2013  . Chest pain 07/17/2012  . Essential hypertension 07/17/2012  . Arthritis 07/17/2012    [name], OTR/L,CBIS  [phone]  09/22/2015, 3:34 PM  [facility] [address] [place], [place], [address] Phone: [phone]   Fax:  [phone]  Name: Vernon Fisher MRN: [id number] Date of Birth: 1956/04/02

## 2015-09-24 ENCOUNTER — Encounter (HOSPITAL_COMMUNITY): Payer: Self-pay

## 2015-09-24 ENCOUNTER — Ambulatory Visit (HOSPITAL_COMMUNITY): Payer: BLUE CROSS/BLUE SHIELD

## 2015-09-24 DIAGNOSIS — M25612 Stiffness of left shoulder, not elsewhere classified: Secondary | ICD-10-CM | POA: Diagnosis not present

## 2015-09-24 DIAGNOSIS — R29898 Other symptoms and signs involving the musculoskeletal system: Secondary | ICD-10-CM

## 2015-09-24 DIAGNOSIS — M25512 Pain in left shoulder: Secondary | ICD-10-CM

## 2015-09-24 NOTE — Therapy (Signed)
[facility] [address] [place], [place], [address] Phone: [phone]   Fax:  [phone]  Occupational Therapy Treatment  Patient Details  Name: Vernon Fisher MRN: [id number] Date of Birth: 1956/01/11 Referring Provider: Fuller CanadaStanley Harrison, MD  Encounter Date: 09/24/2015      OT End of Session - 09/24/15 0839    Visit Number 4   Number of Visits 18   Date for OT Re-Evaluation 11/08/15   Authorization Type BCBS select   Authorization Time Period 30 visit limit 07/06/15-07/04/16   Authorization - Visit Number 4   Authorization - Number of Visits 30   OT Start Time 0805   OT Stop Time 0850   OT Time Calculation (min) 45 min   Activity Tolerance Patient tolerated treatment well   Behavior During Therapy [facility] for tasks assessed/performed      Past Medical History  Diagnosis Date  . GERD (gastroesophageal reflux disease)   . Hypertension   . Diabetes mellitus without complication (HCC)   . Migraine headache   . Degenerative disc disease   . Chronic pain   . Fibromyalgia   . Reflux   . Plantar fasciitis   . PONV (postoperative nausea and vomiting)   . Depression     Past Surgical History  Procedure Laterality Date  . Bilateral carpal tunnel release    . Right rotator cuff repair    . Nose surgery    . Colonoscopy  06/08/2011    Procedure: COLONOSCOPY;  Surgeon: Arlyce HarmanSandi M Fields, MD;  [facility];  Service: Endoscopy;  Laterality: N/A;  11:15 AM  . Knee arthroscopy with medial menisectomy Left 09/06/2014    Procedure: KNEE ARTHROSCOPY WITH LIMITED DEBRIDEMENT;  Surgeon: Vickki HearingStanley E Harrison, MD;  Location: [facility];  Service: Orthopedics;  Laterality: Left;    There were no vitals filed for this visit.  Visit Diagnosis:  Shoulder stiffness, left  Shoulder pain, left  Shoulder weakness      Subjective Assessment - 09/24/15 0810    Subjective  I'm in pain every day.   Currently in Pain? Yes   Pain Score 5    Pain Location Generalized   Pain Descriptors / Indicators Aching;Constant   Pain Type Chronic pain   Pain Onset More than a month ago   Pain Frequency Constant   Aggravating Factors  Moving   Pain Relieving Factors Nothing makes it go away.   Effect of Pain on Daily Activities My entire life.   Multiple Pain Sites Yes            [facility] - 09/24/15 0812    Assessment   Diagnosis left rotator cuff syndrome   Precautions   Precautions None                  OT Treatments/Exercises (OP) - 09/24/15 0813    Exercises   Exercises Neck;Shoulder   Neck Exercises: Seated   Cervical Rotation Both;5 reps   Cervical Rotation Limitations L rotation 50% restircted   Lateral Flexion Both;5 reps   Lateral Flexion Limitations R lateral flexion 50% restricted   Neck Exercises: Supine   Cervical Rotation Both;5 reps   Cervical Rotation Limitations Left rotation 50% restircted   Lateral Flexion Both;5 reps   Lateral Flexion Limitations Right lateral flexion 50% restricted   Shoulder Exercises: Supine   Protraction PROM;5 reps   Horizontal ABduction PROM;5 reps   External Rotation PROM;5 reps   Internal Rotation PROM;5  reps   Flexion PROM;5 reps   ABduction PROM;5 reps   Shoulder Exercises: ROM/Strengthening   "W" Arms 10X   X to V Arms 10X   Modalities   Modalities Electrical Stimulation;Moist Heat   Moist Heat Therapy   Number Minutes Moist Heat 10 Minutes   Moist Heat Location Cervical;Shoulder   Electrical Stimulation   Electrical Stimulation Location Left trapezius    Electrical Stimulation Action Interferential   Electrical Stimulation Parameters 6.0CV   Electrical Stimulation Goals Pain   Manual Therapy   Manual Therapy Myofascial release;Other (comment)   Manual therapy comments manual therapy completed prior to exercises   Myofascial Release Myofascial release to left upper arm, deltoid, and trapezius regions to decrease pain and fascial restrictions  and pain, and increase joint range of motion   Other Manual Therapy Positional release completed to left upper trapezius to reduce trigger point pain and relax muscle.                  OT Short Term Goals - 09/22/15 1259    OT SHORT TERM GOAL #1   Title Patient will be educated and independent with HEP to increase functional use of LUE and cervical ROM during daily tasks.    Time 3   Period Weeks   Status On-going   OT SHORT TERM GOAL #2   Title Patient will decrease pain level in LUE and next to 3/10 or less during daily tasks.    Time 3   Period Weeks   Status On-going   OT SHORT TERM GOAL #3   Title Patient will increase A/ROM of LUE to [facility] to increase ability to complete activities above head level.    Time 3   Period Weeks   Status On-going   OT SHORT TERM GOAL #4   Title Patient will increase strength in LUE shoulder suring external and internal rotation  to 5/5 to increase ability to complete activities out away from body.    Time 3   Period Weeks   Status On-going   OT SHORT TERM GOAL #5   Title Patient will decrease fascial restrictions to mod amount to increase functional mobility and decrease pain level in LUE.   Time 3   Period Weeks   Status On-going           OT Long Term Goals - 09/22/15 1259    OT LONG TERM GOAL #1   Title Patient will return to highest level of independence with all daily tasks using LUE.    Time 6   Period Weeks   Status On-going   OT LONG TERM GOAL #2   Title Patient will increase scapular strength and mobility to increase overall posture and decrease pain level when completing daily tasks.   Time 6   Period Weeks   Status On-going   OT LONG TERM GOAL #3   Title Patient willl increase cervical A/ROM during rotation left and right to [facility] to increase ability to look both ways at traffic when driving.    Time 6   Period Weeks   Status On-going   OT LONG TERM GOAL #4   Title Patient will decrease fascial restrictions to  min amount in left UE and cervical region.    Time 6   Period Weeks   Status On-going               Plan - 09/24/15 0840    Clinical Impression Statement Therapist notes decrease in  fascial restrictions in upper trapezious from maximum to moderate following positional release technique. Patient reports his pain is about the same following ES and moist heat.   Plan P: Continue to focus on posture education when completing exercises and strengthning scapular muscles, patient responded well to verbal cues. Add scapular theraband exercises next session.        Problem List Patient Active Problem List   Diagnosis Date Noted  . Other synovitis and tenosynovitis, unspecified lower leg   . Chronic pain syndrome 06/24/2014  . Abnormal fasting glucose 02/11/2014  . Fibromyalgia 09/29/2013  . Other malaise and fatigue 01/09/2013  . Chest pain 07/17/2012  . Essential hypertension 07/17/2012  . Arthritis 07/17/2012    [name], OTR/L,CBIS  [phone]  09/24/2015, 9:14 AM  [place] [facility] [address] [place], [place], [address] Phone: [phone]   Fax:  [phone]  Name: KELON EASOM MRN: [id number] Date of Birth: 04/13/1956

## 2015-09-26 ENCOUNTER — Encounter (HOSPITAL_COMMUNITY): Payer: Self-pay

## 2015-09-26 ENCOUNTER — Ambulatory Visit (HOSPITAL_COMMUNITY): Payer: BLUE CROSS/BLUE SHIELD

## 2015-09-26 DIAGNOSIS — M25612 Stiffness of left shoulder, not elsewhere classified: Secondary | ICD-10-CM

## 2015-09-26 DIAGNOSIS — R29898 Other symptoms and signs involving the musculoskeletal system: Secondary | ICD-10-CM

## 2015-09-26 DIAGNOSIS — M25512 Pain in left shoulder: Secondary | ICD-10-CM

## 2015-09-26 NOTE — Therapy (Signed)
[facility] [address] [place], [place], [address] Phone: [phone]   Fax:  [phone]  Occupational Therapy Treatment  Patient Details  Name: Vernon Fisher MRN: [id number] Date of Birth: Dec 06, 1955 Referring Provider: Fuller CanadaStanley Harrison, MD  Encounter Date: 09/26/2015      OT End of Session - 09/26/15 0927    Visit Number 5   Number of Visits 18   Date for OT Re-Evaluation 11/08/15   Authorization Type BCBS select   Authorization Time Period 30 visit limit 07/06/15-07/04/16   Authorization - Visit Number 5   Authorization - Number of Visits 30   OT Start Time 0855   OT Stop Time 0933   OT Time Calculation (min) 38 min   Activity Tolerance Patient tolerated treatment well   Behavior During Therapy [facility] for tasks assessed/performed      Past Medical History  Diagnosis Date  . GERD (gastroesophageal reflux disease)   . Hypertension   . Diabetes mellitus without complication (HCC)   . Migraine headache   . Degenerative disc disease   . Chronic pain   . Fibromyalgia   . Reflux   . Plantar fasciitis   . PONV (postoperative nausea and vomiting)   . Depression     Past Surgical History  Procedure Laterality Date  . Bilateral carpal tunnel release    . Right rotator cuff repair    . Nose surgery    . Colonoscopy  06/08/2011    Procedure: COLONOSCOPY;  Surgeon: Arlyce HarmanSandi M Fields, MD;  [facility];  Service: Endoscopy;  Laterality: N/A;  11:15 AM  . Knee arthroscopy with medial menisectomy Left 09/06/2014    Procedure: KNEE ARTHROSCOPY WITH LIMITED DEBRIDEMENT;  Surgeon: Vickki HearingStanley E Harrison, MD;  Location: [facility] ORS;  Service: Orthopedics;  Laterality: Left;    There were no vitals filed for this visit.  Visit Diagnosis:  Shoulder stiffness, left  Shoulder pain, left  Shoulder weakness      Subjective Assessment - 09/26/15 0858    Subjective  I just did too much yesterday.   Currently in Pain? Yes   Pain Score  5    Pain Location Neck   Pain Descriptors / Indicators Aching;Constant   Pain Type Chronic pain   Pain Onset More than a month ago   Pain Frequency Constant   Aggravating Factors  Doing too much   Pain Relieving Factors Nothing makes it go away.   Effect of Pain on Daily Activities Muscle spasms affecting sleep.   Multiple Pain Sites Yes   Pain Score 10   Pain Location Head            [facility] Assessment - 09/26/15 0900    Assessment   Diagnosis left rotator cuff syndrome   Precautions   Precautions None                  OT Treatments/Exercises (OP) - 09/26/15 0900    Exercises   Exercises Neck;Shoulder   Shoulder Exercises: Standing   Extension Theraband;12 reps   Theraband Level (Shoulder Extension) Level 2 (Red)   Row Theraband;12 reps   Theraband Level (Shoulder Row) Level 2 (Red)   Retraction Theraband;12 reps   Theraband Level (Shoulder Retraction) Level 2 (Red)   Modalities   Modalities Ultrasound   Ultrasound   Ultrasound Location Left Upper trapezius   Ultrasound Parameters 1.5 watts   Ultrasound Goals Pain   Manual Therapy   Manual Therapy Myofascial  release;Other (comment)   Manual therapy comments manual therapy completed prior to exercises   Myofascial Release Myofascial release to left upper arm, deltoid, and trapezius regions to decrease pain and fascial restrictions and pain, and increase joint range of motion   Other Manual Therapy --                OT Education - 09/26/15 0931    Education provided Yes   Education Details Theraband HEP exercises   Person(s) Educated Patient   Methods Explanation;Demonstration;Tactile cues;Handout   Comprehension Verbalized understanding;Returned demonstration;Verbal cues required          OT Short Term Goals - 09/22/15 1259    OT SHORT TERM GOAL #1   Title Patient will be educated and independent with HEP to increase functional use of LUE and cervical ROM during daily tasks.    Time  3   Period Weeks   Status On-going   OT SHORT TERM GOAL #2   Title Patient will decrease pain level in LUE and next to 3/10 or less during daily tasks.    Time 3   Period Weeks   Status On-going   OT SHORT TERM GOAL #3   Title Patient will increase A/ROM of LUE to [facility] to increase ability to complete activities above head level.    Time 3   Period Weeks   Status On-going   OT SHORT TERM GOAL #4   Title Patient will increase strength in LUE shoulder suring external and internal rotation  to 5/5 to increase ability to complete activities out away from body.    Time 3   Period Weeks   Status On-going   OT SHORT TERM GOAL #5   Title Patient will decrease fascial restrictions to mod amount to increase functional mobility and decrease pain level in LUE.   Time 3   Period Weeks   Status On-going           OT Long Term Goals - 09/22/15 1259    OT LONG TERM GOAL #1   Title Patient will return to highest level of independence with all daily tasks using LUE.    Time 6   Period Weeks   Status On-going   OT LONG TERM GOAL #2   Title Patient will increase scapular strength and mobility to increase overall posture and decrease pain level when completing daily tasks.   Time 6   Period Weeks   Status On-going   OT LONG TERM GOAL #3   Title Patient willl increase cervical A/ROM during rotation left and right to [facility] to increase ability to look both ways at traffic when driving.    Time 6   Period Weeks   Status On-going   OT LONG TERM GOAL #4   Title Patient will decrease fascial restrictions to min amount in left UE and cervical region.    Time 6   Period Weeks   Status On-going               Plan - 09/26/15 1610    Clinical Impression Statement A: Provided patient with theraband exercises for HEP. Added ultrasound this session to decrease pain in upper trapezious. Therapist notes increased fascial restrictions in bilateral upper trapezious this session.   Plan P: Continue  ultrasound next session for pain management. Follow up with effects of [place] during the day after last session.  Continue with scapular and cervical exercises. Complete manual therapy to bilateral upper trapezius muscles if needed.  Problem List Patient Active Problem List   Diagnosis Date Noted  . Other synovitis and tenosynovitis, unspecified lower leg   . Chronic pain syndrome 06/24/2014  . Abnormal fasting glucose 02/11/2014  . Fibromyalgia 09/29/2013  . Other malaise and fatigue 01/09/2013  . Chest pain 07/17/2012  . Essential hypertension 07/17/2012  . Arthritis 07/17/2012    Vernon Fisher, OTR/L,CBIS  [phone]  09/26/2015, 11:31 AM  [facility] [address] [place], [place], [address] Phone: [phone]   Fax:  [phone]  Name: CREW GOREN MRN: [id number] Date of Birth: 28-May-1956

## 2015-09-26 NOTE — Patient Instructions (Signed)

## 2015-09-29 ENCOUNTER — Ambulatory Visit (HOSPITAL_COMMUNITY): Payer: BLUE CROSS/BLUE SHIELD | Admitting: Specialist

## 2015-09-29 DIAGNOSIS — M25512 Pain in left shoulder: Secondary | ICD-10-CM

## 2015-09-29 DIAGNOSIS — M25612 Stiffness of left shoulder, not elsewhere classified: Secondary | ICD-10-CM

## 2015-09-29 DIAGNOSIS — R29898 Other symptoms and signs involving the musculoskeletal system: Secondary | ICD-10-CM

## 2015-09-29 DIAGNOSIS — M75102 Unspecified rotator cuff tear or rupture of left shoulder, not specified as traumatic: Secondary | ICD-10-CM

## 2015-09-29 NOTE — Therapy (Signed)
[place] [facility] [address] [place], [place], [address] Phone: [phone]   Fax:  [phone]  Occupational Therapy Treatment  Patient Details  Name: Vernon KetoRobert L Nordin MRN: [id number] Date of Birth: 03/19/1956 Referring Provider: Fuller CanadaStanley Harrison, MD  Encounter Date: 09/29/2015      OT End of Session - 09/29/15 1306    Visit Number 6   Number of Visits 18   Date for OT Re-Evaluation 11/08/15   Authorization Type BCBS select   Authorization Time Period 30 visit limit 07/06/15-07/04/16   Authorization - Visit Number 6   Authorization - Number of Visits 30   OT Start Time 0854   OT Stop Time 0933   OT Time Calculation (min) 39 min   Activity Tolerance Patient tolerated treatment well   Behavior During Therapy [facility] for tasks assessed/performed      Past Medical History  Diagnosis Date  . GERD (gastroesophageal reflux disease)   . Hypertension   . Diabetes mellitus without complication (HCC)   . Migraine headache   . Degenerative disc disease   . Chronic pain   . Fibromyalgia   . Reflux   . Plantar fasciitis   . PONV (postoperative nausea and vomiting)   . Depression     Past Surgical History  Procedure Laterality Date  . Bilateral carpal tunnel release    . Right rotator cuff repair    . Nose surgery    . Colonoscopy  06/08/2011    Procedure: COLONOSCOPY;  Surgeon: Arlyce HarmanSandi M Fields, MD;  [facility];  Service: Endoscopy;  Laterality: N/A;  11:15 AM  . Knee arthroscopy with medial menisectomy Left 09/06/2014    Procedure: KNEE ARTHROSCOPY WITH LIMITED DEBRIDEMENT;  Surgeon: Vickki HearingStanley E Harrison, MD;  Location: [facility];  Service: Orthopedics;  Laterality: Left;    There were no vitals filed for this visit.  Visit Diagnosis:  Shoulder stiffness, left  Shoulder pain, left  Shoulder weakness  Rotator cuff syndrome of left shoulder      Subjective Assessment - 09/29/15 0855    Subjective  S:  I dont think the  ultrasound helped very much.  I couldnt tell a difference   Currently in Pain? Yes   Pain Score 5    Pain Location Shoulder   Pain Orientation Left   Pain Descriptors / Indicators Aching;Constant   Pain Onset More than a month ago            [facility] - 09/29/15 0001    Assessment   Diagnosis left rotator cuff syndrome   Precautions   Precautions None   Restrictions   Weight Bearing Restrictions No                  OT Treatments/Exercises (OP) - 09/29/15 0001    Exercises   Exercises Neck;Shoulder   Shoulder Exercises: Supine   Protraction PROM;5 reps   Horizontal ABduction PROM;5 reps   External Rotation PROM;5 reps   Internal Rotation PROM;5 reps   Flexion PROM;5 reps   ABduction PROM;5 reps   Shoulder Exercises: Standing   External Rotation Theraband;15 reps   Theraband Level (Shoulder External Rotation) Level 2 (Red)   Internal Rotation Theraband;10 reps   Theraband Level (Shoulder Internal Rotation) Level 2 (Red)   Extension Theraband;15 reps   Theraband Level (Shoulder Extension) Level 2 (Red)   Row Theraband;15 reps   Theraband Level (Shoulder Row) Level 2 (Red)   Retraction Theraband;15 reps  Theraband Level (Shoulder Retraction) Level 2 (Red)   Shoulder Exercises: ROM/Strengthening   UBE (Upper Arm Bike) 3 minutes in reverse at 1.0   "W" Arms 10X   X to V Arms 10X   Manual Therapy   Manual Therapy Myofascial release   Manual therapy comments manual therapy completed prior to exercises   Myofascial Release Myofascial release to left upper arm, deltoid, and trapezius regions to decrease pain and fascial restrictions and pain, and increase joint range of motion  MFR to bilateral cervical regions to decrease pain and restrictions and improve pain free mobility                  OT Short Term Goals - 09/22/15 1259    OT SHORT TERM GOAL #1   Title Patient will be educated and independent with HEP to increase functional use of  LUE and cervical ROM during daily tasks.    Time 3   Period Weeks   Status On-going   OT SHORT TERM GOAL #2   Title Patient will decrease pain level in LUE and next to 3/10 or less during daily tasks.    Time 3   Period Weeks   Status On-going   OT SHORT TERM GOAL #3   Title Patient will increase A/ROM of LUE to [facility] to increase ability to complete activities above head level.    Time 3   Period Weeks   Status On-going   OT SHORT TERM GOAL #4   Title Patient will increase strength in LUE shoulder suring external and internal rotation  to 5/5 to increase ability to complete activities out away from body.    Time 3   Period Weeks   Status On-going   OT SHORT TERM GOAL #5   Title Patient will decrease fascial restrictions to mod amount to increase functional mobility and decrease pain level in LUE.   Time 3   Period Weeks   Status On-going           OT Long Term Goals - 09/22/15 1259    OT LONG TERM GOAL #1   Title Patient will return to highest level of independence with all daily tasks using LUE.    Time 6   Period Weeks   Status On-going   OT LONG TERM GOAL #2   Title Patient will increase scapular strength and mobility to increase overall posture and decrease pain level when completing daily tasks.   Time 6   Period Weeks   Status On-going   OT LONG TERM GOAL #3   Title Patient willl increase cervical A/ROM during rotation left and right to [facility] to increase ability to look both ways at traffic when driving.    Time 6   Period Weeks   Status On-going   OT LONG TERM GOAL #4   Title Patient will decrease fascial restrictions to min amount in left UE and cervical region.    Time 6   Period Weeks   Status On-going               Plan - 09/29/15 1306    Clinical Impression Statement A:  Patient states that every activity he completes causes pain in his left shoulder region.  He did not have relief from pain with ultrasound, therefore did not complete again this  session.  A/ROM is [facility], as is P/ROM, however pain level remains high.     Plan P:  focus on scapular stability and posture exercises  to improve aligment and biomechanics of left shoulder movement.  Attempt A/ROM in prone.          Problem List Patient Active Problem List   Diagnosis Date Noted  . Other synovitis and tenosynovitis, unspecified lower leg   . Chronic pain syndrome 06/24/2014  . Abnormal fasting glucose 02/11/2014  . Fibromyalgia 09/29/2013  . Other malaise and fatigue 01/09/2013  . Chest pain 07/17/2012  . Essential hypertension 07/17/2012  . Arthritis 07/17/2012    Shirlean Mylar, OTR/L [phone]  09/29/2015, 1:10 PM   [facility] [address] [place], [place], [address] Phone: [phone]   Fax:  [phone]  Name: EZARIAH NACE MRN: [id number] Date of Birth: [birth year]

## 2015-10-01 ENCOUNTER — Encounter (HOSPITAL_COMMUNITY): Payer: Self-pay

## 2015-10-01 ENCOUNTER — Ambulatory Visit (HOSPITAL_COMMUNITY): Payer: BLUE CROSS/BLUE SHIELD

## 2015-10-01 DIAGNOSIS — M25612 Stiffness of left shoulder, not elsewhere classified: Secondary | ICD-10-CM

## 2015-10-01 DIAGNOSIS — M25512 Pain in left shoulder: Secondary | ICD-10-CM

## 2015-10-01 DIAGNOSIS — R29898 Other symptoms and signs involving the musculoskeletal system: Secondary | ICD-10-CM

## 2015-10-01 NOTE — Therapy (Signed)
[facility] [address] [place], [place], [address] Phone: [phone]   Fax:  [phone]  Occupational Therapy Treatment  Patient Details  Name: Vernon Fisher MRN: [id number] Date of Birth: [birth year] Referring Provider: Fuller Canada, MD  Encounter Date: 10/01/2015      OT End of Session - 10/01/15 0847    Visit Number 7   Number of Visits 18   Date for OT Re-Evaluation 11/08/15   Authorization Type BCBS select   Authorization Time Period 30 visit limit 07/06/15-07/04/16   Authorization - Visit Number 7   Authorization - Number of Visits 30   OT Start Time 0805   OT Stop Time 0845   OT Time Calculation (min) 40 min   Activity Tolerance Patient tolerated treatment well   Behavior During Therapy [facility] for tasks assessed/performed      Past Medical History  Diagnosis Date  . GERD (gastroesophageal reflux disease)   . Hypertension   . Diabetes mellitus without complication (HCC)   . Migraine headache   . Degenerative disc disease   . Chronic pain   . Fibromyalgia   . Reflux   . Plantar fasciitis   . PONV (postoperative nausea and vomiting)   . Depression     Past Surgical History  Procedure Laterality Date  . Bilateral carpal tunnel release    . Right rotator cuff repair    . Nose surgery    . Colonoscopy  06/08/2011    Procedure: COLONOSCOPY;  Surgeon: [name], MD;  [facility];  Service: Endoscopy;  Laterality: N/A;  11:15 AM  . Knee arthroscopy with medial menisectomy Left 09/06/2014    Procedure: KNEE ARTHROSCOPY WITH LIMITED DEBRIDEMENT;  Surgeon: Vickki Hearing, MD;  Location: [facility];  Service: Orthopedics;  Laterality: Left;    There were no vitals filed for this visit.  Visit Diagnosis:  Shoulder stiffness, left  Shoulder pain, left  Shoulder weakness      Subjective Assessment - 10/01/15 0810    Subjective  It's the same as usual.   Currently in Pain? Yes   Pain Score 5    Pain Location Neck   Pain Descriptors / Indicators Constant            [facility] OT Assessment - 10/01/15 0812    Assessment   Diagnosis left rotator cuff syndrome   Precautions   Precautions None                  OT Treatments/Exercises (OP) - 10/01/15 0812    Exercises   Exercises Neck;Shoulder   Shoulder Exercises: Supine   Protraction PROM;5 reps   Horizontal ABduction PROM;5 reps   External Rotation PROM;5 reps   Internal Rotation PROM;5 reps   Flexion PROM;5 reps   ABduction PROM;5 reps   Shoulder Exercises: Prone   Flexion AROM;10 reps;Left   Extension AROM;10 reps;Left   External Rotation AROM;10 reps;Left   Internal Rotation AROM;10 reps;Left   Horizontal ABduction 1 AROM;10 reps;Left   Horizontal ABduction 2 AROM;10 reps;Left   Shoulder Exercises: Standing   External Rotation Theraband;15 reps   Theraband Level (Shoulder External Rotation) Level 2 (Red)   Internal Rotation Theraband;15 reps   Theraband Level (Shoulder Internal Rotation) Level 2 (Red)   Extension Theraband;10 reps   Theraband Level (Shoulder Extension) Level 2 (Red)   Row --   Theraband Level (Shoulder Row) --   Retraction --   Theraband Level (Shoulder Retraction) --  Other Standing Exercises Horizonal abduction 10X with red theraband   Shoulder Exercises: ROM/Strengthening   UBE (Upper Arm Bike) 3 minutes in reverse at 1.0   "W" Arms 10X in prone, 12X seated   X to V Arms 12X seated   Manual Therapy   Manual Therapy Myofascial release   Manual therapy comments manual therapy completed prior to exercises   Myofascial Release Myofascial release to left upper arm, deltoid, and trapezius regions to decrease pain and fascial restrictions and pain, and increase joint range of motion  MFR to bilateral cervical regions to decrease pain and restrictions and improve pain free mobility                  OT Short Term Goals - 09/22/15 1259    OT SHORT TERM GOAL #1   Title  Patient will be educated and independent with HEP to increase functional use of LUE and cervical ROM during daily tasks.    Time 3   Period Weeks   Status On-going   OT SHORT TERM GOAL #2   Title Patient will decrease pain level in LUE and next to 3/10 or less during daily tasks.    Time 3   Period Weeks   Status On-going   OT SHORT TERM GOAL #3   Title Patient will increase A[facility]ROM of [facility] to [facility] to increase ability to complete activities above head level.    Time 3   Period Weeks   Status On-going   OT SHORT TERM GOAL #4   Title Patient will increase strength in LUE shoulder suring external and internal rotation  to 5/5 to increase ability to complete activities out away from body.    Time 3   Period Weeks   Status On-going   OT SHORT TERM GOAL #5   Title Patient will decrease fascial restrictions to mod amount to increase functional mobility and decrease pain level in LUE.   Time 3   Period Weeks   Status On-going           OT Long Term Goals - 09/22/15 1259    OT LONG TERM GOAL #1   Title Patient will return to highest level of independence with all daily tasks using LUE.    Time 6   Period Weeks   Status On-going   OT LONG TERM GOAL #2   Title Patient will increase scapular strength and mobility to increase overall posture and decrease pain level when completing daily tasks.   Time 6   Period Weeks   Status On-going   OT LONG TERM GOAL #3   Title Patient willl increase cervical A/ROM during rotation left and right to [facility] to increase ability to look both ways at traffic when driving.    Time 6   Period Weeks   Status On-going   OT LONG TERM GOAL #4   Title Patient will decrease fascial restrictions to min amount in left UE and cervical region.    Time 6   Period Weeks   Status On-going               Plan - 10/01/15 0848    Clinical Impression Statement A: Patient states that he is in pain with all activities and he does not use his left arm if he can  avoid it. Added exercises in prone and increased reps of X to V arms to 15. Therapist provided verbal and visual cues for form during Theraband exercises this session. Patient  complains of pain during PNF exercise with Theraband, therapist discontinues exercise.    Plan P: Continue with exercises added today. Add ball on the wall and Cybex row.        Problem List Patient Active Problem List   Diagnosis Date Noted  . Other synovitis and tenosynovitis, unspecified lower leg   . Chronic pain syndrome 06/24/2014  . Abnormal fasting glucose 02/11/2014  . Fibromyalgia 09/29/2013  . Other malaise and fatigue 01/09/2013  . Chest pain 07/17/2012  . Essential hypertension 07/17/2012  . Arthritis 07/17/2012    [name], OTR/L,CBIS  [phone]  10/01/2015, 8:57 AM  [facility] [address] [place], [place], [address] Phone: [phone]   Fax:  [phone]  Name: Vernon Fisher MRN: [id number] Date of Birth: 1956/02/12

## 2015-10-03 ENCOUNTER — Ambulatory Visit (HOSPITAL_COMMUNITY): Payer: BLUE CROSS/BLUE SHIELD

## 2015-10-03 ENCOUNTER — Encounter (HOSPITAL_COMMUNITY): Payer: Self-pay

## 2015-10-03 DIAGNOSIS — M25612 Stiffness of left shoulder, not elsewhere classified: Secondary | ICD-10-CM | POA: Diagnosis not present

## 2015-10-03 DIAGNOSIS — R29898 Other symptoms and signs involving the musculoskeletal system: Secondary | ICD-10-CM

## 2015-10-03 DIAGNOSIS — M25512 Pain in left shoulder: Secondary | ICD-10-CM

## 2015-10-03 NOTE — Therapy (Signed)
[facility] [address] [place], [place], [address] Phone: [phone]   Fax:  [phone]  Occupational Therapy Treatment  Patient Details  Name: Vernon Fisher MRN: [id number] Date of Birth: 1955-11-23 Referring Provider: Fuller Canada, MD  Encounter Date: 10/03/2015      OT End of Session - 10/03/15 0927    Visit Number 8   Number of Visits 18   Date for OT Re-Evaluation 11/08/15   Authorization Type BC[employer] select   Authorization Time Period 30 visit limit 07/06/15-07/04/16   Authorization - Visit Number 8   Authorization - Number of Visits 30   OT Start Time 0850  Pt was late to session   OT Stop Time 0935   OT Time Calculation (min) 45 min   Activity Tolerance Patient tolerated treatment well   Behavior During Therapy [facility] for tasks assessed/performed      Past Medical History  Diagnosis Date  . GERD (gastroesophageal reflux disease)   . Hypertension   . Diabetes mellitus without complication (HCC)   . Migraine headache   . Degenerative disc disease   . Chronic pain   . Fibromyalgia   . Reflux   . Plantar fasciitis   . PONV (postoperative nausea and vomiting)   . Depression     Past Surgical History  Procedure Laterality Date  . Bilateral carpal tunnel release    . Right rotator cuff repair    . Nose surgery    . Colonoscopy  06/08/2011    Procedure: COLONOSCOPY;  Surgeon: [name], MD;  Location: [facility];  Service: Endoscopy;  Laterality: N/A;  11:15 AM  . Knee arthroscopy with medial menisectomy Left 09/06/2014    Procedure: KNEE ARTHROSCOPY WITH LIMITED DEBRIDEMENT;  Surgeon: Vickki Hearing, MD;  Location: [facility] OR[facility];  Service: Orthopedics;  Laterality: Left;    There were no vitals filed for this visit.  Visit Diagnosis:  Shoulder stiffness, left  Shoulder pain, left  Shoulder weakness      Subjective Assessment - 10/03/15 0851    Subjective  S: Same as always.   Currently in Pain? Yes    Pain Score 5    Pain Location Neck   Pain Orientation Left   Pain Descriptors / Indicators Constant   Pain Type Chronic pain   Pain Onset More than a month ago   Pain Frequency Constant   Aggravating Factors  Movement, weather   Pain Relieving Factors Nothing   Effect of Pain on Daily Activities Affects everything   Multiple Pain Sites No            [facility] OT Assessment - 10/03/15 0852    Assessment   Diagnosis left rotator cuff syndrome   Precautions   Precautions None                  OT Treatments/Exercises (OP) - 10/03/15 0852    Exercises   Exercises Neck;Shoulder   Shoulder Exercises: Supine   Protraction PROM;10 reps;5 reps   Horizontal ABduction PROM;5 reps   External Rotation PROM;5 reps   Internal Rotation PROM;5 reps   Flexion PROM;AROM;5 reps  Therapist used ball to massage subscapularis muscle in AROM    ABduction PROM;5 reps   Shoulder Exercises: Prone   Flexion AROM;10 reps;Left   Extension AROM;10 reps;Left   External Rotation --   Internal Rotation --   Horizontal ABduction 1 AROM;10 reps;Left   Horizontal ABduction 2 --   Shoulder Exercises:  Standing   Shoulder Elevation AROM;10 reps   Shoulder Exercises: ROM/Strengthening   "W" Arms 12X in standing   Modalities   Modalities Moist Heat   Moist Heat Therapy   Number Minutes Moist Heat 10 Minutes   Moist Heat Location Lumbar Spine;Cervical;Shoulder  anterior for all    Manual Therapy   Manual Therapy Myofascial release   Manual therapy comments manual therapy completed prior to exercises   Myofascial Release Myofascial release to left upper arm, deltoid, and trapezius regions to decrease pain and fascial restrictions and pain, and increase joint range of motion                    OT Short Term Goals - 09/22/15 1259    OT SHORT TERM GOAL #1   Title Patient will be educated and independent with HEP to increase functional use of LUE and cervical ROM during daily tasks.     Time 3   Period Weeks   Status On-going   OT SHORT TERM GOAL #2   Title Patient will decrease pain level in LUE and next to 3/10 or less during daily tasks.    Time 3   Period Weeks   Status On-going   OT SHORT TERM GOAL #3   Title Patient will increase A/ROM of LUE to [facility] to increase ability to complete activities above head level.    Time 3   Period Weeks   Status On-going   OT SHORT TERM GOAL #4   Title Patient will increase strength in LUE shoulder suring external and internal rotation  to 5/5 to increase ability to complete activities out away from body.    Time 3   Period Weeks   Status On-going   OT SHORT TERM GOAL #5   Title Patient will decrease fascial restrictions to mod amount to increase functional mobility and decrease pain level in LUE.   Time 3   Period Weeks   Status On-going           OT Long Term Goals - 09/22/15 1259    OT LONG TERM GOAL #1   Title Patient will return to highest level of independence with all daily tasks using LUE.    Time 6   Period Weeks   Status On-going   OT LONG TERM GOAL #2   Title Patient will increase scapular strength and mobility to increase overall posture and decrease pain level when completing daily tasks.   Time 6   Period Weeks   Status On-going   OT LONG TERM GOAL #3   Title Patient willl increase cervical A/ROM during rotation left and right to [facility] to increase ability to look both ways at traffic when driving.    Time 6   Period Weeks   Status On-going   OT LONG TERM GOAL #4   Title Patient will decrease fascial restrictions to min amount in left UE and cervical region.    Time 6   Period Weeks   Status On-going               Plan - 10/03/15 0929    Clinical Impression Statement A: Patient complains of pain during exercises in prone: horizontal abduction with shoulder externally rotated, internal rotation, external rotation and states he feels painful pulling in his back and left shoulder. Following  exercises in prone, patient stands up and begins to complain of increased tightness in shoulders, tingling in arms and fingers, and muscle spasms in back  and pectoral muscles.   Plan P: Reassess next session, send measurements to doctor and hold therapy until follow up with MD on 10/07/15.        Problem List Patient Active Problem List   Diagnosis Date Noted  . Other synovitis and tenosynovitis, unspecified lower leg   . Chronic pain syndrome 06/24/2014  . Abnormal fasting glucose 02/11/2014  . Fibromyalgia 09/29/2013  . Other malaise and fatigue 01/09/2013  . Chest pain 07/17/2012  . Essential hypertension 07/17/2012  . Arthritis 07/17/2012   Limmie PatriciaLaura Essenmacher, OTR/L,CBIS  [phone]  10/03/2015, 9:35 AM  [facility] [address] [place], [place], [address] Phone: [phone]   Fax:  [phone]  Name: Vernon Fisher MRN: [id number] Date of Birth: 1955/09/06

## 2015-10-06 ENCOUNTER — Encounter (HOSPITAL_COMMUNITY): Payer: Self-pay

## 2015-10-06 ENCOUNTER — Ambulatory Visit (HOSPITAL_COMMUNITY): Payer: BLUE CROSS/BLUE SHIELD | Attending: Orthopedic Surgery

## 2015-10-06 DIAGNOSIS — M25612 Stiffness of left shoulder, not elsewhere classified: Secondary | ICD-10-CM | POA: Diagnosis not present

## 2015-10-06 DIAGNOSIS — M25512 Pain in left shoulder: Secondary | ICD-10-CM | POA: Diagnosis not present

## 2015-10-06 DIAGNOSIS — R29898 Other symptoms and signs involving the musculoskeletal system: Secondary | ICD-10-CM | POA: Diagnosis not present

## 2015-10-06 NOTE — Therapy (Addendum)
[place], [place], [address] Phone: [phone]   Fax:  [phone]  Occupational Therapy Treatment And mini reassessment Patient Details  Name: Vernon Fisher MRN: [id number] Date of Birth: 1955-12-25 Referring Provider: Arther Abbott, MD  Encounter Date: 10/06/2015      OT End of Session - 10/06/15 1019    Visit Number 9   Number of Visits 18   Date for OT Re-Evaluation 11/08/15   Authorization Type [employer] select   Authorization Time Period 30 visit limit 07/06/15-07/04/16   Authorization - Visit Number 9   Authorization - Number of Visits 30   OT Start Time 0850  mini reassessment   OT Stop Time 0930   OT Time Calculation (min) 40 min   Activity Tolerance Patient tolerated treatment well   Behavior During Therapy [facility] for tasks assessed/performed      Past Medical History  Diagnosis Date  . GERD (gastroesophageal reflux disease)   . Hypertension   . Diabetes mellitus without complication ([place])   . Migraine headache   . Degenerative disc disease   . Chronic pain   . Fibromyalgia   . Reflux   . Plantar fasciitis   . PONV (postoperative nausea and vomiting)   . Depression     Past Surgical History  Procedure Laterality Date  . Bilateral carpal tunnel release    . Right rotator cuff repair    . Nose surgery    . Colonoscopy  06/08/2011    Procedure: COLONOSCOPY;  Surgeon: Dorothyann Peng, MD;  [facility];  Service: Endoscopy;  Laterality: N/A;  11:15 AM  . Knee arthroscopy with medial menisectomy Left 09/06/2014    Procedure: KNEE ARTHROSCOPY WITH LIMITED DEBRIDEMENT;  Surgeon: Carole Civil, MD;  Location: [facility];  Service: Orthopedics;  Laterality: Left;    There were no vitals filed for this visit.  Visit Diagnosis:  Shoulder stiffness, left  Shoulder pain, left  Shoulder weakness      Subjective Assessment - 10/06/15 0851    Subjective  S: My neck is getting stiffer  and stiffer. I keep doing the stretches but it's not working.   Special Tests FOTO score: 61/100   Currently in Pain? Yes   Pain Score 5    Pain Location Neck   Pain Orientation Upper   Pain Descriptors / Indicators Constant   Pain Type Chronic pain   Pain Onset More than a month ago   Pain Frequency Constant            [facility] OT Assessment - 10/06/15 0850    Assessment   Diagnosis left rotator cuff syndrome   Precautions   Precautions None   AROM   Overall AROM Comments Assessed seated. IR/er abducted.    AROM Assessment Site Shoulder   Right/Left Shoulder Left   Left Shoulder Flexion 154 Degrees  Previous 135   Left Shoulder ABduction 125 Degrees  previous 102   Left Shoulder Internal Rotation 65 Degrees  previous 75   Left Shoulder External Rotation 70 Degrees  previous 73   Cervical Flexion 55  previous 75   Cervical Extension 40  previous 46   Cervical - Right Side Bend 30  previous 40   Cervical - Left Side Bend 17  previous 40   Cervical - Right Rotation 60  previous 45   Cervical - Left Rotation 38  previous 45   PROM   Overall PROM  Within  functional limits for tasks performed   PROM Assessment Site Shoulder   Right/Left Shoulder Left   Strength   Overall Strength Comments Assessed seated. IR/er abducted   Strength Assessment Site Shoulder   Right/Left Shoulder Left   Left Shoulder Flexion 4-/5  previous 5/5   Left Shoulder ABduction 4-/5  previous 5/5   Left Shoulder Internal Rotation 5/5  previous 4+/5   Left Shoulder External Rotation 4+/5  previous 4-/5                  OT Treatments/Exercises (OP) - 10/06/15 0850    Exercises   Exercises Neck;Shoulder   Neck Exercises: Supine   Cervical Rotation Both;5 reps  P/ROM   Lateral Flexion Both;5 reps  P/ROM   Manual Therapy   Manual Therapy Myofascial release;Manual Traction   Manual therapy comments manual therapy completed prior to exercises   Myofascial Release Myofascial  release to bilateral scapularis and trapezius regions to decrease pain and fascial restrictions and pain, and increase joint range of motion     Manual Traction Gentle manual cervical traction completed this session.                   OT Short Term Goals - 10/06/15 0925    OT SHORT TERM GOAL #1   Title Patient will be educated and independent with HEP to increase functional use of LUE and cervical ROM during daily tasks.    Time 3   Period Weeks   Status Achieved   OT SHORT TERM GOAL #2   Title Patient will decrease pain level in LUE and next to 3/10 or less during daily tasks.    Time 3   Period Weeks   Status On-going   OT SHORT TERM GOAL #3   Title Patient will increase A/ROM of LUE to [facility] to increase ability to complete activities above head level.    Time 3   Period Weeks   Status Partially Met   OT SHORT TERM GOAL #4   Title Patient will increase strength in LUE shoulder suring external and internal rotation  to 5/5 to increase ability to complete activities out away from body.    Time 3   Period Weeks   Status On-going   OT SHORT TERM GOAL #5   Title Patient will decrease fascial restrictions to mod amount to increase functional mobility and decrease pain level in LUE.   Time 3   Period Weeks   Status On-going           OT Long Term Goals - 10/06/15 1696    OT LONG TERM GOAL #1   Title Patient will return to highest level of independence with all daily tasks using LUE.    Time 6   Period Weeks   Status On-going   OT LONG TERM GOAL #2   Title Patient will increase scapular strength and mobility to increase overall posture and decrease pain level when completing daily tasks.   Time 6   Period Weeks   Status On-going   OT LONG TERM GOAL #3   Title Patient willl increase cervical A/ROM during rotation left and right to [facility] to increase ability to look both ways at traffic when driving.    Time 6   Period Weeks   Status On-going   OT LONG TERM GOAL #4    Title Patient will decrease fascial restrictions to min amount in left UE and cervical region.    Time 6  Period Weeks   Status On-going               Plan - 10/06/15 1019    Clinical Impression Statement A: Mini reassessment completed this date. Patient has met 1 short term goal. Patient has only shown A/ROM improvement with shoulder flexion and abduction. Strength overall and the remaining shoulder and cervical A/ROM measurements have greatly decreased. Patient complains of constant pain which remains at 5/10 when arriving to treatment plan. Several modalities have been utilizied for pain management without effect including electrical stiulation, ultrasound, and moist heat.  Recently, patient has been experiencing an increase in muscle spasms in mid scapular region during exercises. Patient reports that he has not altered his daily tasks in any way and will push through the pain. At this point, I don't see that therapy has helped in any way and I recommend that we hold therapy until he sees Dr. Aline Brochure and see if he has an alternate solution.    Plan P: Hold therapy until patient has his follow up appointment with Dr. Aline Brochure on 10/07/15.         Problem List Patient Active Problem List   Diagnosis Date Noted  . Other synovitis and tenosynovitis, unspecified lower leg   . Chronic pain syndrome 06/24/2014  . Abnormal fasting glucose 02/11/2014  . Fibromyalgia 09/29/2013  . Other malaise and fatigue 01/09/2013  . Chest pain 07/17/2012  . Essential hypertension 07/17/2012  . Arthritis 07/17/2012    Ailene Ravel, OTR/L,CBIS  [phone]  10/06/2015, 10:30 AM  [place], [place], [address] Phone: [phone]   Fax:  [phone]  Name: MEKAI WILKINSON MRN: [id number] Date of Birth: [birth year]  OCCUPATIONAL THERAPY DISCHARGE SUMMARY  Visits from Start of Care: 9  Current functional level related to  goals / functional outcomes: See above   Remaining deficits: See above. Physician would like patient to undergo a MRI and stop therapy at this time.   Education / Equipment: Cervical stretches Plan: Patient agrees to discharge.  Patient goals were not met. Patient is being discharged due to the physician's request.  ?????

## 2015-10-07 ENCOUNTER — Other Ambulatory Visit: Payer: Self-pay | Admitting: Family Medicine

## 2015-10-07 ENCOUNTER — Ambulatory Visit (INDEPENDENT_AMBULATORY_CARE_PROVIDER_SITE_OTHER): Payer: BLUE CROSS/BLUE SHIELD | Admitting: Orthopedic Surgery

## 2015-10-07 ENCOUNTER — Encounter: Payer: Self-pay | Admitting: Orthopedic Surgery

## 2015-10-07 VITALS — BP 129/77 | Ht 71.0 in | Wt 218.0 lb

## 2015-10-07 DIAGNOSIS — M47812 Spondylosis without myelopathy or radiculopathy, cervical region: Secondary | ICD-10-CM | POA: Diagnosis not present

## 2015-10-07 DIAGNOSIS — M5020 Other cervical disc displacement, unspecified cervical region: Secondary | ICD-10-CM | POA: Diagnosis not present

## 2015-10-07 DIAGNOSIS — R1310 Dysphagia, unspecified: Secondary | ICD-10-CM

## 2015-10-07 DIAGNOSIS — M7542 Impingement syndrome of left shoulder: Secondary | ICD-10-CM

## 2015-10-07 MED ORDER — CELECOXIB 200 MG PO CAPS
200.0000 mg | ORAL_CAPSULE | Freq: Two times a day (BID) | ORAL | Status: DC
Start: 2015-10-07 — End: 2016-03-22

## 2015-10-07 MED ORDER — METHOCARBAMOL 500 MG PO TABS: 500.0000 mg | ORAL_TABLET | Freq: Three times a day (TID) | ORAL | Status: DC

## 2015-10-07 MED ORDER — HYDROCODONE-ACETAMINOPHEN 5-325 MG PO TABS: 1.0000 | ORAL_TABLET | Freq: Three times a day (TID) | ORAL | Status: DC | PRN

## 2015-10-07 NOTE — Progress Notes (Signed)
Patient ID: Vernon Fisher, male   DOB: 11/28/1955, 60 y.o.   MRN: [id number]  Chief Complaint  Patient presents with  . Follow-up    follow up neck pain s/p pt    HPI 60 year old male presented initially with left shoulder pain was worked up and found to have impingement syndrome he was sent for therapy did not get better and in the course of that treatment he started to noticed pain in the cervical spine, loss of motion and radicular symptoms in his left upper extremity.  He was sent for physical therapy for the neck and shoulder and did not improve. His headaches actually got worse his neck spasms increased and his pain increased as well.  Presents for reevaluation  Review of Systems  Gastrointestinal:       Dysphagia when neck is turned   Musculoskeletal:       Muscle spasms neck   Neurological: Positive for tingling, sensory change and headaches.       Gait normal      Past Medical History  Diagnosis Date  . GERD (gastroesophageal reflux disease)   . Hypertension   . Diabetes mellitus without complication ([place])   . Migraine headache   . Degenerative disc disease   . Chronic pain   . Fibromyalgia   . Reflux   . Plantar fasciitis   . PONV (postoperative nausea and vomiting)   . Depression      BP 129/77 mmHg  Ht 5' 11" (1.803 m)  Wt 218 lb (98.884 kg)  BMI 30.42 kg/m2  Physical Exam  Constitutional: He is oriented to person, place, and time. He appears well-developed and well-nourished. No distress.  Cardiovascular: Normal rate and intact distal pulses.   Neurological: He is alert and oriented to person, place, and time.  Skin: Skin is warm and dry. No rash noted. He is not diaphoretic. No erythema. No pallor.  Psychiatric: He has a normal mood and affect. His behavior is normal. Judgment and thought content normal.    Ortho Exam  Left and right shoulder exhibit normal strength in abduction and flexion. He does have some tenderness in the posterior acromion and  lateral side of the deltoid which is mild. Has full passive range of motion in both shoulders and both shoulders are stable with no weakness.  Biceps and triceps are normal strength the wrist extensors and flexors so normal strength grip strength is normal  Asymmetric reflexes at the biceps compared left to right and slight decreased sensation along the lateral portion of the arm  Lymph nodes are normal  Ambulation shows no nice diagnosis or gait disturbance.  ASSESSMENT AND PLAN   MRI 2005 At C7-T1 and T1-T2, there is no abnormality.    IMPRESSION:   1. Moderate sized broad based disc herniation at C5-6 that effaces the ventral subarachnoid space and contacts the ventral aspect of the cord.   2. Smaller disc protrusions at C6-7, C4-5, and C3-4 that indent the ventral subarachnoid space but do not completely efface it and do not appear to affect the cord or show any foraminal extension.   Provider: Annabell Sabal      Diagnostic cervical spine 2 views-RECENT  Shoulder and neck pain  Shoulder pain not resolving  X-ray show uncovertebral joint arthritis at C5 and 6 with anterior osteophyte at C5-C6 and joint space narrowing C5-C6 with loss of normal cervical contour and loss of cervical lordosis at C5-C6  Impression C5-C6 degenerative disc disease.  LAST PT NOTE Mini reassessment completed this date. Patient has met 1 short term goal. Patient has only shown A/ROM improvement with shoulder flexion and abduction. Strength overall and the remaining shoulder and cervical A/ROM measurements have greatly decreased. Patient complains of constant pain which remains at 5/10 when arriving to treatment plan. Several modalities have been utilizied for pain management without effect including electrical stiulation, ultrasound, and moist heat.  Recently, patient has been experiencing an increase in muscle spasms in mid scapular region during exercises. Patient reports that he has not altered his  daily tasks in any way and will push through the pain. At this point, I don't see that therapy has helped in any way and I recommend that we hold therapy until he sees Dr. Aline Brochure and see if he has an alternate solution.     Plan  P: Hold therapy until patient has his follow up appointment with Dr. Aline Brochure on 10/07/15.      MRI cervical spine evaluate disc C5-6 for herniation.

## 2015-10-07 NOTE — Patient Instructions (Addendum)
We will obtain pre-certification from the insurer and call you to schedule the study.   Start new medications   Pain Medicine Instructions HOW CAN PAIN MEDICINE AFFECT ME? You were given a prescription for pain medicine. This medicine may make you tired or drowsy and may affect your ability to think clearly. Pain medicine may also affect your ability to drive or perform certain physical activities. It may not be possible to make all of your pain go away, but you should be comfortable enough to move, breathe, and take care of yourself. HOW OFTEN SHOULD I TAKE PAIN MEDICINE AND HOW MUCH SHOULD I TAKE?  Take pain medicine only as directed by your health care provider and only as needed for pain.  You do not need to take pain medicine if you are not having pain, unless directed by your health care provider.  You can take less than the prescribed dose if you find that a smaller amount of medicine controls your pain. WHAT RESTRICTIONS DO I HAVE WHILE TAKING PAIN MEDICINE? Follow these instructions after you start taking pain medicine, while you are taking the medicine, and for 8 hours after you stop taking the medicine:  Do not drive.  Do not operate machinery.  Do not operate power tools.  Do not sign legal documents.  Do not drink alcohol.  Do not take sleeping pills.  Do not supervise children by yourself.  Do not participate in activities that require climbing or being in high places.  Do not enter a body of water--such as a lake, river, ocean, spa, or swimming pool--without an adult nearby who can monitor and help you. HOW CAN I KEEP OTHERS SAFE WHILE I AM TAKING PAIN MEDICINE?  Store your pain medicine as directed by your health care provider. Make sure that it is placed where children and pets cannot reach it.  Never share your pain medicine with anyone.  Do not save any leftover pills. If you have any leftover pain medicine, get rid of it or destroy it as directed by your  health care provider. WHAT ELSE DO I NEED TO KNOW ABOUT TAKING PAIN MEDICINE?  Use a stool softener if you become constipated from your pain medicine. Increasing your intake of fruits and vegetables will also help with constipation.  Write down the times when you take your pain medicine. Look at the times before you take your next dose of medicine. It is easy to become confused while on pain medicine. Recording the times helps you to avoid an overdose.  If your pain is severe, do not try to treat it yourself by taking more pills than instructed on your prescription. Contact your health care provider for help.  You may have been prescribed a pain medicine that contains acetaminophen. Do not take any other acetaminophen while taking this medicine. An overdose of acetaminophen can result in severe liver damage. Acetaminophen is found in many over-the-counter (OTC) and prescription medicines. If you are taking any medicines in addition to your pain medicine, check the active ingredients on those medicines to see if acetaminophen is listed. WHEN SHOULD I CALL MY HEALTH CARE PROVIDER?  Your medicine is not helping to make the pain go away.  You vomit or have diarrhea shortly after taking the medicine.  You develop new pain in areas that did not hurt before.  You have an allergic reaction to your medicine. This may include:  Itchiness.  Swelling.  Dizziness.  Developing a new rash. WHEN SHOULD I CALL  911 OR GO TO THE EMERGENCY ROOM?  You feel dizzy or you faint.  You are very confused or disoriented.  You repeatedly vomit.  Your skin or lips turn pale or bluish in color.  You have shortness of breath or you are breathing much more slowly than usual.  You have a severe allergic reaction to your medicine. This includes:  Developing tongue swelling.  Having difficulty breathing.   This information is not intended to replace advice given to you by your health care provider. Make  sure you discuss any questions you have with your health care provider.   Document Released: 09/27/2000 Document Revised: 11/05/2014 Document Reviewed: 04/25/2014 Elsevier Interactive Patient Education 2016 [employer] Cervical Radiculopathy Cervical radiculopathy happens when a nerve in the neck (cervical nerve) is pinched or bruised. This condition can develop because of an injury or as part of the normal aging process. Pressure on the cervical nerves can cause pain or numbness that runs from the neck all the way down into the arm and fingers. Usually, this condition gets better with rest. Treatment may be needed if the condition does not improve.  CAUSES This condition may be caused by:  Injury.  Slipped (herniated) disk.  Muscle tightness in the neck because of overuse.  Arthritis.  Breakdown or degeneration in the bones and joints of the spine (spondylosis) due to aging.  Bone spurs that may develop near the cervical nerves. SYMPTOMS Symptoms of this condition include:  Pain that runs from the neck to the arm and hand. The pain can be severe or irritating. It may be worse when the neck is moved.  Numbness or weakness in the affected arm and hand. DIAGNOSIS This condition may be diagnosed based on symptoms, medical history, and a physical exam. You may also have tests, including:  X-rays.  CT scan.  MRI.  Electromyogram (EMG).  Nerve conduction tests. TREATMENT In many cases, treatment is not needed for this condition. With rest, the condition usually gets better over time. If treatment is needed, options may include:  Wearing a soft neck collar for short periods of time.  Physical therapy to strengthen your neck muscles.  Medicines, such as NSAIDs, oral corticosteroids, or spinal injections.  Surgery. This may be needed if other treatments do not help. Various types of surgery may be done depending on the cause of your problems. HOME CARE INSTRUCTIONS Managing  Pain  Take over-the-counter and prescription medicines only as told by your health care provider.  If directed, apply ice to the affected area.  Put ice in a plastic bag.  Place a towel between your skin and the bag.  Leave the ice on for 20 minutes, 2-3 times per day.  If ice does not help, you can try using heat. Take a warm shower or warm bath, or use a heat pack as told by your health care provider.  Try a gentle neck and shoulder massage to help relieve symptoms. Activity  Rest as needed. Follow instructions from your health care provider about any restrictions on activities.  Do stretching and strengthening exercises as told by your health care provider or physical therapist. General Instructions  If you were given a soft collar, wear it as told by your health care provider.  Use a flat pillow when you sleep.  Keep all follow-up visits as told by your health care provider. This is important. SEEK MEDICAL CARE IF:  Your condition does not improve with treatment. SEEK IMMEDIATE MEDICAL CARE IF:  Your pain gets much worse and cannot be controlled with medicines.  You have weakness or numbness in your hand, arm, face, or leg.  You have a high fever.  You have a stiff, rigid neck.  You lose control of your bowels or your bladder (have incontinence).  You have trouble with walking, balance, or speaking.   This information is not intended to replace advice given to you by your health care provider. Make sure you discuss any questions you have with your health care provider.   Document Released: 03/16/2001 Document Revised: 03/12/2015 Document Reviewed: 08/15/2014 Elsevier Interactive Patient Education [employer]

## 2015-10-08 ENCOUNTER — Encounter (HOSPITAL_COMMUNITY): Payer: BLUE CROSS/BLUE SHIELD

## 2015-10-09 ENCOUNTER — Telehealth (HOSPITAL_COMMUNITY): Payer: Self-pay

## 2015-10-09 NOTE — Telephone Encounter (Signed)
DATE: 10/08/15  Called patient regarding his no show for his appointment. Patient informed therapist that Dr. Romeo AppleHarrison would like him to have a MRI as therapy has not helped his condition and it seems to be getting worse. Informed patient that we will cancel his remaining appointments and discharge him from therapy until further notice. Patient understood.   Limmie PatriciaLaura Jeffrie Lofstrom, OTR/L,CBIS  [phone]

## 2015-10-10 ENCOUNTER — Encounter (HOSPITAL_COMMUNITY): Payer: BLUE CROSS/BLUE SHIELD

## 2015-10-13 ENCOUNTER — Encounter (HOSPITAL_COMMUNITY): Payer: BLUE CROSS/BLUE SHIELD | Admitting: Specialist

## 2015-10-13 ENCOUNTER — Telehealth: Payer: Self-pay | Admitting: Radiology

## 2015-10-13 NOTE — Telephone Encounter (Signed)
[employer] is asking for a peer to peer review for the request of the MRI of the C-spine, CPT 72141. The # is [phone] Thank you

## 2015-10-15 ENCOUNTER — Ambulatory Visit (HOSPITAL_COMMUNITY): Payer: BLUE CROSS/BLUE SHIELD

## 2015-10-21 ENCOUNTER — Telehealth: Payer: Self-pay | Admitting: Orthopedic Surgery

## 2015-10-21 NOTE — Telephone Encounter (Signed)
ERROR

## 2015-10-21 NOTE — Telephone Encounter (Signed)
Debbie/Appeals Nurse/[employer] is providing approval for MRI Cervical Spine (Auth # [id number]) which is good for 30 days from today.  Call if questions.

## 2015-10-21 NOTE — Telephone Encounter (Signed)
Appointment for MRI scheduled 10/28/15 12:45p [facility] radiology  Patient aware

## 2015-10-28 ENCOUNTER — Encounter: Payer: Self-pay | Admitting: Gastroenterology

## 2015-10-28 ENCOUNTER — Encounter: Payer: Self-pay | Admitting: Family Medicine

## 2015-10-28 ENCOUNTER — Encounter: Payer: Self-pay | Admitting: *Deleted

## 2015-10-28 ENCOUNTER — Ambulatory Visit (INDEPENDENT_AMBULATORY_CARE_PROVIDER_SITE_OTHER): Payer: BLUE CROSS/BLUE SHIELD | Admitting: Family Medicine

## 2015-10-28 ENCOUNTER — Ambulatory Visit (HOSPITAL_COMMUNITY)
Admission: RE | Admit: 2015-10-28 | Discharge: 2015-10-28 | Disposition: A | Discharge status: Home / Self Care | Payer: BLUE CROSS/BLUE SHIELD | Source: Ambulatory Visit | Attending: Orthopedic Surgery | Admitting: Orthopedic Surgery

## 2015-10-28 VITALS — BP 126/78 | Ht 71.0 in | Wt 219.1 lb

## 2015-10-28 DIAGNOSIS — M5020 Other cervical disc displacement, unspecified cervical region: Secondary | ICD-10-CM | POA: Diagnosis not present

## 2015-10-28 DIAGNOSIS — M2578 Osteophyte, vertebrae: Secondary | ICD-10-CM | POA: Insufficient documentation

## 2015-10-28 DIAGNOSIS — R7309 Other abnormal glucose: Secondary | ICD-10-CM

## 2015-10-28 DIAGNOSIS — I10 Essential (primary) hypertension: Secondary | ICD-10-CM | POA: Diagnosis not present

## 2015-10-28 DIAGNOSIS — K219 Gastro-esophageal reflux disease without esophagitis: Secondary | ICD-10-CM | POA: Diagnosis not present

## 2015-10-28 DIAGNOSIS — M1288 Other specific arthropathies, not elsewhere classified, other specified site: Secondary | ICD-10-CM | POA: Diagnosis not present

## 2015-10-28 DIAGNOSIS — M797 Fibromyalgia: Secondary | ICD-10-CM | POA: Diagnosis not present

## 2015-10-28 DIAGNOSIS — Z139 Encounter for screening, unspecified: Secondary | ICD-10-CM

## 2015-10-28 DIAGNOSIS — M4802 Spinal stenosis, cervical region: Secondary | ICD-10-CM | POA: Diagnosis not present

## 2015-10-28 DIAGNOSIS — M50222 Other cervical disc displacement at C5-C6 level: Secondary | ICD-10-CM | POA: Diagnosis not present

## 2015-10-28 DIAGNOSIS — Z79899 Other long term (current) drug therapy: Secondary | ICD-10-CM

## 2015-10-28 DIAGNOSIS — R7301 Impaired fasting glucose: Secondary | ICD-10-CM

## 2015-10-28 MED ORDER — ESCITALOPRAM OXALATE 10 MG PO TABS: 10.0000 mg | ORAL_TABLET | Freq: Every day | ORAL | Status: DC

## 2015-10-28 MED ORDER — PANTOPRAZOLE SODIUM 40 MG PO TBEC: 40.0000 mg | DELAYED_RELEASE_TABLET | Freq: Every day | ORAL | Status: DC

## 2015-10-28 MED ORDER — ENALAPRIL MALEATE 10 MG PO TABS: 10.0000 mg | ORAL_TABLET | Freq: Every day | ORAL | Status: DC

## 2015-10-28 NOTE — Progress Notes (Signed)
   Subjective:    Patient ID: Vernon Fisher, male    DOB: 1955/11/28, 60 y.o.   MRN: [id number]  HPI Patient arrives for follow-up of his many chronic health concerns.  Continues experience substantial joint pain. Please see prior notes. Orthopedic note reviewed. Along with multiple physical therapy visits. Continues experience joint pain and discomfort  Compliant with blood pressure medication. Does not miss a dose blood pressure generally good when checked elsewhere no abnormalities numbers.  States definitely needs Protonix. Without it develops substantial reflux and heartburn.  Ongoing challenges. Wife chronically sick with cancer diagnosis. Taking the Lexapro. Helped some. No obvious negative side effects   Review of Systems No headache, no major weight loss or weight gain, no chest pain no back pain abdominal pain no change in bowel habits complete ROS otherwise negative     Objective:   Physical Exam  Alert no acute distress lungs clear. Heart regular rate and rhythm. HEENT normal. Hands some chronic arthritis changes abdominal exam benign      Assessment & Plan:  Impression 1 hypertension good control discussed maintain same meds #2 depression clinically stable handling current meds well meds reviewed #3 reflux ongoing challenges needs medicine to help maintain symptoms #4 chronic arthritis joint pain now followed by orthopedist plan all medicines refilled. #5 history impaired fasting glucose status uncertain Diet exercise discussed.blood work at this time. Recheck in 6 months WSL

## 2015-10-30 DIAGNOSIS — Z79899 Other long term (current) drug therapy: Secondary | ICD-10-CM | POA: Diagnosis not present

## 2015-10-30 DIAGNOSIS — I10 Essential (primary) hypertension: Secondary | ICD-10-CM | POA: Diagnosis not present

## 2015-10-30 DIAGNOSIS — Z139 Encounter for screening, unspecified: Secondary | ICD-10-CM | POA: Diagnosis not present

## 2015-10-31 ENCOUNTER — Ambulatory Visit (INDEPENDENT_AMBULATORY_CARE_PROVIDER_SITE_OTHER): Payer: BLUE CROSS/BLUE SHIELD | Admitting: Orthopedic Surgery

## 2015-10-31 ENCOUNTER — Telehealth: Payer: Self-pay | Admitting: *Deleted

## 2015-10-31 ENCOUNTER — Encounter: Payer: Self-pay | Admitting: Orthopedic Surgery

## 2015-10-31 VITALS — BP 121/77 | Ht 67.0 in | Wt 225.0 lb

## 2015-10-31 DIAGNOSIS — M5020 Other cervical disc displacement, unspecified cervical region: Secondary | ICD-10-CM | POA: Diagnosis not present

## 2015-10-31 DIAGNOSIS — M47812 Spondylosis without myelopathy or radiculopathy, cervical region: Secondary | ICD-10-CM | POA: Diagnosis not present

## 2015-10-31 DIAGNOSIS — M7542 Impingement syndrome of left shoulder: Secondary | ICD-10-CM | POA: Diagnosis not present

## 2015-10-31 LAB — HEPATIC FUNCTION PANEL
ALBUMIN: 4.5 g/dL (ref 3.5–5.5)
ALT: 40 IU/L (ref 0–44)
AST: 24 IU/L (ref 0–40)
Alkaline Phosphatase: 99 IU/L (ref 39–117)
Bilirubin Total: 1 mg/dL (ref 0.0–1.2)
Bilirubin, Direct: 0.22 mg/dL (ref 0.00–0.40)
TOTAL PROTEIN: 6.9 g/dL (ref 6.0–8.5)

## 2015-10-31 LAB — BASIC METABOLIC PANEL
BUN/Creatinine Ratio: 18 (ref 9–20)
BUN: 13 mg/dL (ref 6–24)
CALCIUM: 9.2 mg/dL (ref 8.7–10.2)
CO2: 24 mmol/L (ref 18–29)
Chloride: 100 mmol/L (ref 96–106)
Creatinine, Ser: 0.71 mg/dL — ABNORMAL LOW (ref 0.76–1.27)
GFR calc Af Amer: 120 mL/min/{1.73_m2} (ref >59)
GFR, EST NON AFRICAN AMERICAN: 104 mL/min/{1.73_m2} (ref >59)
Glucose: 98 mg/dL (ref 65–99)
POTASSIUM: 4.2 mmol/L (ref 3.5–5.2)
Sodium: 140 mmol/L (ref 134–144)

## 2015-10-31 LAB — LIPID PANEL
Chol/HDL Ratio: 5.5 ratio units — ABNORMAL HIGH (ref 0.0–5.0)
Cholesterol, Total: 198 mg/dL (ref 100–199)
HDL: 36 mg/dL — ABNORMAL LOW (ref >39)
LDL CALC: 134 mg/dL — AB (ref 0–99)
Triglycerides: 141 mg/dL (ref 0–149)
VLDL CHOLESTEROL CAL: 28 mg/dL (ref 5–40)

## 2015-10-31 LAB — TSH: TSH: 1.91 u[IU]/mL (ref 0.450–4.500)

## 2015-10-31 MED ORDER — HYDROCODONE-ACETAMINOPHEN 5-325 MG PO TABS: 1.0000 | ORAL_TABLET | Freq: Three times a day (TID) | ORAL | Status: DC | PRN

## 2015-10-31 MED ORDER — METHOCARBAMOL 500 MG PO TABS: 500.0000 mg | ORAL_TABLET | Freq: Three times a day (TID) | ORAL | Status: DC

## 2015-10-31 NOTE — Telephone Encounter (Signed)
REFERRAL FAXED TO [facility] 

## 2015-10-31 NOTE — Progress Notes (Signed)
Patient ID: Vernon Fisher, male   DOB: [birth year], 58 y.o.   MRN: [id number]  MRI FOLLOW UP  Chief Complaint  Patient presents with  . Follow-up    FOLLOW UP MRI C SPINE    HPI Vernon KetoRobert L Batterman is a 60 y.o. male.  Treated for left shoulder pain and continued to have discomfort so we got an MRI of his cervical spine  ROS  Weakness left upper extremity burning pain left upper extremity  Physical Exam    Data  Independent image interpretation the MRI showed  There is a paracentral disc herniation at C5-6 facet arthritis at C4-5 left foraminal stenosis   The report was read as follows   Discs: Degenerative disc disease with disc height loss at C5-6.   C2-3: No significant disc bulge. Moderate left facet arthropathy with mild left foraminal stenosis. No central canal stenosis.   C3-4: Mild broad-based disc bulge. Mild left facet arthropathy. Mild left foraminal stenosis. No central canal stenosis.   C4-5: No significant disc bulge. Moderate left facet arthropathy. Moderate left foraminal stenosis. No central canal stenosis.   C5-6: Left paracentral disc osteophyte complex with moderate left foraminal stenosis. No central canal stenosis.   C6-7: Mild broad-based disc bulge. No neural foraminal stenosis. No central canal stenosis.   C7-T1: No significant disc bulge. No neural foraminal stenosis. No central canal stenosis.   IMPRESSION: 1. At C5-6 there is a left paracentral disc osteophyte complex with moderate left foraminal stenosis. 2. At C4-5 there is a moderate left facet arthropathy. Moderate left foraminal stenosis.     Electronically Signed   By: Elige KoHetal  Patel   On: 10/28/2015 13:43  The plan is to continue the current medications and referred to neurosurgery  Meds ordered this encounter  Medications  . HYDROcodone-acetaminophen (NORCO/VICODIN) 5-325 MG tablet    Sig: Take 1 tablet by mouth every 8 (eight) hours as needed for moderate pain.    Dispense:   30 tablet    Refill:  0  . methocarbamol (ROBAXIN) 500 MG tablet    Sig: Take 1 tablet (500 mg total) by mouth 3 (three) times daily.    Dispense:  60 tablet    Refill:  1

## 2015-10-31 NOTE — Patient Instructions (Addendum)
Referral to Neurosurgery   Herniated Disk A herniated disk occurs when a disk in your spine bulges out too far. This condition is also called a ruptured disk or slipped disk. Your spine (backbone) is made up of bones called vertebrae. Between each pair of vertebrae is an oval disk with a soft, spongy center that acts as a shock absorber when you move. The spongy center is surrounded by a tough outer ring. When you have a herniated disk, the spongy center of the disk bulges out or ruptures through the outer ring. A herniated disk can press on a nerve between your vertebrae and cause pain. A herniated disk can occur anywhere in your back or neck area, but the lower back is the most common spot. CAUSES  In many cases, a herniated disk occurs just from getting older. As you age, the spongy insides of your disks tend to shrink and dry out. A herniated disk can result from gradual wear and tear. Injury or sudden strain can also cause a herniated disk.  RISK FACTORS Aging is the main risk factor for a herniated disk. Other risk factors include:  Being a man between the ages of 8030 and 5850 years.  Having a job that requires heavy lifting, bending, or twisting.  Having a job that requires long hours of driving.  Not getting enough exercise.  Being overweight.  Smoking. SIGNS AND SYMPTOMS  Signs and symptoms depend on which disk is herniated.  For a herniated disk in the lower back, you may have sharp pain in:  One part of your leg, hip, or buttocks.  The back of your calf.  The top or sole of your foot (sciatica).   For a herniated disk in the neck, you may feel pain:  When you move your neck.  Near or over your shoulder blade.  That moves to your upper arm, forearm, or fingers.   You may also have muscle weakness. It may be hard to:  Lift your leg or arm.  Stand on your toes.  Squeeze tightly with one of your hands.  Other symptoms can include:  Numbness or tingling in the  affected areas of your body.  Loss of bladder or bowel control. This is a rare but serious sign of a severe herniated disk in the lower back. DIAGNOSIS  Your health care provider will do a physical exam. During this exam, you may have to move certain body parts or assume various positions. For example, your health care provider may do the straight-leg test. This is a good way to test for a herniated disk in your lower back. In this test, the health care provider lifts your leg while you lie on your back. This is to see if you feel pain down your leg. Your health care provider will also check for numbness or loss of feeling.  Your health care provider will also check your:  Reflexes.  Muscle strength.  Posture.  Other tests may be done to help in making a diagnosis. These may include:  An X-ray of the spine to rule out other causes of back pain.   Other imaging studies, such as an MRI or CT scan. This is to check whether the herniated disk is pressing on your spinal canal.  Electromyography (EMG). This test checks the nerves that control muscles. It is sometimes used to identify the specific area of nerve involvement.  TREATMENT  In many cases, herniated disk symptoms go away over a period of days  or weeks. You will most likely be free of symptoms in 3-4 months. Treatment may include the following:  The initial treatment for a herniated disk is ashort period of rest.  Bed rest is often limited to 1 or 2 days. Resting for too long delays recovery.  If you have a herniated disk in your lower back, you should avoid sitting as much as possible because sitting increases pressure on the disk.  Medicines. These may include:   Nonsteroidal anti-inflammatory drugs (NSAIDs).  Muscle relaxants for back spasms.  Narcotic pain medicine if your pain is very bad.   Steroid injections. You may need these along the involved nerve root to help control pain. The steroid is injected in the area  of the herniated disk. It helps by reducing swelling around the disk.  Physical therapy. This may include exercises to strengthen the muscles that help support your spine.   You may need surgery if other treatments do not work.  HOME CARE INSTRUCTIONS Follow all your health care provider's instructions. These may include:  Take all medicines as directed by your health care provider.  Rest for 2 days and then start moving.  Do not sit or stand for long periods of time.  Maintain good posture when sitting and standing.  Avoid movements that cause pain, such as bending or lifting.  When you are able to start lifting things again:  [place] with your knees.  Keep your back straight.  Hold heavy objects close to your body.  If you are overweight, ask your health care provider to help you start a weight-loss program.  When you are able to start exercising, ask your health care provider how much and what type of exercise is best for you.  Work with a physical therapist on stretching and strengthening exercises for your back.  Do not wear high-heeled shoes.  Do not sleep on your belly.  Do not smoke.  Keep all follow-up visits as directed by your health care provider. SEEK MEDICAL CARE IF:  You have back or neck pain that is not getting better after 4 weeks.  You have very bad pain in your back or neck.  You develop numbness, tingling, or weakness along with pain. SEEK IMMEDIATE MEDICAL CARE IF:   You have numbness, tingling, or weakness that makes you unable to use your arms or legs.  You lose control of your bladder or bowels.  You have dizziness or fainting.  You have shortness of breath.  MAKE SURE YOU:   Understand these instructions.  Will watch your condition.  Will get help right away if you are not doing well or get worse.   This information is not intended to replace advice given to you by your health care provider. Make sure you discuss any questions  you have with your health care provider.   Document Released: 06/18/2000 Document Revised: 07/12/2014 Document Reviewed: 05/25/2013 Elsevier Interactive Patient Education [employer]

## 2015-11-04 ENCOUNTER — Encounter: Payer: Self-pay | Admitting: Family Medicine

## 2015-11-05 NOTE — Telephone Encounter (Signed)
APPT WITH DR CABBELL 11/10/15 11AM

## 2015-11-10 DIAGNOSIS — M4722 Other spondylosis with radiculopathy, cervical region: Secondary | ICD-10-CM | POA: Diagnosis not present

## 2015-11-10 DIAGNOSIS — Z6833 Body mass index (BMI) 33.0-33.9, adult: Secondary | ICD-10-CM | POA: Diagnosis not present

## 2015-11-13 ENCOUNTER — Other Ambulatory Visit: Payer: Self-pay

## 2015-11-13 ENCOUNTER — Encounter: Payer: Self-pay | Admitting: Gastroenterology

## 2015-11-13 ENCOUNTER — Ambulatory Visit (INDEPENDENT_AMBULATORY_CARE_PROVIDER_SITE_OTHER): Payer: BLUE CROSS/BLUE SHIELD | Admitting: Gastroenterology

## 2015-11-13 VITALS — BP 138/82 | HR 66 | Temp 97.9°F | Ht 71.0 in | Wt 215.4 lb

## 2015-11-13 DIAGNOSIS — R1310 Dysphagia, unspecified: Secondary | ICD-10-CM | POA: Diagnosis not present

## 2015-11-13 DIAGNOSIS — R195 Other fecal abnormalities: Secondary | ICD-10-CM | POA: Insufficient documentation

## 2015-11-13 NOTE — Patient Instructions (Signed)
Stop Protonix for now. Try Prilosec 1 capsule twice a day, 30 minutes before breakfast and dinner. If this works well, we can continue. If not, we will try Dexilant.   Please complete the stool studies and the stool sample (return the stool sample to our office and the stool studies to the lab).   We have scheduled you for an upper endoscopy with dilation with Dr. Darrick PennaFields in the near future.

## 2015-11-13 NOTE — Progress Notes (Signed)
Primary Care Physician:  Lubertha South, MD Primary Gastroenterologist:  Dr. [name]   Chief Complaint  Patient presents with  . Cough  . Dysphagia    HPI:   Vernon Fisher is a 60 y.o. male presenting today at the request of Dr. Romeo Apple secondary to cough and dysphagia. States he will swallow food, gets hung, won't go down. Liquid dysphagia noted as well. Turns his head a certain way and it will strangle him sometimes. Wakes up coughing. Protonix once daily, chronically. Stopped taking it for a time 3 years ago, had chest pain, then restarted and was better. Was evaluated at the hospital and "nothing was wrong". No unexplained weight loss. Pain at xiphoid process. Will bump it and cause discomfort. Rare nausea, vomiting. No rectal bleeding. Lately has had watery diarrhea for the past few weeks. About 3 times a day. No recent antibiotics. No rectal bleeding. Usually postprandial. Baseline bowel habits would be one BM a day. No new medications.   States will have lower abdominal discomfort, doubles him over. Not related to bowel habits. Will last about 5-10 minutes, then get better. Present for several years.   Past Medical History  Diagnosis Date  . GERD (gastroesophageal reflux disease)   . Hypertension   . Diabetes mellitus without complication (HCC)   . Migraine headache   . Degenerative disc disease   . Chronic pain   . Fibromyalgia   . Reflux   . Plantar fasciitis   . PONV (postoperative nausea and vomiting)   . Depression     Past Surgical History  Procedure Laterality Date  . Bilateral carpal tunnel release    . Right rotator cuff repair    . Nose surgery    . Colonoscopy  06/08/2011    Dr. [name]: normal. Repeat in 10 years  . Knee arthroscopy with medial menisectomy Left 09/06/2014    Procedure: KNEE ARTHROSCOPY WITH LIMITED DEBRIDEMENT;  Surgeon: Vickki Hearing, MD;  Location: [facility];  Service: Orthopedics;  Laterality: Left;    Current Outpatient Prescriptions    Medication Sig Dispense Refill  . celecoxib (CELEBREX) 200 MG capsule Take 1 capsule (200 mg total) by mouth 2 (two) times daily. 60 capsule 2  . enalapril (VASOTEC) 10 MG tablet Take 1 tablet (10 mg total) by mouth daily. 30 tablet 5  . escitalopram (LEXAPRO) 10 MG tablet Take 1 tablet (10 mg total) by mouth daily. 30 tablet 1  . HYDROcodone-acetaminophen (NORCO/VICODIN) 5-325 MG tablet Take 1 tablet by mouth every 8 (eight) hours as needed for moderate pain. 30 tablet 0  . methocarbamol (ROBAXIN) 500 MG tablet Take 1 tablet (500 mg total) by mouth 3 (three) times daily. 60 tablet 1  . pantoprazole (PROTONIX) 40 MG tablet Take 1 tablet (40 mg total) by mouth daily. 30 tablet 5   No current facility-administered medications for this visit.    Allergies as of 11/13/2015 - Review Complete 11/13/2015  Allergen Reaction Noted  . Augmentin [amoxicillin-pot clavulanate] Hives 07/30/2015  . Latex Rash 06/29/2012    Family History  Problem Relation Age of Onset  . Colon cancer Neg Hx   . Hypertension Brother   . Diabetes Father   . Hypertension Father   . Cancer Father     Social History   Social History  . Marital Status: Married    Spouse Name: N/A  . Number of Children: N/A  . Years of Education: N/A   Occupational History  . Not on file.  Social History Main Topics  . Smoking status: Former Smoker -- 1.00 packs/day for 20 years    Types: Cigarettes    Quit date: 09/02/1988  . Smokeless tobacco: Never Used  . Alcohol Use: No  . Drug Use: No  . Sexual Activity: Yes    Birth Control/ Protection: None   Other Topics Concern  . Not on file   Social History Narrative    Review of Systems: Gen: Denies any fever, chills, fatigue, weight loss, lack of appetite.  CV: Denies chest pain, heart palpitations, peripheral edema, syncope.  Resp: see HPI  GI: see HPI  GU : +urinary frequency  MS: +joint pain  Derm: Denies rash, itching, dry skin Psych: Denies depression,  anxiety, memory loss, and confusion Heme: Denies bruising, bleeding, and enlarged lymph nodes.  Physical Exam: BP 138/82 mmHg  Pulse 66  Temp(Src) 97.9 F (36.6 C)  Ht 5\' 11"  (1.803 m)  Wt 215 lb 6.4 oz (97.705 kg)  BMI 30.06 kg/m2 General:   Alert and oriented. Pleasant and cooperative. Well-nourished and well-developed.  Head:  Normocephalic and atraumatic. Eyes:  Without icterus, sclera clear and conjunctiva pink.  Ears:  Normal auditory acuity. Nose:  No deformity, discharge,  or lesions. Mouth:  No deformity or lesions, oral mucosa pink.  Lungs:  Clear to auscultation bilaterally. No wheezes, rales, or rhonchi. No distress.  Heart:  S1, S2 present without murmurs appreciated.  Abdomen:  +BS, soft, non-tender and non-distended. No HSM noted. No guarding or rebound. No masses appreciated.  Rectal:  Deferred  Msk:  Symmetrical without gross deformities. Normal posture. Extremities:  Without  edema. Neurologic:  Alert and  oriented x4;  grossly normal neurologically. Psych:  Alert and cooperative. Normal mood and affect.  Lab Results  Component Value Date   TSH 1.910 10/30/2015

## 2015-11-14 DIAGNOSIS — M4722 Other spondylosis with radiculopathy, cervical region: Secondary | ICD-10-CM | POA: Diagnosis not present

## 2015-11-18 ENCOUNTER — Ambulatory Visit (INDEPENDENT_AMBULATORY_CARE_PROVIDER_SITE_OTHER): Payer: BLUE CROSS/BLUE SHIELD

## 2015-11-18 ENCOUNTER — Other Ambulatory Visit: Payer: Self-pay | Admitting: Gastroenterology

## 2015-11-18 DIAGNOSIS — R195 Other fecal abnormalities: Secondary | ICD-10-CM | POA: Diagnosis not present

## 2015-11-18 DIAGNOSIS — R197 Diarrhea, unspecified: Secondary | ICD-10-CM | POA: Diagnosis not present

## 2015-11-19 LAB — IFOBT (OCCULT BLOOD): IMMUNOLOGICAL FECAL OCCULT BLOOD TEST: POSITIVE

## 2015-11-19 NOTE — Progress Notes (Signed)
Pt returned ifobt and it was positive 

## 2015-11-19 NOTE — Assessment & Plan Note (Addendum)
60 year old male with history of chronic GERD, now with solid food and liquid dysphagia. No unexplained weight loss. Rare nausea and vomiting. Currently taking Protonix once daily. Needs EGD in near future; he has never had his upper GI tract evaluated. Concern for web, ring, stricture, unable to exclude malignancy.   Proceed with upper endoscopy/dilation in the near future with Dr. Darrick PennaFields. The risks, benefits, and alternatives have been discussed in detail with patient. They have stated understanding and desire to proceed.  PROPOFOL due to polypharmacy Trial of Prilosec samples BID, as he has been on Protonix chronically. If Prilosec does not help, will be able to approve Dexilant through insurance as he would have failed 2 preferred PPIs.

## 2015-11-19 NOTE — Assessment & Plan Note (Addendum)
For the past few weeks, new onset, no new medications or risk for infectious process. Last colonoscopy in 2012 by Dr. Darrick PennaFields, with repeat due in 2022. Will obtain stool studies now. Ifobt in future. If negative stool studies, trial Bentyl or Levsin. Could consider Viberzi as his gallbladder remains in situ. Consider colonoscopy in future (after dysphagia addressed) if no improvement with supportive measures and/or positive ifobt.

## 2015-11-19 NOTE — Progress Notes (Signed)
Did patient complete stool samples? Also, did he have surgery recently for cervical issues? This was tentatively upcoming last week. I want to ensure he will be ok to have an upper endoscopy s/p surgery.  Nira RetortAnna W. Sams, ANP-BC [facility]

## 2015-11-19 NOTE — Progress Notes (Signed)
cc'ed to pcp °

## 2015-11-19 NOTE — Progress Notes (Signed)
Pt said he turned in his stool, he thinks yesterday, to [facility]. Said he wasn't sure he had enough specimen but the lady thought that he had enough. Said he had been constipated some from the pain meds.  He had his cervical surgery on Friday last week. He is doing fairly well, still having quite a bit of pain. He did not ask the doctor about the procedures, but said he will call back and ask him.  Forwarding to Gerrit HallsAnna Sams, NP.

## 2015-11-20 LAB — CLOSTRIDIUM DIFFICILE BY PCR

## 2015-11-21 LAB — GIARDIA ANTIGEN

## 2015-11-22 LAB — STOOL CULTURE

## 2015-11-24 NOTE — Progress Notes (Signed)
Quick Note:  Heme positive. However, he is having significant upper GI symptoms. Will address this first. Needs to return after EGD to discuss colonoscopy. Stool studies are negative. How is he doing? ______

## 2015-11-26 NOTE — Progress Notes (Signed)
Quick Note:  PT is aware heme positive. York SpanielSaid he is having difficulty swallowing. Not sure if it is coming form his neck surgery. Abdominal pain intermittent at his belt line. Please advise! ______

## 2015-11-27 NOTE — Progress Notes (Signed)
Quick Note:  LMOM for a return call. ______ 

## 2015-11-27 NOTE — Progress Notes (Signed)
Quick Note:  PT is aware. He said Dr. Franky Machoabbell has told him it is fine. I have just faxed Dr. Franky Machoabbell to confirm.  Will let you know when I hear from him. ______

## 2015-11-27 NOTE — Progress Notes (Signed)
Quick Note:  He is already scheduled for an EGD/dilation. We will proceed with colonoscopy at a later date, especially has he has significant upper GI symptoms. Can we have clearance from his surgeon for an EGD? It is in mid June. ______

## 2015-12-02 ENCOUNTER — Other Ambulatory Visit: Payer: Self-pay

## 2015-12-02 NOTE — Progress Notes (Signed)
I just received the fax back from Dr. Franky Machoabbell, that pt may undergo an endoscopy.

## 2015-12-02 NOTE — Progress Notes (Signed)
Noted and pt is set for 06/20

## 2015-12-02 NOTE — Progress Notes (Signed)
Forwarded to Gerrit HallsAnna Sams, NP and to Candy/Ginger.

## 2015-12-02 NOTE — Progress Notes (Signed)
Any word on clearance from Dr. Franky Machoabbell?

## 2015-12-03 NOTE — Progress Notes (Signed)
Quick Note:  PT is aware. ______ 

## 2015-12-03 NOTE — Progress Notes (Signed)
Quick Note:  Stool studies negative. Cdiff unable to be performed as it was formed stool. Appears he may be trending towards constipation. EGD as planned for June. ______

## 2015-12-08 DIAGNOSIS — M4722 Other spondylosis with radiculopathy, cervical region: Secondary | ICD-10-CM | POA: Diagnosis not present

## 2015-12-10 ENCOUNTER — Other Ambulatory Visit (HOSPITAL_COMMUNITY): Payer: Self-pay | Admitting: Neurosurgery

## 2015-12-10 DIAGNOSIS — M545 Low back pain: Secondary | ICD-10-CM

## 2015-12-12 NOTE — Patient Instructions (Signed)
Vernon Fisher L Fisher  12/12/2015     @PREFPERIOPPHARMACY @   Your procedure is scheduled on 12/23/2015.  [facility]  Call this number if you have problems the morning of surgery:  [phone]   Remember:  Do not eat food or drink liquids after midnight.  Take these medicines the morning of surgery with A SIP OF WATER : VASOTEC, LEXAPRO, VICODIN AND PROTONIX   Do not wear jewelry, make-up or nail polish.  Do not wear lotions, powders, or perfumes.  You may wear deodorant.  Do not shave 48 hours prior to surgery.  Men may shave face and neck.  Do not bring valuables to the hospital.  [facility] not responsible for any belongings or valuables.  Contacts, dentures or bridgework may not be worn into surgery.  Leave your suitcase in the car.  After surgery it may be brought to your room.  For patients admitted to the hospital, discharge time will be determined by your treatment team.  Patients discharged the day of surgery will not be allowed to drive home.   Name and phone number of your driver:   FAMILY Special instructions:  N/A  Please read over the following fact sheets that you were given. Care and Recovery After Surgery   Esophagogastroduodenoscopy Esophagogastroduodenoscopy (EGD) is a procedure that is used to examine the lining of the esophagus, stomach, and first part of the small intestine (duodenum). A long, flexible, lighted tube with a camera attached (endoscope) is inserted down the throat to view these organs. This procedure is done to detect problems or abnormalities, such as inflammation, bleeding, ulcers, or growths, in order to treat them. The procedure lasts 5-20 minutes. It is usually an outpatient procedure, but it may need to be performed in a hospital in emergency cases. LET [facility] KNOW ABOUT:  Any allergies you have.  All medicines you are taking, including vitamins, herbs, eye drops, creams, and over-the-counter  medicines.  Previous problems you or members of your family have had with the use of anesthetics.  Any blood disorders you have.  Previous surgeries you have had.  Medical conditions you have. RISKS AND COMPLICATIONS Generally, this is a safe procedure. However, problems can occur and include:  Infection.  Bleeding.  Tearing (perforation) of the esophagus, stomach, or duodenum.  Difficulty breathing or not being able to breathe.  Excessive sweating.  Spasms of the larynx.  Slowed heartbeat.  Low blood pressure. BEFORE THE PROCEDURE  Do not eat or drink anything after midnight on the night before the procedure or as directed by your health care provider.  Do not take your regular medicines before the procedure if your health care provider asks you not to. Ask your health care provider about changing or stopping those medicines.  If you wear dentures, be prepared to remove them before the procedure.  Arrange for someone to drive you home after the procedure. PROCEDURE  A numbing medicine (local anesthetic) may be sprayed in your throat for comfort and to stop you from gagging or coughing.  You will have an IV tube inserted in a vein in your hand or arm. You will receive medicines and fluids through this tube.  You will be given a medicine to relax you (sedative).  A pain reliever will be given through the IV tube.  A mouth guard may be placed in your mouth to protect your teeth and to keep you from biting on the endoscope.  You will be asked to lie on your left side.  The endoscope will be inserted down your throat and into your esophagus, stomach, and duodenum.  Air will be put through the endoscope to allow your health care provider to clearly view the lining of your esophagus.  The lining of your esophagus, stomach, and duodenum will be examined. During the exam, your health care provider may:  Remove tissue to be examined under a microscope (biopsy) for  inflammation, infection, or other medical problems.  Remove growths.  Remove objects (foreign bodies) that are stuck.  Treat any bleeding with medicines or other devices that stop tissues from bleeding (hot cautery, clipping devices).  Widen (dilate) or stretch narrowed areas of your esophagus and stomach.  The endoscope will be withdrawn. AFTER THE PROCEDURE  You will be taken to a recovery area for observation. Your blood pressure, heart rate, breathing rate, and blood oxygen level will be monitored often until the medicines you were given have worn off.  Do not eat or drink anything until the numbing medicine has worn off and your gag reflex has returned. You may choke.  Your health care provider should be able to discuss his or her findings with you. It will take longer to discuss the test results if any biopsies were taken.   This information is not intended to replace advice given to you by your health care provider. Make sure you discuss any questions you have with your health care provider.   Document Released: 10/22/2004 Document Revised: 07/12/2014 Document Reviewed: 05/24/2012 Elsevier Interactive Patient Education [employer].

## 2015-12-16 ENCOUNTER — Encounter (HOSPITAL_COMMUNITY)
Admission: RE | Admit: 2015-12-16 | Discharge: 2015-12-16 | Disposition: A | Discharge status: Home / Self Care | Payer: BLUE CROSS/BLUE SHIELD | Source: Ambulatory Visit | Attending: Gastroenterology | Admitting: Gastroenterology

## 2015-12-16 ENCOUNTER — Other Ambulatory Visit: Payer: Self-pay

## 2015-12-16 ENCOUNTER — Encounter (HOSPITAL_COMMUNITY): Payer: Self-pay

## 2015-12-16 DIAGNOSIS — M47896 Other spondylosis, lumbar region: Secondary | ICD-10-CM | POA: Diagnosis not present

## 2015-12-16 DIAGNOSIS — I10 Essential (primary) hypertension: Secondary | ICD-10-CM | POA: Diagnosis not present

## 2015-12-16 DIAGNOSIS — Z01810 Encounter for preprocedural cardiovascular examination: Secondary | ICD-10-CM | POA: Diagnosis not present

## 2015-12-16 DIAGNOSIS — G894 Chronic pain syndrome: Secondary | ICD-10-CM | POA: Diagnosis not present

## 2015-12-16 DIAGNOSIS — M4806 Spinal stenosis, lumbar region: Secondary | ICD-10-CM | POA: Diagnosis not present

## 2015-12-16 DIAGNOSIS — R5383 Other fatigue: Secondary | ICD-10-CM | POA: Diagnosis not present

## 2015-12-16 DIAGNOSIS — M545 Low back pain: Secondary | ICD-10-CM | POA: Diagnosis present

## 2015-12-16 DIAGNOSIS — I4510 Unspecified right bundle-branch block: Secondary | ICD-10-CM | POA: Diagnosis not present

## 2015-12-16 DIAGNOSIS — R1310 Dysphagia, unspecified: Secondary | ICD-10-CM | POA: Diagnosis not present

## 2015-12-16 DIAGNOSIS — Z0189 Encounter for other specified special examinations: Secondary | ICD-10-CM | POA: Diagnosis not present

## 2015-12-16 HISTORY — DX: Anxiety disorder, unspecified: F41.9

## 2015-12-16 LAB — CBC WITH DIFFERENTIAL/PLATELET
BASOS PCT: 0 %
Basophils Absolute: 0 10*3/uL (ref 0.0–0.1)
EOS ABS: 0.2 10*3/uL (ref 0.0–0.7)
EOS PCT: 2 %
HCT: 46.2 % (ref 39.0–52.0)
Hemoglobin: 16.3 g/dL (ref 13.0–17.0)
Lymphocytes Relative: 29 %
Lymphs Abs: 2.5 10*3/uL (ref 0.7–4.0)
MCH: 31 pg (ref 26.0–34.0)
MCHC: 35.3 g/dL (ref 30.0–36.0)
MCV: 88 fL (ref 78.0–100.0)
Monocytes Absolute: 0.7 10*3/uL (ref 0.1–1.0)
Monocytes Relative: 8 %
Neutro Abs: 5.3 10*3/uL (ref 1.7–7.7)
Neutrophils Relative %: 61 %
PLATELETS: 173 10*3/uL (ref 150–400)
RBC: 5.25 MIL/uL (ref 4.22–5.81)
RDW: 12.7 % (ref 11.5–15.5)
WBC: 8.7 10*3/uL (ref 4.0–10.5)

## 2015-12-16 LAB — BASIC METABOLIC PANEL
Anion gap: 6 (ref 5–15)
BUN: 11 mg/dL (ref 6–20)
CO2: 26 mmol/L (ref 22–32)
CREATININE: 0.67 mg/dL (ref 0.61–1.24)
Calcium: 9.3 mg/dL (ref 8.9–10.3)
Chloride: 107 mmol/L (ref 101–111)
GFR calc Af Amer: >60 mL/min (ref >60)
GLUCOSE: 131 mg/dL — AB (ref 65–99)
POTASSIUM: 3.3 mmol/L — AB (ref 3.5–5.1)
SODIUM: 139 mmol/L (ref 135–145)

## 2015-12-16 NOTE — Pre-Procedure Instructions (Signed)
Patient given information to sign up for my chart at home. 

## 2015-12-18 ENCOUNTER — Ambulatory Visit (HOSPITAL_COMMUNITY): Payer: BLUE CROSS/BLUE SHIELD

## 2015-12-18 ENCOUNTER — Ambulatory Visit (HOSPITAL_COMMUNITY)
Admission: RE | Admit: 2015-12-18 | Discharge: 2015-12-18 | Disposition: A | Discharge status: Home / Self Care | Payer: BLUE CROSS/BLUE SHIELD | Source: Ambulatory Visit | Attending: Neurosurgery | Admitting: Neurosurgery

## 2015-12-18 DIAGNOSIS — Z0189 Encounter for other specified special examinations: Secondary | ICD-10-CM | POA: Diagnosis not present

## 2015-12-18 DIAGNOSIS — I4510 Unspecified right bundle-branch block: Secondary | ICD-10-CM | POA: Diagnosis not present

## 2015-12-18 DIAGNOSIS — G894 Chronic pain syndrome: Secondary | ICD-10-CM | POA: Insufficient documentation

## 2015-12-18 DIAGNOSIS — M47896 Other spondylosis, lumbar region: Secondary | ICD-10-CM | POA: Diagnosis not present

## 2015-12-18 DIAGNOSIS — Z01810 Encounter for preprocedural cardiovascular examination: Secondary | ICD-10-CM | POA: Insufficient documentation

## 2015-12-18 DIAGNOSIS — R5383 Other fatigue: Secondary | ICD-10-CM | POA: Insufficient documentation

## 2015-12-18 DIAGNOSIS — M47816 Spondylosis without myelopathy or radiculopathy, lumbar region: Secondary | ICD-10-CM | POA: Diagnosis not present

## 2015-12-18 DIAGNOSIS — M4806 Spinal stenosis, lumbar region: Secondary | ICD-10-CM | POA: Diagnosis not present

## 2015-12-18 DIAGNOSIS — M545 Low back pain: Secondary | ICD-10-CM

## 2015-12-18 DIAGNOSIS — I10 Essential (primary) hypertension: Secondary | ICD-10-CM | POA: Insufficient documentation

## 2015-12-18 DIAGNOSIS — R1310 Dysphagia, unspecified: Secondary | ICD-10-CM | POA: Insufficient documentation

## 2015-12-23 ENCOUNTER — Ambulatory Visit (HOSPITAL_COMMUNITY): Payer: BLUE CROSS/BLUE SHIELD | Admitting: Anesthesiology

## 2015-12-23 ENCOUNTER — Encounter (HOSPITAL_COMMUNITY): Payer: Self-pay | Admitting: *Deleted

## 2015-12-23 ENCOUNTER — Encounter (HOSPITAL_COMMUNITY)
Admission: RE | Disposition: A | Discharge status: Home Health | Payer: Self-pay | Source: Ambulatory Visit | Attending: Gastroenterology

## 2015-12-23 ENCOUNTER — Ambulatory Visit (HOSPITAL_COMMUNITY)
Admission: RE | Admit: 2015-12-23 | Discharge: 2015-12-23 | Disposition: A | Discharge status: Home Health | Payer: BLUE CROSS/BLUE SHIELD | Source: Ambulatory Visit | Attending: Gastroenterology | Admitting: Gastroenterology

## 2015-12-23 DIAGNOSIS — Z809 Family history of malignant neoplasm, unspecified: Secondary | ICD-10-CM | POA: Insufficient documentation

## 2015-12-23 DIAGNOSIS — F419 Anxiety disorder, unspecified: Secondary | ICD-10-CM | POA: Diagnosis not present

## 2015-12-23 DIAGNOSIS — Z79899 Other long term (current) drug therapy: Secondary | ICD-10-CM | POA: Diagnosis not present

## 2015-12-23 DIAGNOSIS — G8929 Other chronic pain: Secondary | ICD-10-CM | POA: Insufficient documentation

## 2015-12-23 DIAGNOSIS — K449 Diaphragmatic hernia without obstruction or gangrene: Secondary | ICD-10-CM | POA: Diagnosis not present

## 2015-12-23 DIAGNOSIS — K2970 Gastritis, unspecified, without bleeding: Secondary | ICD-10-CM

## 2015-12-23 DIAGNOSIS — K2950 Unspecified chronic gastritis without bleeding: Secondary | ICD-10-CM | POA: Insufficient documentation

## 2015-12-23 DIAGNOSIS — Z87891 Personal history of nicotine dependence: Secondary | ICD-10-CM | POA: Diagnosis not present

## 2015-12-23 DIAGNOSIS — K219 Gastro-esophageal reflux disease without esophagitis: Secondary | ICD-10-CM | POA: Insufficient documentation

## 2015-12-23 DIAGNOSIS — K222 Esophageal obstruction: Secondary | ICD-10-CM

## 2015-12-23 DIAGNOSIS — M1990 Unspecified osteoarthritis, unspecified site: Secondary | ICD-10-CM | POA: Diagnosis not present

## 2015-12-23 DIAGNOSIS — R1310 Dysphagia, unspecified: Secondary | ICD-10-CM | POA: Diagnosis not present

## 2015-12-23 DIAGNOSIS — E119 Type 2 diabetes mellitus without complications: Secondary | ICD-10-CM | POA: Diagnosis not present

## 2015-12-23 DIAGNOSIS — R1013 Epigastric pain: Secondary | ICD-10-CM

## 2015-12-23 DIAGNOSIS — F329 Major depressive disorder, single episode, unspecified: Secondary | ICD-10-CM | POA: Insufficient documentation

## 2015-12-23 DIAGNOSIS — M797 Fibromyalgia: Secondary | ICD-10-CM | POA: Insufficient documentation

## 2015-12-23 DIAGNOSIS — I10 Essential (primary) hypertension: Secondary | ICD-10-CM | POA: Diagnosis not present

## 2015-12-23 HISTORY — PX: SAVORY DILATION: SHX5439

## 2015-12-23 HISTORY — PX: BIOPSY: SHX5522

## 2015-12-23 HISTORY — PX: ESOPHAGOGASTRODUODENOSCOPY (EGD) WITH PROPOFOL: SHX5813

## 2015-12-23 LAB — GLUCOSE, CAPILLARY
Glucose-Capillary: 102 mg/dL — ABNORMAL HIGH (ref 65–99)
Glucose-Capillary: 90 mg/dL (ref 65–99)

## 2015-12-23 SURGERY — ESOPHAGOGASTRODUODENOSCOPY (EGD) WITH PROPOFOL
Anesthesia: Monitor Anesthesia Care

## 2015-12-23 MED ORDER — LACTATED RINGERS IV SOLN
INTRAVENOUS | Status: DC
  Administered 2015-12-23: 07:00:00 via INTRAVENOUS

## 2015-12-23 MED ORDER — PROPOFOL 500 MG/50ML IV EMUL
INTRAVENOUS | Status: DC | PRN
  Administered 2015-12-23: 125 ug/kg/min via INTRAVENOUS
  Administered 2015-12-23: 100 ug/kg/min via INTRAVENOUS

## 2015-12-23 MED ORDER — FENTANYL CITRATE (PF) 100 MCG/2ML IJ SOLN: 25.0000 ug | INTRAMUSCULAR | Status: DC | PRN

## 2015-12-23 MED ORDER — FENTANYL CITRATE (PF) 100 MCG/2ML IJ SOLN
INTRAMUSCULAR | Status: AC
  Filled 2015-12-23: qty 2

## 2015-12-23 MED ORDER — MIDAZOLAM HCL 2 MG/2ML IJ SOLN
INTRAMUSCULAR | Status: AC
  Filled 2015-12-23: qty 2

## 2015-12-23 MED ORDER — ONDANSETRON HCL 4 MG/2ML IJ SOLN
4.0000 mg | Freq: Once | INTRAMUSCULAR | Status: AC
  Administered 2015-12-23: 4 mg via INTRAVENOUS

## 2015-12-23 MED ORDER — ONDANSETRON HCL 4 MG/2ML IJ SOLN
INTRAMUSCULAR | Status: AC
  Filled 2015-12-23: qty 2

## 2015-12-23 MED ORDER — LIDOCAINE VISCOUS 2 % MT SOLN
OROMUCOSAL | Status: AC
  Filled 2015-12-23: qty 15

## 2015-12-23 MED ORDER — FENTANYL CITRATE (PF) 100 MCG/2ML IJ SOLN
25.0000 ug | INTRAMUSCULAR | Status: AC | PRN
  Administered 2015-12-23 (×2): 25 ug via INTRAVENOUS

## 2015-12-23 MED ORDER — MIDAZOLAM HCL 5 MG/5ML IJ SOLN
INTRAMUSCULAR | Status: DC | PRN
  Administered 2015-12-23: 1 mg via INTRAVENOUS

## 2015-12-23 MED ORDER — LIDOCAINE HCL (PF) 1 % IJ SOLN
INTRAMUSCULAR | Status: AC
  Filled 2015-12-23: qty 5

## 2015-12-23 MED ORDER — ONDANSETRON HCL 4 MG/2ML IJ SOLN: 4.0000 mg | Freq: Once | INTRAMUSCULAR | Status: DC | PRN

## 2015-12-23 MED ORDER — PROPOFOL 10 MG/ML IV BOLUS
INTRAVENOUS | Status: AC
  Filled 2015-12-23: qty 40

## 2015-12-23 MED ORDER — MINERAL OIL PO OIL
TOPICAL_OIL | ORAL | Status: AC
  Filled 2015-12-23: qty 30

## 2015-12-23 MED ORDER — MIDAZOLAM HCL 2 MG/2ML IJ SOLN
1.0000 mg | INTRAMUSCULAR | Status: DC | PRN
  Administered 2015-12-23: 2 mg via INTRAVENOUS

## 2015-12-23 MED ORDER — LIDOCAINE VISCOUS 2 % MT SOLN: 5.0000 mL | Freq: Two times a day (BID) | OROMUCOSAL | Status: DC

## 2015-12-23 NOTE — Discharge Instructions (Signed)
I dilated your esophagus. You have a stricture near the base of your esophagus. You have a small hiatal hernia. You have gastritis. I biopsied your stomach AND SMALL BOWEL.   TAKE A PROBIOTIC DAILY FOR THREE MONTHS ([employer] BRAND, [employer] HEALTH, ALIGN).   DRINK WATER TO KEEP YOUR URINE LIGHT YELLOW.  FOLLOW A LOW FAT DIET. MEATS SHOULD BE BAKED, BROILED, OR BOILED. AVOID FRIED FOODS. SEE INFO BELOW.  CONTINUE YOUR WEIGHT LOSS EFFORTS. LOSE TEN POUNDS.  CONTINUE PROTONIX. TAKE 30 MINUTES PRIOR TO YOUR FIRST MEAL.  YOUR BIOPSY RESULTS WILL BE AVAILABLE IN MY CHART JUN 23 AND MY OFFICE WILL CONTACT YOU IN 10-14 DAYS WITH YOUR RESULTS.   FOLLOW UP IN 3 MOS.    UPPER ENDOSCOPY AFTER CARE Read the instructions outlined below and refer to this sheet in the next week. These discharge instructions provide you with general information on caring for yourself after you leave the hospital. While your treatment has been planned according to the most current medical practices available, unavoidable complications occasionally occur. If you have any problems or questions after discharge, call DR. FIELDS, [phone].  ACTIVITY  You may resume your regular activity, but move at a slower pace for the next 24 hours.   Take frequent rest periods for the next 24 hours.   Walking will help get rid of the air and reduce the bloated feeling in your belly (abdomen).   No driving for 24 hours (because of the medicine (anesthesia) used during the test).   You may shower.   Do not sign any important legal documents or operate any machinery for 24 hours (because of the anesthesia used during the test).    NUTRITION  Drink plenty of fluids.   You may resume your normal diet as instructed by your doctor.   Begin with a light meal and progress to your normal diet. Heavy or fried foods are harder to digest and may make you feel sick to your stomach (nauseated).   Avoid alcoholic beverages for  24 hours or as instructed.    MEDICATIONS  You may resume your normal medications.   WHAT YOU CAN EXPECT TODAY  Some feelings of bloating in the abdomen.   Passage of more gas than usual.    IF YOU HAD A BIOPSY TAKEN DURING THE UPPER ENDOSCOPY:  Eat a soft diet IF YOU HAVE NAUSEA, BLOATING, ABDOMINAL PAIN, OR VOMITING.    FINDING OUT THE RESULTS OF YOUR TEST Not all test results are available during your visit. DR. [name] WILL CALL YOU WITHIN 14 DAYS OF YOUR PROCEDUE WITH YOUR RESULTS. Do not assume everything is normal if you have not heard from DR. FIELDS, CALL HER OFFICE AT [phone].  SEEK IMMEDIATE MEDICAL ATTENTION AND CALL THE OFFICE: [phone] IF:  You have more than a spotting of blood in your stool.   Your belly is swollen (abdominal distention).   You are nauseated or vomiting.   You have a temperature over 101F.   You have abdominal pain or discomfort that is severe or gets worse throughout the day.    Low-Fat Diet BREADS, CEREALS, PASTA, RICE, DRIED PEAS, AND BEANS These products are high in carbohydrates and most are low in fat. Therefore, they can be increased in the diet as substitutes for fatty foods. They too, however, contain calories and should not be eaten in excess. Cereals can be eaten for snacks as well as for breakfast.  Include foods that contain fiber (fruits, vegetables, whole  grains, and legumes). Research shows that fiber may lower blood cholesterol levels, especially the water-soluble fiber found in fruits, vegetables, oat products, and legumes. FRUITS AND VEGETABLES It is good to eat fruits and vegetables. Besides being sources of fiber, both are rich in vitamins and some minerals. They help you get the daily allowances of these nutrients. Fruits and vegetables can be used for snacks and desserts. MEATS Limit lean meat, chicken, [place], and fish to no more than 6 ounces per day. Beef, Pork, and Lamb Use lean cuts of beef, pork, and  lamb. Lean cuts include:  Extra-lean ground beef.  Arm roast.  Sirloin tip.  Center-cut ham.  Round steak.  Loin chops.  Rump roast.  Tenderloin.  Trim all fat off the outside of meats before cooking. It is not necessary to severely decrease the intake of red meat, but lean choices should be made. Lean meat is rich in protein and contains a highly absorbable form of iron. Premenopausal women, in particular, should avoid reducing lean red meat because this could increase the risk for low red blood cells (iron-deficiency anemia).  Chicken and [place] These are good sources of protein. The fat of poultry can be reduced by removing the skin and underlying fat layers before cooking. Chicken and [place] can be substituted for lean red meat in the diet. Poultry should not be fried or covered with high-fat sauces. Fish and Shellfish Fish is a good source of protein. Shellfish contain cholesterol, but they usually are low in saturated fatty acids. The preparation of fish is important. Like chicken and [place], they should not be fried or covered with high-fat sauces. EGGS Egg whites contain no fat or cholesterol. They can be eaten often. Try 1 to 2 egg whites instead of whole eggs in recipes or use egg substitutes that do not contain yolk.  MILK AND DAIRY PRODUCTS Use skim or 1% milk instead of 2% or whole milk. Decrease whole milk, natural, and processed cheeses. Use nonfat or low-fat (2%) cottage cheese or low-fat cheeses made from vegetable oils. Choose nonfat or low-fat (1 to 2%) yogurt. Experiment with evaporated skim milk in recipes that call for heavy cream. Substitute low-fat yogurt or low-fat cottage cheese for sour cream in dips and salad dressings. Have at least 2 servings of low-fat dairy products, such as 2 glasses of skim (or 1%) milk each day to help get your daily calcium intake.  FATS AND OILS Butterfat, lard, and beef fats are high in saturated fat and cholesterol. These should be  avoided.Vegetable fats do not contain cholesterol. AVOID coconut oil, palm oil, and palm kernel oil, WHICH are very high in saturated fats. These should be limited. These fats are often used in bakery goods, processed foods, popcorn, oils, and nondairy creamers. Vegetable shortenings and some peanut butters contain hydrogenated oils, which are also saturated fats. Read the labels on these foods and check for saturated vegetable oils.  Desirable liquid vegetable oils are corn oil, cottonseed oil, olive oil, canola oil, safflower oil, soybean oil, and sunflower oil. Peanut oil is not as good, but small amounts are acceptable. Buy a heart-healthy tub margarine that has no partially hydrogenated oils in the ingredients. AVOID Mayonnaise and salad dressings often are made from unsaturated fats.  OTHER EATING TIPS Snacks  Most sweets should be limited as snacks. They tend to be rich in calories and fats, and their caloric content outweighs their nutritional value. Some good choices in snacks are graham crackers, melba toast, soda  crackers, bagels (no egg), English muffins, fruits, and vegetables. These snacks are preferable to snack crackers, [place] fries, and chips. Popcorn should be air-popped or cooked in small amounts of liquid vegetable oil.  Desserts Eat fruit, low-fat yogurt, and fruit ices instead of pastries, cake, and cookies. Sherbet, angel food cake, gelatin dessert, frozen low-fat yogurt, or other frozen products that do not contain saturated fat (pure fruit juice bars, frozen ice pops) are also acceptable.   COOKING METHODS Choose those methods that use little or no fat. They include: Poaching.  Braising.  Steaming.  Grilling.  Baking.  Stir-frying.  Broiling.  Microwaving.  Foods can be cooked in a nonstick pan without added fat, or use a nonfat cooking spray in regular cookware. Limit fried foods and avoid frying in saturated fat. Add moisture to lean meats by using water, broth,  cooking wines, and other nonfat or low-fat sauces along with the cooking methods mentioned above. Soups and stews should be chilled after cooking. The fat that forms on top after a few hours in the refrigerator should be skimmed off. When preparing meals, avoid using excess salt. Salt can contribute to raising blood pressure in some people.  EATING AWAY FROM HOME Order entres, potatoes, and vegetables without sauces or butter. When meat exceeds the size of a deck of cards (3 to 4 ounces), the rest can be taken home for another meal. Choose vegetable or fruit salads and ask for low-calorie salad dressings to be served on the side. Use dressings sparingly. Limit high-fat toppings, such as bacon, crumbled eggs, cheese, sunflower seeds, and olives. Ask for heart-healthy tub margarine instead of butter.   Gastritis  Gastritis is an inflammation (the body's way of reacting to injury and/or infection) of the stomach. It is often caused by viral or bacterial (germ) infections. It can also be caused BY ASPIRIN, BC/GOODY POWDER'S, (IBUPROFEN) MOTRIN, OR ALEVE (NAPROXEN), chemicals (including alcohol), SPICY FOODS, and medications. This illness may be associated with generalized malaise (feeling tired, not well), UPPER ABDOMINAL STOMACH cramps, and fever. One common bacterial cause of gastritis is an organism known as H. Pylori. This can be treated with antibiotics.   Hiatal Hernia A hiatal hernia occurs when a part of the stomach slides above the diaphragm. The diaphragm is the thin muscle separating the belly (abdomen) from the chest. A hiatal hernia can be something you are born with or develop over time. Hiatal hernias may allow stomach acid to flow back into your esophagus, the tube which carries food from your mouth to your stomach. If this acid causes problems it is called GERD (gastro-esophageal reflux disease).   SYMPTOMS Common symptoms of GERD are heartburn (burning in your chest). This is worse  when lying down or bending over. It may also cause belching and indigestion. Some of the things which make GERD worse are:  Increased weight pushes on stomach making acid rise more easily.   Smoking markedly increases acid production.   Alcohol decreases lower esophageal sphincter pressure (valve between stomach and esophagus), allowing acid from stomach into esophagus.   Late evening meals and going to bed with a full stomach increases pressure.   HOME CARE INSTRUCTIONS  Try to achieve and maintain an ideal body weight.   Avoid drinking alcoholic beverages.   DO NOT smokE.   Do not wear tight clothing around your chest or stomach.   Eat smaller meals and eat more frequently. This keeps your stomach from getting too full. Eat slowly.  Do not lie down for 2 or 3 hours after eating. Do not eat or drink anything 1 to 2 hours before going to bed.   Avoid caffeine beverages (colas, coffee, cocoa, tea), fatty foods, citrus fruits and all other foods and drinks that contain acid and that seem to increase the problems.   Avoid bending over, especially after eating OR STRAINING. Anything that increases the pressure in your belly increases the amount of acid that may be pushed up into your esophagus.    ESOPHAGEAL STRICTURE  Esophageal strictures can be caused by stomach acid backing up into the tube that carries food from the mouth down to the stomach (lower esophagus).  TREATMENT There are a number of medicines used to treat reflux/stricture, including: Antacids.  Proton-pump inhibitors: PROTONIX   HOME CARE INSTRUCTIONS Eat 2-3 hours before going to bed.  Try to reach and maintain a healthy weight.  Do not eat just a few very large meals. Instead, eat 4 TO 6 smaller meals throughout the day.  Try to identify foods and beverages that make your symptoms worse, and avoid these.  Avoid tight clothing.  Do not exercise right after eating.

## 2015-12-23 NOTE — H&P (Signed)
Primary Care Physician:  Lubertha SouthSteve Luking, MD Primary Gastroenterologist:  Dr. Darrick PennaFields  Pre-Procedure History & Physical: HPI:  Vernon Fisher is a 60 y.o. male here for DYSPHAGIA/diarrhea.  Past Medical History  Diagnosis Date  . GERD (gastroesophageal reflux disease)   . Hypertension   . Migraine headache   . Degenerative disc disease   . Chronic pain   . Fibromyalgia   . Reflux   . Plantar fasciitis   . PONV (postoperative nausea and vomiting)   . Depression   . Diabetes mellitus without complication (HCC)     diet controlled  . Anxiety     Past Surgical History  Procedure Laterality Date  . Bilateral carpal tunnel release    . Right rotator cuff repair    . Nose surgery      repair of fracture and deviated septum from fracture  . Colonoscopy  06/08/2011    Dr. Darrick PennaFields: normal. Repeat in 10 years  . Knee arthroscopy with medial menisectomy Left 09/06/2014    Procedure: KNEE ARTHROSCOPY WITH LIMITED DEBRIDEMENT;  Surgeon: Vickki HearingStanley E Harrison, MD;  Location: [facility];  Service: Orthopedics;  Laterality: Left;  . Cervical fusion      plates and screws    Prior to Admission medications   Medication Sig Start Date End Date Taking? Authorizing Provider  celecoxib (CELEBREX) 200 MG capsule Take 1 capsule (200 mg total) by mouth 2 (two) times daily. 10/07/15  Yes Vickki HearingStanley E Harrison, MD  enalapril (VASOTEC) 10 MG tablet Take 1 tablet (10 mg total) by mouth daily. 10/28/15  Yes Merlyn AlbertWilliam S Luking, MD  escitalopram (LEXAPRO) 10 MG tablet Take 1 tablet (10 mg total) by mouth daily. 10/28/15  Yes Merlyn AlbertWilliam S Luking, MD  HYDROcodone-acetaminophen (NORCO/VICODIN) 5-325 MG tablet Take 1 tablet by mouth every 8 (eight) hours as needed for moderate pain. 10/31/15  Yes Vickki HearingStanley E Harrison, MD  methocarbamol (ROBAXIN) 500 MG tablet Take 1 tablet (500 mg total) by mouth 3 (three) times daily. 10/31/15  Yes Vickki HearingStanley E Harrison, MD  Multiple Vitamins-Minerals (MULTIVITAMIN PO) Take 1 tablet by mouth daily.   Yes  Historical Provider, MD  pantoprazole (PROTONIX) 40 MG tablet Take 1 tablet (40 mg total) by mouth daily. 10/28/15  Yes Merlyn AlbertWilliam S Luking, MD    Allergies as of 11/13/2015 - Review Complete 11/13/2015  Allergen Reaction Noted  . Augmentin [amoxicillin-pot clavulanate] Hives 07/30/2015  . Latex Rash 06/29/2012    Family History  Problem Relation Age of Onset  . Colon cancer Neg Hx   . Hypertension Brother   . Diabetes Father   . Hypertension Father   . Cancer Father     Social History   Social History  . Marital Status: Married    Spouse Name: N/A  . Number of Children: N/A  . Years of Education: N/A   Occupational History  . Not on file.   Social History Main Topics  . Smoking status: Former Smoker -- 1.00 packs/day for 20 years    Types: Cigarettes    Quit date: 09/02/1988  . Smokeless tobacco: Never Used  . Alcohol Use: No  . Drug Use: No  . Sexual Activity: Yes    Birth Control/ Protection: None   Other Topics Concern  . Not on file   Social History Narrative    Review of Systems: See HPI, otherwise negative ROS   Physical Exam: Temp(Src) 98.1 F (36.7 C) General:   Alert,  pleasant and cooperative in NAD Head:  Normocephalic  and atraumatic. Neck:  Supple; Lungs:  Clear throughout to auscultation.    Heart:  Regular rate and rhythm. Abdomen:  Soft, nontender and nondistended. Normal bowel sounds, without guarding, and without rebound.   Neurologic:  Alert and  oriented x4;  grossly normal neurologically.  Impression/Plan:    DYSPHAGIA/diarrhea   PLAN:  EGD/DIL TODAY

## 2015-12-23 NOTE — Anesthesia Postprocedure Evaluation (Signed)
Anesthesia Post Note  Patient: [name]  Procedure(s) Performed: Procedure(s) (LRB): ESOPHAGOGASTRODUODENOSCOPY (EGD) WITH PROPOFOL (N/A) SAVORY DILATION (N/A) BIOPSY  Patient location during evaluation: PACU Anesthesia Type: MAC Level of consciousness: awake and alert and oriented Pain management: pain level controlled Vital Signs Assessment: post-procedure vital signs reviewed and stable Respiratory status: spontaneous breathing Cardiovascular status: blood pressure returned to baseline Postop Assessment: no signs of nausea or vomiting Anesthetic complications: no    Last Vitals:  Filed Vitals:   12/23/15 0848 12/23/15 0859  BP: 128/89 128/78  Pulse: 60   Temp:    Resp: 14     Last Pain:  Filed Vitals:   12/23/15 0900  PainSc: 0-No pain                 Keri Tavella

## 2015-12-23 NOTE — Transfer of Care (Signed)
Immediate Anesthesia Transfer of Care Note  Patient: Vernon KetoRobert L Fisher  Procedure(s) Performed: Procedure(s) with comments: ESOPHAGOGASTRODUODENOSCOPY (EGD) WITH PROPOFOL (N/A) - 730  SAVORY DILATION (N/A) BIOPSY - duodenal bx gastric bx esophageal bx  Patient Location: PACU  Anesthesia Type:MAC  Level of Consciousness: sedated  Airway & Oxygen Therapy: Patient Spontanous Breathing and Patient connected to face mask oxygen  Post-op Assessment: Report given to RN  Post vital signs: Reviewed and stable  Last Vitals:  Filed Vitals:   12/23/15 0645 12/23/15 0650  BP: 123/77 115/78  Temp:    Resp: 14 14    Last Pain:  Filed Vitals:   12/23/15 0825  PainSc: 5       Patients Stated Pain Goal: 6 (12/23/15 [id number])  Complications: No apparent anesthesia complications

## 2015-12-23 NOTE — Anesthesia Preprocedure Evaluation (Addendum)
Anesthesia Evaluation  Patient identified by MRN, date of birth, ID band Patient awake    Reviewed: Allergy & Precautions, NPO status , Patient's Chart, lab work & pertinent test results  History of Anesthesia Complications (+) PONV and history of anesthetic complications  Airway Mallampati: II  TM Distance: >3 FB     Dental  (+) Teeth Intact, Dental Advisory Given   Pulmonary former smoker,    breath sounds clear to auscultation       Cardiovascular hypertension, Pt. on medications  Rhythm:Regular Rate:Normal     Neuro/Psych  Headaches, PSYCHIATRIC DISORDERS Depression    GI/Hepatic GERD  Medicated and Controlled,  Endo/Other  diabetes, Type 2  Renal/GU      Musculoskeletal  (+) Arthritis , Fibromyalgia -, narcotic dependent  Abdominal   Peds  Hematology   Anesthesia Other Findings Chronic pain   Reproductive/Obstetrics                            Anesthesia Physical Anesthesia Plan  ASA: III  Anesthesia Plan: MAC   Post-op Pain Management:    Induction: Intravenous  Airway Management Planned: Simple Face Mask  Additional Equipment:   Intra-op Plan:   Post-operative Plan:   Informed Consent: I have reviewed the patients History and Physical, chart, labs and discussed the procedure including the risks, benefits and alternatives for the proposed anesthesia with the patient or authorized representative who has indicated his/her understanding and acceptance.     Plan Discussed with:   Anesthesia Plan Comments:        Anesthesia Quick Evaluation

## 2015-12-23 NOTE — Op Note (Addendum)
[facility] Patient Name: Vernon Fisher Procedure Date: 12/23/2015 7:26 AM MRN: [id number] Date of Birth: [birth year] Attending MD: Jonette Eva , MD CSN: [id number] Age: 60 Admit Type: Outpatient Procedure:                Upper GI endoscopy WITH ESOPHAGEAL DILATION AND                            COLD FORCEPS BIOPSY Indications:              Epigastric abdominal pain, Abdominal pain in the                            right upper quadrant, Dyspepsia. SYMPTOM OCCURRED                            AFTER CHANGING DIET(BROCCOLI) AND ADDING 1000 MG                            CINAMMON TO CONTROL LIPIDS AND BMI > 40 Providers:                Jonette Eva, MD, Loma Messing B. Patsy Lager, RN, Burke Keels, Technician Referring MD:             Simone Curia, MD Medicines:                Propofol per Anesthesia Complications:            No immediate complications. Estimated Blood Loss:     Estimated blood loss was minimal. Procedure:                Pre-Anesthesia Assessment:                           - Prior to the procedure, a History and Physical                            was performed, and patient medications and                            allergies were reviewed. The patient's tolerance of                            previous anesthesia was also reviewed. The risks                            and benefits of the procedure and the sedation                            options and risks were discussed with the patient.                            All questions were answered, and informed consent  was obtained. Prior Anticoagulants: The patient has                            taken previous NSAID medication, last dose was day                            of procedure. ASA Grade Assessment: II - A patient                            with mild systemic disease. After reviewing the                            risks and benefits, the patient was deemed in                          satisfactory condition to undergo the procedure.                           After obtaining informed consent, the endoscope was                            passed under direct vision. Throughout the                            procedure, the patient's blood pressure, pulse, and                            oxygen saturations were monitored continuously. The                            EG-[id number] ([id number]) scope was introduced through the                            mouth, and advanced to the second part of duodenum.                            The upper GI endoscopy was accomplished without                            difficulty. The patient tolerated the procedure                            fairly well. Scope In: 7:56:02 AM Scope Out: 8:14:45 AM Total Procedure Duration: 0 hours 18 minutes 43 seconds  Findings:      A moderate Schatzki ring (acquired) was found at the gastroesophageal       junction. A guidewire was placed and the scope was withdrawn. Dilation       was performed with a Savary dilator with mild resistance at 14 mm, 15       mm, 16 mm and 17 mm. Biopsies were obtained from the proximal and distal       esophagus with cold forceps for histology of suspected eosinophilic       esophagitis(EOE).(20 CM AND 40 CM FROM THE INCISORS, GE JXN 45 CM  FROM       THE INCISORS)      A small hiatal hernia was present.      Scattered mild inflammation characterized by congestion (edema),       erythema and friability was found in the gastric antrum. Biopsies were       taken with a cold forceps for Helicobacter pylori testing.      The examined duodenum was normal. Biopsies for histology were taken with       a cold forceps for evaluation of celiac disease. Impression:               - DYSPHAGIA DUE TO Moderate Schatzki ring. BIOPSIES                            PENDING FOR EOE.                           - Small hiatal hernia.                           - DYSPEPSIA DUE TO  GERD/GASTRITIS Moderate Sedation:      Per Anesthesia Care Recommendation:           - High fiber diet and low fat diet.                           - Use Protonix (pantoprazole) 40 mg PO daily.                           - Await pathology results.                           - Return to my office in 3 months.                           - Patient has a contact number available for                            emergencies. The signs and symptoms of potential                            delayed complications were discussed with the                            patient. Return to normal activities tomorrow.                            Written discharge instructions were provided to the                            patient.                           - Post procedure medication instructions were                            provided to the patient. Procedure Code(s):        ---  Professional ---                           [id number], Esophagogastroduodenoscopy, flexible,                            transoral; with insertion of guide wire followed by                            passage of dilator(s) through esophagus over guide                            wire                           43239, Esophagogastroduodenoscopy, flexible,                            transoral; with biopsy, single or multiple Diagnosis Code(s):        --- Professional ---                           K22.2, Esophageal obstruction                           K44.9, Diaphragmatic hernia without obstruction or                            gangrene                           K29.70, Gastritis, unspecified, without bleeding                           R10.13, Epigastric pain                           R10.11, Right upper quadrant pain CPT copyright 2016 [employer]. All rights reserved. The codes documented in this report are preliminary and upon coder review may  be revised to meet current compliance requirements. Jonette EvaSandi Fields, MD Jonette EvaSandi  Fields, MD 12/23/2015 5:49:39 PM This report has been signed electronically. Number of Addenda: 0

## 2015-12-25 DIAGNOSIS — M545 Low back pain: Secondary | ICD-10-CM | POA: Diagnosis not present

## 2015-12-25 DIAGNOSIS — Z6832 Body mass index (BMI) 32.0-32.9, adult: Secondary | ICD-10-CM | POA: Diagnosis not present

## 2015-12-25 DIAGNOSIS — R030 Elevated blood-pressure reading, without diagnosis of hypertension: Secondary | ICD-10-CM | POA: Diagnosis not present

## 2015-12-29 ENCOUNTER — Encounter (HOSPITAL_COMMUNITY): Payer: Self-pay | Admitting: Gastroenterology

## 2015-12-30 ENCOUNTER — Other Ambulatory Visit: Payer: Self-pay | Admitting: Family Medicine

## 2016-01-12 ENCOUNTER — Encounter: Payer: Self-pay | Admitting: Gastroenterology

## 2016-01-12 ENCOUNTER — Telehealth: Payer: Self-pay | Admitting: Gastroenterology

## 2016-01-12 NOTE — Telephone Encounter (Signed)
Please call pt. His stomach Bx shows mild gastritis. THE ESOPHAGUS BIOPSIES ARE NORMAL.  HIS SMALL BOWEL BIOPSIES ARE NORMAL.  TAKE A PROBIOTIC DAILY FOR THREE MONTHS.  DRINK WATER TO KEEP YOUR URINE LIGHT YELLOW.  FOLLOW A LOW FAT DIET. MEATS SHOULD BE BAKED, BROILED, OR BOILED. AVOID FRIED FOODS. SEE INFO BELOW.  CONTINUE YOUR WEIGHT LOSS EFFORTS. LOSE TEN POUNDS.  CONTINUE PROTONIX. TAKE 30 MINUTES PRIOR TO YOUR FIRST MEAL.  FOLLOW UP IN 3 MOS E30 DYSPHAGIA/GASTRITIS.

## 2016-01-12 NOTE — Telephone Encounter (Signed)
Pt is aware.  

## 2016-01-12 NOTE — Telephone Encounter (Signed)
LMOM to call.

## 2016-01-12 NOTE — Telephone Encounter (Signed)
APPT MADE AND LETTER SENT  °

## 2016-02-03 DIAGNOSIS — I10 Essential (primary) hypertension: Secondary | ICD-10-CM | POA: Diagnosis not present

## 2016-02-03 DIAGNOSIS — M4806 Spinal stenosis, lumbar region: Secondary | ICD-10-CM | POA: Diagnosis not present

## 2016-02-03 DIAGNOSIS — M5416 Radiculopathy, lumbar region: Secondary | ICD-10-CM | POA: Diagnosis not present

## 2016-02-03 DIAGNOSIS — Z6832 Body mass index (BMI) 32.0-32.9, adult: Secondary | ICD-10-CM | POA: Diagnosis not present

## 2016-03-01 ENCOUNTER — Ambulatory Visit (INDEPENDENT_AMBULATORY_CARE_PROVIDER_SITE_OTHER): Payer: BLUE CROSS/BLUE SHIELD | Admitting: Family Medicine

## 2016-03-01 ENCOUNTER — Encounter: Payer: Self-pay | Admitting: Family Medicine

## 2016-03-01 VITALS — BP 134/76 | Temp 98.0°F | Ht 71.0 in | Wt 216.4 lb

## 2016-03-01 DIAGNOSIS — R5383 Other fatigue: Secondary | ICD-10-CM

## 2016-03-01 DIAGNOSIS — H8110 Benign paroxysmal vertigo, unspecified ear: Secondary | ICD-10-CM | POA: Diagnosis not present

## 2016-03-01 MED ORDER — MECLIZINE HCL 25 MG PO TABS: ORAL_TABLET | ORAL | 1 refills | Status: DC

## 2016-03-01 MED ORDER — ONDANSETRON 8 MG PO TBDP: ORAL_TABLET | ORAL | 1 refills | Status: DC

## 2016-03-01 NOTE — Progress Notes (Signed)
   Subjective:    Patient ID: Vernon Fisher, male    DOB: Jul 12, 1955, 60 y.o.   MRN: [id number]  Dizziness  This is a new problem. The current episode started in the past 7 days. Associated symptoms include headaches. Pertinent negatives include no numbness or weakness. Associated symptoms comments: Diarrhea. The symptoms are aggravated by bending and standing.   Patient states no other concerns this visit.  Started on sat Felt unsteady Worse with certain movements No strength or weakness Mild posterior headache for past few months headche sometimes awakens him Dr Alphonzo Fisher did neck surgery- fusion of disc 4 months ago   patient does state that he has headaches that come up in the back of the neck to the top ahead make it difficult to go to sleep at times he states this is gotten worse since having surgery on his neck approximate 4 months ago   patient does relate dizziness when he turns his head. He states when he rolls over he gets dizzy as well he denies unilateral numbness weakness denies any difficulty walking.   Review of Systems  Neurological: Positive for dizziness, light-headedness and headaches. Negative for syncope, speech difficulty, weakness and numbness.       Objective:   Physical Exam  dizziness does increase when he follows up in from left to right. Romberg negative. Finger to nose normal. No vertical nystagmus. Slight horizontal nystagmus is noted. Strength normal bilateral.   Hallpike  Maneuver positive for vertigo symptoms    Assessment & Plan:  Epley  Maneuver shown. Patient will try this Antivert when necessary  Zofran when necessary If progressive symptoms or not doing better over the course of the next week follow-up  Chronic headaches that a been worse since having neck surgery 4 months ago he is keep track of his headaches plus he is also to follow-up with Vernon Fisher in several weeks for significant fatigue and tiredness  I've encouraged patient to call  his neurosurgeon of follow-up regarding his next surgery

## 2016-03-02 LAB — CBC WITH DIFFERENTIAL/PLATELET
BASOS: 0 %
Basophils Absolute: 0 10*3/uL (ref 0.0–0.2)
EOS (ABSOLUTE): 0.2 10*3/uL (ref 0.0–0.4)
Eos: 2 %
Hematocrit: 45.6 % (ref 37.5–51.0)
Hemoglobin: 15.9 g/dL (ref 12.6–17.7)
Immature Grans (Abs): 0.1 10*3/uL (ref 0.0–0.1)
Immature Granulocytes: 1 %
Lymphocytes Absolute: 2.6 10*3/uL (ref 0.7–3.1)
Lymphs: 27 %
MCH: 30.7 pg (ref 26.6–33.0)
MCHC: 34.9 g/dL (ref 31.5–35.7)
MCV: 88 fL (ref 79–97)
MONOS ABS: 0.7 10*3/uL (ref 0.1–0.9)
Monocytes: 7 %
NEUTROS ABS: 6.3 10*3/uL (ref 1.4–7.0)
NEUTROS PCT: 63 %
PLATELETS: 220 10*3/uL (ref 150–379)
RBC: 5.18 x10E6/uL (ref 4.14–5.80)
RDW: 13.6 % (ref 12.3–15.4)
WBC: 9.8 10*3/uL (ref 3.4–10.8)

## 2016-03-02 LAB — BASIC METABOLIC PANEL
BUN / CREAT RATIO: 16 (ref 9–20)
BUN: 10 mg/dL (ref 6–24)
CHLORIDE: 100 mmol/L (ref 96–106)
CO2: 26 mmol/L (ref 18–29)
Calcium: 9.2 mg/dL (ref 8.7–10.2)
Creatinine, Ser: 0.61 mg/dL — ABNORMAL LOW (ref 0.76–1.27)
GFR calc non Af Amer: 111 mL/min/{1.73_m2} (ref >59)
GFR, EST AFRICAN AMERICAN: 128 mL/min/{1.73_m2} (ref >59)
Glucose: 106 mg/dL — ABNORMAL HIGH (ref 65–99)
POTASSIUM: 3.8 mmol/L (ref 3.5–5.2)
SODIUM: 142 mmol/L (ref 134–144)

## 2016-03-02 LAB — TSH: TSH: 1.61 u[IU]/mL (ref 0.450–4.500)

## 2016-03-04 ENCOUNTER — Other Ambulatory Visit: Payer: Self-pay | Admitting: Family Medicine

## 2016-03-22 ENCOUNTER — Ambulatory Visit (INDEPENDENT_AMBULATORY_CARE_PROVIDER_SITE_OTHER): Payer: BLUE CROSS/BLUE SHIELD | Admitting: Family Medicine

## 2016-03-22 ENCOUNTER — Encounter: Payer: Self-pay | Admitting: Family Medicine

## 2016-03-22 VITALS — BP 118/72 | Ht 71.0 in | Wt 218.0 lb

## 2016-03-22 DIAGNOSIS — R5383 Other fatigue: Secondary | ICD-10-CM | POA: Diagnosis not present

## 2016-03-22 DIAGNOSIS — I10 Essential (primary) hypertension: Secondary | ICD-10-CM

## 2016-03-22 DIAGNOSIS — H8110 Benign paroxysmal vertigo, unspecified ear: Secondary | ICD-10-CM

## 2016-03-22 DIAGNOSIS — M797 Fibromyalgia: Secondary | ICD-10-CM

## 2016-03-22 DIAGNOSIS — Z23 Encounter for immunization: Secondary | ICD-10-CM

## 2016-03-22 MED ORDER — DICLOFENAC SODIUM 75 MG PO TBEC: 75.0000 mg | DELAYED_RELEASE_TABLET | Freq: Two times a day (BID) | ORAL | 2 refills | Status: DC

## 2016-03-22 MED ORDER — PANTOPRAZOLE SODIUM 40 MG PO TBEC: 40.0000 mg | DELAYED_RELEASE_TABLET | Freq: Every day | ORAL | 5 refills | Status: DC

## 2016-03-22 MED ORDER — ENALAPRIL MALEATE 10 MG PO TABS: 10.0000 mg | ORAL_TABLET | Freq: Every day | ORAL | 5 refills | Status: DC

## 2016-03-22 MED ORDER — ZOLPIDEM TARTRATE 10 MG PO TABS: 10.0000 mg | ORAL_TABLET | Freq: Every evening | ORAL | 2 refills | Status: DC | PRN

## 2016-03-22 MED ORDER — ESCITALOPRAM OXALATE 10 MG PO TABS: 10.0000 mg | ORAL_TABLET | Freq: Every day | ORAL | 5 refills | Status: DC

## 2016-03-22 NOTE — Progress Notes (Signed)
   Subjective:    Patient ID: Vernon Fisher, male    DOB: [birth year], 60 y.o.   MRN: [id number] Patient arrives office with numerous concerns. HPI Patient arrives to follow up on significant fatigue . Patient once again experiencing the profound fatigue which has plagued him off and on for many years. He is not working at this time. States he had to stop working in order to health it his wife with her medical issues. Reports just generalized fatigue and tiredness.  Also reports substantial difficulty with insomnia. Not sleeping as well night. Impact his daytime energy.  and discuss headaches. Nuchal radiating towards the top. Nearly daily. Steady in nature. Over-the-counter meds not doing much. Has not been back to see his neurosurgeon. See prior notes.   Med wise otc meds not helping much Not much impact from surgery  Pt not workoing due to caring for loved one with cancer  Walks and stays active    See 03/01/16 visit- patient states he is doing some better.  Dizziness was quite a problem a few weeks ago, has calm down somewhat. Was quite substantial.  Gaylyn RongHa is post moving to the front  Steady, felt the surg went ok.     Review of Systems No headache, no major weight loss or weight gain, no chest pain no back pain abdominal pain no change in bowel habits complete ROS otherwise negative     Objective:   Physical Exam Alert vitals stable, NAD. Blood pressure good on repeat.Positive somewhat painful limitation in rotation neck in all directions otherwise HEENT normal. Lungs clear. Heart regular rate and rhythm. Neuro exam intact      Assessment & Plan:  Impression 1 recent vertigo improved no need for major workup at this time discussed #2 fatigue ongoing #3 headaches likely multifactorial with element of stress and postsurgical discomfort #4 hypertension good control #5 insomnia discussed plan medications refilled. Intervention via Ambien diet exercise discussed in encourage check  every 6 months WSL

## 2016-04-08 ENCOUNTER — Ambulatory Visit (INDEPENDENT_AMBULATORY_CARE_PROVIDER_SITE_OTHER): Payer: BLUE CROSS/BLUE SHIELD | Admitting: Family Medicine

## 2016-04-08 ENCOUNTER — Encounter: Payer: Self-pay | Admitting: Family Medicine

## 2016-04-08 VITALS — BP 112/74 | Temp 98.4°F | Ht 71.0 in | Wt 219.5 lb

## 2016-04-08 DIAGNOSIS — J329 Chronic sinusitis, unspecified: Secondary | ICD-10-CM

## 2016-04-08 MED ORDER — LEVOFLOXACIN 500 MG PO TABS: 500.0000 mg | ORAL_TABLET | Freq: Every day | ORAL | 0 refills | Status: DC

## 2016-04-08 NOTE — Progress Notes (Signed)
   Subjective:    Patient ID: Vernon Fisher, male    DOB: 02/20/1956, 60 y.o.   MRN: [id number]  Sinusitis  This is a new problem. The current episode started in the past 7 days. The problem is unchanged. There has been no fever. Associated symptoms include congestion, coughing, ear pain, headaches and a sore throat. Treatments tried: Mucinex. The treatment provided no relief.   muc d m   Hit hard headach4   Frontal   boyth ears aggravating   Burning and other in the chest    Review of Systems  HENT: Positive for congestion, ear pain and sore throat.   Respiratory: Positive for cough.   Neurological: Positive for headaches.       Objective:   Physical Exam Alert, mild malaise. Hydration good Vitals stable. frontal/ maxillary tenderness evident positive nasal congestion. pharynx normal neck supple  lungs clear/no crackles or wheezes. heart regular in rhythm        Assessment & Plan:  Impression rhinosinusitis likely post viral, discussed with patient. plan antibiotics prescribed. Questions answered. Symptomatic care discussed. warning signs discussed. WSL

## 2016-04-14 ENCOUNTER — Other Ambulatory Visit: Payer: Self-pay

## 2016-04-14 ENCOUNTER — Ambulatory Visit (INDEPENDENT_AMBULATORY_CARE_PROVIDER_SITE_OTHER): Payer: BLUE CROSS/BLUE SHIELD | Admitting: Gastroenterology

## 2016-04-14 ENCOUNTER — Encounter: Payer: Self-pay | Admitting: Gastroenterology

## 2016-04-14 DIAGNOSIS — K921 Melena: Secondary | ICD-10-CM

## 2016-04-14 DIAGNOSIS — R195 Other fecal abnormalities: Secondary | ICD-10-CM | POA: Diagnosis not present

## 2016-04-14 MED ORDER — NA SULFATE-K SULFATE-MG SULF 17.5-3.13-1.6 GM/177ML PO SOLN: 1.0000 | ORAL | 0 refills | Status: DC

## 2016-04-14 MED ORDER — DICYCLOMINE HCL 10 MG PO CAPS: 10.0000 mg | ORAL_CAPSULE | Freq: Three times a day (TID) | ORAL | 3 refills | Status: DC

## 2016-04-14 NOTE — Assessment & Plan Note (Signed)
60 year old male presenting with heme positive stool, change in bowel habits with alternating loose stool intermittently, stool studies unrevealing. Lower abdominal/suprapubic discomfort but without urinary symptoms noted that is constant now but abdominal exam benign. CHRONIC for past several years but worsening in frequency/intensity. No acute findings on exam. Doubt CT would shed much light at this time. Last colonoscopy 2012 unrevealing. With heme positive stool and change in bowel habits, will proceed with colonoscopy. May ultimately need CT if colonoscopy unrevealing.   Proceed with colonoscopy with Dr. Darrick PennaFields in the near future. The risks, benefits, and alternatives have been discussed in detail with the patient. They state understanding and desire to proceed.  PROPOFOL due to polypharmacy If CT felt necessary, will need pre-med due to shellfish allergy.  Bentyl for supportive measures

## 2016-04-14 NOTE — Progress Notes (Addendum)
REVIEWED-NO ADDITIONAL RECOMMENDATIONS.  Referring Provider: Mikey Kirschner, MD Primary Care Physician:  Mickie Hillier, MD  Primary GI: Dr. Oneida Alar   Chief Complaint  Patient presents with  . Follow-up    had EGD, doing ok    HPI:   Vernon Fisher is a 60 y.o. male presenting today with a history of dysphagia, undergoing EGD recently with Schatzki's ring s/p dilation. Dysphagia resolved. Reports of intermittent loose stool at last visit with negative stool studies. Cdiff PCR unable to be obtained as stool was formed actually when submitted stool sample. Chronic lower abdominal discomfort for last several years.   Heme positive recently. Lower abdominal discomfort noted, hurts all the way across. Worsened since last seen. Sometimes feels like someone is sticking a knife in him. Constant now. Loose stools half/half. Will start out watery then go solid, then sometimes start out solid then go watery. Some days will have normal bowel movements and just have one. 50/50. Doesn't feel like he leans one way or the other. No rectal bleeding. Last colonoscopy in 2012 by Dr. Oneida Alar: normal. Yesterday was trying to do mild activity and was "soaking wet". Woke up in the middle of the night soaking wet. Hurts to take a deep breath. No fever. No chills. Pain present for several years. Will vomit when he feels like he is overheating really bad.   Past Medical History:  Diagnosis Date  . Anxiety   . Chronic pain   . Degenerative disc disease   . Depression   . Diabetes mellitus without complication (HCC)    diet controlled  . Fibromyalgia   . GERD (gastroesophageal reflux disease)   . Hypertension   . Migraine headache   . Plantar fasciitis   . PONV (postoperative nausea and vomiting)   . Reflux     Past Surgical History:  Procedure Laterality Date  . bilateral carpal tunnel release    . BIOPSY  12/23/2015   Procedure: BIOPSY;  Surgeon: Danie Binder, MD;  Location: [facility];   Service: Endoscopy;;  duodenal bx gastric bx esophageal bx  . CERVICAL FUSION     plates and screws  . COLONOSCOPY  06/08/2011   Dr. Oneida Alar: normal. Repeat in 10 years  . ESOPHAGOGASTRODUODENOSCOPY (EGD) WITH PROPOFOL N/A 12/23/2015   Dr. Oneida Alar: moderate Schatzki's ring s/p dilation, small hiatal hernia, benign small bowel biopsies, negative for EOE, mild gastritis  . KNEE ARTHROSCOPY WITH MEDIAL MENISECTOMY Left 09/06/2014   Procedure: KNEE ARTHROSCOPY WITH LIMITED DEBRIDEMENT;  Surgeon: Carole Civil, MD;  Location: [facility];  Service: Orthopedics;  Laterality: Left;  . NOSE SURGERY     repair of fracture and deviated septum from fracture  . Right Rotator Cuff repair    . SAVORY DILATION N/A 12/23/2015   Procedure: SAVORY DILATION;  Surgeon: Danie Binder, MD;  Location: [facility];  Service: Endoscopy;  Laterality: N/A;    Current Outpatient Prescriptions  Medication Sig Dispense Refill  . diclofenac (VOLTAREN) 75 MG EC tablet Take 1 tablet (75 mg total) by mouth 2 (two) times daily. As needed for pain or headache 36 tablet 2  . enalapril (VASOTEC) 10 MG tablet Take 1 tablet (10 mg total) by mouth daily. 30 tablet 5  . escitalopram (LEXAPRO) 10 MG tablet Take 1 tablet (10 mg total) by mouth daily. 30 tablet 5  . HYDROcodone-acetaminophen (NORCO/VICODIN) 5-325 MG tablet Take 1 tablet by mouth every 8 (eight) hours as needed for moderate pain.  30 tablet 0  . levofloxacin (LEVAQUIN) 500 MG tablet Take 1 tablet (500 mg total) by mouth daily. X 10 days 10 tablet 0  . meclizine (ANTIVERT) 25 MG tablet Take 1 tablet by mouth every 6 hours as needed. 30 tablet 1  . Multiple Vitamins-Minerals (MULTIVITAMIN PO) Take 1 tablet by mouth daily.    . ondansetron (ZOFRAN-ODT) 8 MG disintegrating tablet Take 1 tablet by mouth every 8 hours as needed. 15 tablet 1  . pantoprazole (PROTONIX) 40 MG tablet Take 1 tablet (40 mg total) by mouth daily. 30 tablet 5  . zolpidem (AMBIEN) 10 MG tablet Take 1  tablet (10 mg total) by mouth at bedtime as needed for sleep. 30 tablet 2  . dicyclomine (BENTYL) 10 MG capsule Take 1 capsule (10 mg total) by mouth 4 (four) times daily -  before meals and at bedtime. For abdominal cramping and loose stool. 120 capsule 3  . Na Sulfate-K Sulfate-Mg Sulf (SUPREP BOWEL PREP KIT) 17.5-3.13-1.6 GM/180ML SOLN Take 1 kit by mouth as directed. 1 Bottle 0   No current facility-administered medications for this visit.     Allergies as of 04/14/2016 - Review Complete 04/14/2016  Allergen Reaction Noted  . Augmentin [amoxicillin-pot clavulanate] Hives 07/30/2015  . Shrimp [shellfish allergy] Shortness Of Breath 04/14/2016  . Latex Rash 06/29/2012    Family History  Problem Relation Age of Onset  . Hypertension Brother   . Diabetes Father   . Hypertension Father   . Cancer Father   . Colon cancer Neg Hx   . Colon polyps Neg Hx     Social History   Social History  . Marital status: Married    Spouse name: N/A  . Number of children: N/A  . Years of education: N/A   Social History Main Topics  . Smoking status: Former Smoker    Packs/day: 1.00    Years: 20.00    Types: Cigarettes    Quit date: 09/02/1988  . Smokeless tobacco: Never Used  . Alcohol use No  . Drug use: No  . Sexual activity: Yes    Birth control/ protection: None   Other Topics Concern  . None   Social History Narrative  . None    Review of Systems: As mentioned in HPI   Physical Exam: BP 140/83   Pulse 62   Temp 97.8 F (36.6 C) (Oral)   Ht _0  (1.803 m)   Wt 221 lb 3.2 oz (100.3 kg)   BMI 30.85 kg/m  General:   Alert and oriented. No distress noted. Pleasant and cooperative.  Head:  Normocephalic and atraumatic. Eyes:  Conjuctiva clear without scleral icterus. Mouth:  Oral mucosa pink and moist. . No lesions. Heart:  S1, S2 present without murmurs, rubs, or gallops. Regular rate and rhythm. Abdomen:  +BS, soft, very mild TTP suprapubic and non-distended. No  rebound or guarding. No HSM or masses noted. Msk:  Symmetrical without gross deformities. Normal posture. Extremities:  Without edema. Neurologic:  Alert and  oriented x4;  grossly normal neurologically. Skin:  Intact without significant lesions or rashes. Psych:  Alert and cooperative. Normal mood and affect.  Lab Results  Component Value Date   WBC 9.8 03/01/2016   HGB 16.3 12/16/2015   HCT 45.6 03/01/2016   MCV 88 03/01/2016   PLT 220 03/01/2016   Lab Results  Component Value Date   ALT 40 10/30/2015   AST 24 10/30/2015   ALKPHOS 99 10/30/2015   BILITOT 1.0  10/30/2015

## 2016-04-14 NOTE — Progress Notes (Signed)
cc'ed to pcp °

## 2016-04-14 NOTE — Patient Instructions (Signed)
I sent Bentyl to the pharmacy to take up to 4 times a day (before meals and at bedtime). This is for abdominal cramping and loose stool. Monitor for constipation, dry mouth.   We have scheduled you for a colonoscopy with Dr. Darrick PennaFields due to heme positive stool and change in bowel habits.

## 2016-04-20 ENCOUNTER — Other Ambulatory Visit: Payer: Self-pay

## 2016-04-20 ENCOUNTER — Telehealth: Payer: Self-pay

## 2016-04-20 MED ORDER — PEG 3350-KCL-NA BICARB-NACL 420 G PO SOLR
4000.0000 mL | ORAL | 0 refills | Status: DC
Start: 2016-04-20 — End: 2016-05-03

## 2016-04-20 NOTE — Telephone Encounter (Signed)
Pt called and said his insurance doesn't cover Suprep. Trilyte rx sent in to his pharmacy. New instructions mailed to pt.

## 2016-04-28 ENCOUNTER — Ambulatory Visit: Payer: BLUE CROSS/BLUE SHIELD | Admitting: Family Medicine

## 2016-05-04 NOTE — Patient Instructions (Signed)
Vernon Fisher  05/04/2016     @PREFPERIOPPHARMACY @   Your procedure is scheduled on  05/11/2016.  Report to [facility] at   645  A.M.  Call this number if you have problems the morning of surgery:  [phone]   Remember:  Do not eat food or drink liquids after midnight.  Take these medicines the morning of surgery with A SIP OF WATER  Vasotec, lexapro, norco, protonix.   Do not wear jewelry, make-up or nail polish.  Do not wear lotions, powders, or perfumes, or deoderant.  Do not shave 48 hours prior to surgery.  Men may shave face and neck.  Do not bring valuables to the hospital.  [facility] is not responsible for any belongings or valuables.  Contacts, dentures or bridgework may not be worn into surgery.  Leave your suitcase in the car.  After surgery it may be brought to your room.  For patients admitted to the hospital, discharge time will be determined by your treatment team.  Patients discharged the day of surgery will not be allowed to drive home.   Name and phone number of your driver:   Family Special instructions:  Follow the diet and prep instructions given to you by Dr Evelina Dun [facility].  Please read over the following fact sheets that you were given. Anesthesia Post-op Instructions and Care and Recovery After Surgery       Colonoscopy A colonoscopy is an exam to look at the entire large intestine (colon). This exam can help find problems such as tumors, polyps, inflammation, and areas of bleeding. The exam takes about 1 hour.  LET [facility] CARE PROVIDER KNOW ABOUT:   Any allergies you have.  All medicines you are taking, including vitamins, herbs, eye drops, creams, and over-the-counter medicines.  Previous problems you or members of your family have had with the use of anesthetics.  Any blood disorders you have.  Previous surgeries you have had.  Medical conditions you have. RISKS AND COMPLICATIONS  Generally, this is a safe  procedure. However, as with any procedure, complications can occur. Possible complications include:  Bleeding.  Tearing or rupture of the colon wall.  Reaction to medicines given during the exam.  Infection (rare). BEFORE THE PROCEDURE   Ask your health care provider about changing or stopping your regular medicines.  You may be prescribed an oral bowel prep. This involves drinking a large amount of medicated liquid, starting the day before your procedure. The liquid will cause you to have multiple loose stools until your stool is almost clear or light green. This cleans out your colon in preparation for the procedure.  Do not eat or drink anything else once you have started the bowel prep, unless your health care provider tells you it is safe to do so.  Arrange for someone to drive you home after the procedure. PROCEDURE   You will be given medicine to help you relax (sedative).  You will lie on your side with your knees bent.  A long, flexible tube with a light and camera on the end (colonoscope) will be inserted through the rectum and into the colon. The camera sends video back to a computer screen as it moves through the colon. The colonoscope also releases carbon dioxide gas to inflate the colon. This helps your health care provider see the area better.  During the exam, your health care provider may take a small tissue sample (biopsy) to  be examined under a microscope if any abnormalities are found.  The exam is finished when the entire colon has been viewed. AFTER THE PROCEDURE   Do not drive for 24 hours after the exam.  You may have a small amount of blood in your stool.  You may pass moderate amounts of gas and have mild abdominal cramping or bloating. This is caused by the gas used to inflate your colon during the exam.  Ask when your test results will be ready and how you will get your results. Make sure you get your test results.   This information is not intended  to replace advice given to you by your health care provider. Make sure you discuss any questions you have with your health care provider.   Document Released: 06/18/2000 Document Revised: 04/11/2013 Document Reviewed: 02/26/2013 Elsevier Interactive Patient Education 2016 Elsevier Inc[employer] Colonoscopy, Care After Refer to this sheet in the next few weeks. These instructions provide you with information on caring for yourself after your procedure. Your health care provider may also give you more specific instructions. Your treatment has been planned according to current medical practices, but problems sometimes occur. Call your health care provider if you have any problems or questions after your procedure. WHAT TO EXPECT AFTER THE PROCEDURE  After your procedure, it is typical to have the following:  A small amount of blood in your stool.  Moderate amounts of gas and mild abdominal cramping or bloating. HOME CARE INSTRUCTIONS  Do not drive, operate machinery, or sign important documents for 24 hours.  You may shower and resume your regular physical activities, but move at a slower pace for the first 24 hours.  Take frequent rest periods for the first 24 hours.  Walk around or put a warm pack on your abdomen to help reduce abdominal cramping and bloating.  Drink enough fluids to keep your urine clear or pale yellow.  You may resume your normal diet as instructed by your health care provider. Avoid heavy or fried foods that are hard to digest.  Avoid drinking alcohol for 24 hours or as instructed by your health care provider.  Only take over-the-counter or prescription medicines as directed by your health care provider.  If a tissue sample (biopsy) was taken during your procedure:  Do not take aspirin or blood thinners for 7 days, or as instructed by your health care provider.  Do not drink alcohol for 7 days, or as instructed by your health care provider.  Eat soft foods for the first  24 hours. SEEK MEDICAL CARE IF: You have persistent spotting of blood in your stool 2-3 days after the procedure. SEEK IMMEDIATE MEDICAL CARE IF:  You have more than a small spotting of blood in your stool.  You pass large blood clots in your stool.  Your abdomen is swollen (distended).  You have nausea or vomiting.  You have a fever.  You have increasing abdominal pain that is not relieved with medicine.   This information is not intended to replace advice given to you by your health care provider. Make sure you discuss any questions you have with your health care provider.   Document Released: 02/03/2004 Document Revised: 04/11/2013 Document Reviewed: 02/26/2013 Elsevier Interactive Patient Education 2016 Elsevier Inc[employer] Monitored Anesthesia Care Monitored anesthesia care is an anesthesia service for a medical procedure. Anesthesia is the loss of the ability to feel pain. It is produced by medicines called anesthetics. It may affect a small area of your  body (local anesthesia), a large area of your body (regional anesthesia), or your entire body (general anesthesia). The need for monitored anesthesia care depends your procedure, your condition, and the potential need for regional or general anesthesia. It is often provided during procedures where:   General anesthesia may be needed if there are complications. This is because you need special care when you are under general anesthesia.   You will be under local or regional anesthesia. This is so that you are able to have higher levels of anesthesia if needed.   You will receive calming medicines (sedatives). This is especially the case if sedatives are given to put you in a semi-conscious state of relaxation (deep sedation). This is because the amount of sedative needed to produce this state can be hard to predict. Too much of a sedative can produce general anesthesia. Monitored anesthesia care is performed by one or more health care  providers who have special training in all types of anesthesia. You will need to meet with these health care providers before your procedure. During this meeting, they will ask you about your medical history. They will also give you instructions to follow. (For example, you will need to stop eating and drinking before your procedure. You may also need to stop or change medicines you are taking.) During your procedure, your health care providers will stay with you. They will:   Watch your condition. This includes watching your blood pressure, breathing, and level of pain.   Diagnose and treat problems that occur.   Give medicines if they are needed. These may include calming medicines (sedatives) and anesthetics.   Make sure you are comfortable.  Having monitored anesthesia care does not necessarily mean that you will be under anesthesia. It does mean that your health care providers will be able to manage anesthesia if you need it or if it occurs. It also means that you will be able to have a different type of anesthesia than you are having if you need it. When your procedure is complete, your health care providers will continue to watch your condition. They will make sure any medicines wear off before you are allowed to go home.    This information is not intended to replace advice given to you by your health care provider. Make sure you discuss any questions you have with your health care provider.   Document Released: 03/17/2005 Document Revised: 07/12/2014 Document Reviewed: 08/02/2012 Elsevier Interactive Patient Education 2016 Elsevier Inc. PATIENT INSTRUCTIONS POST-ANESTHESIA  IMMEDIATELY FOLLOWING SURGERY:  Do not drive or operate machinery for the first twenty four hours after surgery.  Do not make any important decisions for twenty four hours after surgery or while taking narcotic pain medications or sedatives.  If you develop intractable nausea and vomiting or a severe headache  please notify your doctor immediately.  FOLLOW-UP:  Please make an appointment with your surgeon as instructed. You do not need to follow up with anesthesia unless specifically instructed to do so.  WOUND CARE INSTRUCTIONS (if applicable):  Keep a dry clean dressing on the anesthesia/puncture wound site if there is drainage.  Once the wound has quit draining you may leave it open to air.  Generally you should leave the bandage intact for twenty four hours unless there is drainage.  If the epidural site drains for more than 36-48 hours please call the anesthesia department.  QUESTIONS?:  Please feel free to call your physician or the hospital operator if you have any questions,  and they will be happy to assist you.

## 2016-05-06 ENCOUNTER — Encounter (HOSPITAL_COMMUNITY)
Admission: RE | Admit: 2016-05-06 | Discharge: 2016-05-06 | Disposition: A | Discharge status: Home / Self Care | Payer: BLUE CROSS/BLUE SHIELD | Source: Ambulatory Visit | Attending: Gastroenterology | Admitting: Gastroenterology

## 2016-05-06 ENCOUNTER — Encounter (HOSPITAL_COMMUNITY): Payer: Self-pay

## 2016-05-06 DIAGNOSIS — Z01812 Encounter for preprocedural laboratory examination: Secondary | ICD-10-CM | POA: Insufficient documentation

## 2016-05-06 DIAGNOSIS — R195 Other fecal abnormalities: Secondary | ICD-10-CM | POA: Diagnosis not present

## 2016-05-06 LAB — BASIC METABOLIC PANEL
ANION GAP: 6 (ref 5–15)
BUN: 12 mg/dL (ref 6–20)
CALCIUM: 8.7 mg/dL — AB (ref 8.9–10.3)
CO2: 27 mmol/L (ref 22–32)
CREATININE: 0.74 mg/dL (ref 0.61–1.24)
Chloride: 105 mmol/L (ref 101–111)
Glucose, Bld: 124 mg/dL — ABNORMAL HIGH (ref 65–99)
Potassium: 3.6 mmol/L (ref 3.5–5.1)
SODIUM: 138 mmol/L (ref 135–145)

## 2016-05-06 LAB — CBC WITH DIFFERENTIAL/PLATELET
BASOS ABS: 0 10*3/uL (ref 0.0–0.1)
BASOS PCT: 0 %
EOS ABS: 0.2 10*3/uL (ref 0.0–0.7)
Eosinophils Relative: 2 %
HCT: 47.1 % (ref 39.0–52.0)
HEMOGLOBIN: 16.4 g/dL (ref 13.0–17.0)
Lymphocytes Relative: 32 %
Lymphs Abs: 2.6 10*3/uL (ref 0.7–4.0)
MCH: 30.8 pg (ref 26.0–34.0)
MCHC: 34.8 g/dL (ref 30.0–36.0)
MCV: 88.5 fL (ref 78.0–100.0)
MONOS PCT: 8 %
Monocytes Absolute: 0.7 10*3/uL (ref 0.1–1.0)
NEUTROS PCT: 58 %
Neutro Abs: 4.8 10*3/uL (ref 1.7–7.7)
Platelets: 169 10*3/uL (ref 150–400)
RBC: 5.32 MIL/uL (ref 4.22–5.81)
RDW: 12.5 % (ref 11.5–15.5)
WBC: 8.3 10*3/uL (ref 4.0–10.5)

## 2016-05-11 ENCOUNTER — Encounter (HOSPITAL_COMMUNITY): Payer: Self-pay | Admitting: *Deleted

## 2016-05-11 ENCOUNTER — Ambulatory Visit (HOSPITAL_COMMUNITY): Payer: BLUE CROSS/BLUE SHIELD | Admitting: Anesthesiology

## 2016-05-11 ENCOUNTER — Telehealth: Payer: Self-pay | Admitting: Gastroenterology

## 2016-05-11 ENCOUNTER — Ambulatory Visit (HOSPITAL_COMMUNITY)
Admission: RE | Admit: 2016-05-11 | Discharge: 2016-05-11 | Disposition: A | Discharge status: Home / Self Care | Payer: BLUE CROSS/BLUE SHIELD | Source: Ambulatory Visit | Attending: Gastroenterology | Admitting: Gastroenterology

## 2016-05-11 ENCOUNTER — Other Ambulatory Visit: Payer: Self-pay

## 2016-05-11 ENCOUNTER — Encounter (HOSPITAL_COMMUNITY)
Admission: RE | Disposition: A | Discharge status: Home / Self Care | Payer: Self-pay | Source: Ambulatory Visit | Attending: Gastroenterology

## 2016-05-11 DIAGNOSIS — I10 Essential (primary) hypertension: Secondary | ICD-10-CM | POA: Diagnosis not present

## 2016-05-11 DIAGNOSIS — F329 Major depressive disorder, single episode, unspecified: Secondary | ICD-10-CM | POA: Diagnosis not present

## 2016-05-11 DIAGNOSIS — R1011 Right upper quadrant pain: Secondary | ICD-10-CM | POA: Insufficient documentation

## 2016-05-11 DIAGNOSIS — Z87891 Personal history of nicotine dependence: Secondary | ICD-10-CM | POA: Insufficient documentation

## 2016-05-11 DIAGNOSIS — K644 Residual hemorrhoidal skin tags: Secondary | ICD-10-CM | POA: Diagnosis not present

## 2016-05-11 DIAGNOSIS — R197 Diarrhea, unspecified: Secondary | ICD-10-CM | POA: Insufficient documentation

## 2016-05-11 DIAGNOSIS — K219 Gastro-esophageal reflux disease without esophagitis: Secondary | ICD-10-CM | POA: Insufficient documentation

## 2016-05-11 DIAGNOSIS — K921 Melena: Secondary | ICD-10-CM

## 2016-05-11 DIAGNOSIS — M797 Fibromyalgia: Secondary | ICD-10-CM | POA: Diagnosis not present

## 2016-05-11 DIAGNOSIS — E119 Type 2 diabetes mellitus without complications: Secondary | ICD-10-CM | POA: Diagnosis not present

## 2016-05-11 DIAGNOSIS — R1031 Right lower quadrant pain: Secondary | ICD-10-CM

## 2016-05-11 DIAGNOSIS — K648 Other hemorrhoids: Secondary | ICD-10-CM

## 2016-05-11 DIAGNOSIS — Z79899 Other long term (current) drug therapy: Secondary | ICD-10-CM | POA: Insufficient documentation

## 2016-05-11 HISTORY — PX: COLONOSCOPY WITH PROPOFOL: SHX5780

## 2016-05-11 HISTORY — PX: BIOPSY: SHX5522

## 2016-05-11 LAB — GLUCOSE, CAPILLARY: GLUCOSE-CAPILLARY: 96 mg/dL (ref 65–99)

## 2016-05-11 SURGERY — COLONOSCOPY WITH PROPOFOL
Anesthesia: Monitor Anesthesia Care

## 2016-05-11 MED ORDER — MIDAZOLAM HCL 2 MG/2ML IJ SOLN
1.0000 mg | INTRAMUSCULAR | Status: DC | PRN
  Administered 2016-05-11: 2 mg via INTRAVENOUS

## 2016-05-11 MED ORDER — PROPOFOL 500 MG/50ML IV EMUL
INTRAVENOUS | Status: DC | PRN
  Administered 2016-05-11: 125 ug/kg/min via INTRAVENOUS

## 2016-05-11 MED ORDER — CHLORHEXIDINE GLUCONATE CLOTH 2 % EX PADS
6.0000 | MEDICATED_PAD | Freq: Once | CUTANEOUS | Status: DC
Start: 2016-05-11 — End: 2016-05-11

## 2016-05-11 MED ORDER — DICYCLOMINE HCL 10 MG PO CAPS: ORAL_CAPSULE | ORAL | 11 refills | Status: DC

## 2016-05-11 MED ORDER — FENTANYL CITRATE (PF) 100 MCG/2ML IJ SOLN
INTRAMUSCULAR | Status: AC
  Filled 2016-05-11: qty 2

## 2016-05-11 MED ORDER — CHLORHEXIDINE GLUCONATE CLOTH 2 % EX PADS: 6.0000 | MEDICATED_PAD | Freq: Once | CUTANEOUS | Status: DC

## 2016-05-11 MED ORDER — PROPOFOL 10 MG/ML IV BOLUS
INTRAVENOUS | Status: AC
  Filled 2016-05-11: qty 40

## 2016-05-11 MED ORDER — FENTANYL CITRATE (PF) 100 MCG/2ML IJ SOLN
25.0000 ug | INTRAMUSCULAR | Status: AC | PRN
  Administered 2016-05-11 (×2): 25 ug via INTRAVENOUS

## 2016-05-11 MED ORDER — MIDAZOLAM HCL 2 MG/2ML IJ SOLN
INTRAMUSCULAR | Status: AC
  Filled 2016-05-11: qty 2

## 2016-05-11 MED ORDER — LACTATED RINGERS IV SOLN
INTRAVENOUS | Status: DC
  Administered 2016-05-11: 1000 mL via INTRAVENOUS

## 2016-05-11 NOTE — Telephone Encounter (Signed)
Rae Lipsracey Green, RN from Short Stay called to make pp fu in 4 months and said that the patient needed a complete CT abd/pelvis within the next 7 days

## 2016-05-11 NOTE — Anesthesia Postprocedure Evaluation (Signed)
Anesthesia Post Note  Patient: Eveline KetoRobert L Slabaugh  Procedure(s) Performed: Procedure(s) (LRB): COLONOSCOPY WITH PROPOFOL (N/A) BIOPSY  Patient location during evaluation: PACU Anesthesia Type: MAC Level of consciousness: awake and alert and oriented Pain management: pain level controlled Vital Signs Assessment: post-procedure vital signs reviewed and stable Respiratory status: spontaneous breathing Cardiovascular status: blood pressure returned to baseline Postop Assessment: no signs of nausea or vomiting Anesthetic complications: no    Last Vitals:  Vitals:   05/11/16 0810 05/11/16 0902  BP: 121/80 123/77  Pulse:  (!) 47  Resp: 14 17  Temp:  (P) 36.6 C    Last Pain:  Vitals:   05/11/16 0830  TempSrc:   PainSc: 2                  Darrin Koman

## 2016-05-11 NOTE — H&P (Signed)
Primary Care Physician:  Lubertha SouthSteve Luking, MD Primary Gastroenterologist:  Dr. Darrick PennaFields  Pre-Procedure History & Physical: HPI:  Vernon KetoRobert L Fisher is a 60 y.o. male here for HEME POSITIVE STOOLS/DIARRHEA.  Past Medical History:  Diagnosis Date  . Anxiety   . Chronic pain   . Degenerative disc disease   . Depression   . Diabetes mellitus without complication (HCC)    diet controlled  . Fibromyalgia   . GERD (gastroesophageal reflux disease)   . Hypertension   . Migraine headache   . Plantar fasciitis   . PONV (postoperative nausea and vomiting)   . Reflux     Past Surgical History:  Procedure Laterality Date  . bilateral carpal tunnel release    . BIOPSY  12/23/2015   Procedure: BIOPSY;  Surgeon: West BaliSandi L Karis Rilling, MD;  Location: [facility];  Service: Endoscopy;;  duodenal bx gastric bx esophageal bx  . CERVICAL FUSION     plates and screws  . COLONOSCOPY  06/08/2011   Dr. Darrick PennaFields: normal. Repeat in 10 years  . ESOPHAGOGASTRODUODENOSCOPY (EGD) WITH PROPOFOL N/A 12/23/2015   Dr. Darrick PennaFields: moderate Schatzki's ring s/p dilation, small hiatal hernia, benign small bowel biopsies, negative for EOE, mild gastritis  . KNEE ARTHROSCOPY WITH MEDIAL MENISECTOMY Left 09/06/2014   Procedure: KNEE ARTHROSCOPY WITH LIMITED DEBRIDEMENT;  Surgeon: Vickki HearingStanley E Harrison, MD;  Location: [facility];  Service: Orthopedics;  Laterality: Left;  . NOSE SURGERY     repair of fracture and deviated septum from fracture  . Right Rotator Cuff repair    . SAVORY DILATION N/A 12/23/2015   Procedure: SAVORY DILATION;  Surgeon: West BaliSandi L Darlin Stenseth, MD;  Location: [facility];  Service: Endoscopy;  Laterality: N/A;    Prior to Admission medications   Medication Sig Start Date End Date Taking? Authorizing Provider  enalapril (VASOTEC) 10 MG tablet Take 1 tablet (10 mg total) by mouth daily. 03/22/16  Yes Merlyn AlbertWilliam S Luking, MD  escitalopram (LEXAPRO) 10 MG tablet Take 1 tablet (10 mg total) by mouth daily. 03/22/16  Yes Merlyn AlbertWilliam S  Luking, MD  HYDROcodone-acetaminophen (NORCO/VICODIN) 5-325 MG tablet Take 1 tablet by mouth every 8 (eight) hours as needed for moderate pain. 10/31/15  Yes Vickki HearingStanley E Harrison, MD  Multiple Vitamins-Minerals (MULTIVITAMIN PO) Take 1 tablet by mouth daily.   Yes Historical Provider, MD  pantoprazole (PROTONIX) 40 MG tablet Take 1 tablet (40 mg total) by mouth daily. 03/22/16  Yes Merlyn AlbertWilliam S Luking, MD  zolpidem (AMBIEN) 10 MG tablet Take 1 tablet (10 mg total) by mouth at bedtime as needed for sleep. 03/22/16 05/03/16 Yes Merlyn AlbertWilliam S Luking, MD    Allergies as of 04/14/2016 - Review Complete 04/14/2016  Allergen Reaction Noted  . Augmentin [amoxicillin-pot clavulanate] Hives 07/30/2015  . Shrimp [shellfish allergy] Shortness Of Breath 04/14/2016  . Latex Rash 06/29/2012    Family History  Problem Relation Age of Onset  . Hypertension Brother   . Diabetes Father   . Hypertension Father   . Cancer Father   . Colon cancer Neg Hx   . Colon polyps Neg Hx     Social History   Social History  . Marital status: Married    Spouse name: N/A  . Number of children: N/A  . Years of education: N/A   Occupational History  . Not on file.   Social History Main Topics  . Smoking status: Former Smoker    Packs/day: 1.00    Years: 20.00    Types: Cigarettes  Quit date: 09/02/1988  . Smokeless tobacco: Never Used  . Alcohol use No  . Drug use: No  . Sexual activity: Yes    Birth control/ protection: None   Other Topics Concern  . Not on file   Social History Narrative  . No narrative on file    Review of Systems: See HPI, otherwise negative ROS   Physical Exam: BP 132/84   Pulse 61   Temp 97.6 F (36.4 C) (Oral)   Resp 18   SpO2 96%  General:   Alert,  pleasant and cooperative in NAD Head:  Normocephalic and atraumatic. Neck:  Supple; Lungs:  Clear throughout to auscultation.    Heart:  Regular rate and rhythm. Abdomen:  Soft, nontender and nondistended. Normal bowel  sounds, without guarding, and without rebound.   Neurologic:  Alert and  oriented x4;  grossly normal neurologically.  Impression/Plan:     HEME POS STOOLS  PLAN:  1.TCS TODAY. DISCUSSED PROCEDURE, BENEFITS, & RISKS: < 1% chance of medication reaction, bleeding, perforation, or rupture of spleen/liver.

## 2016-05-11 NOTE — Telephone Encounter (Signed)
COMPLETE CT SCAN OF THE ABDOMEN AND PELVIS WITHIN THE NEXT 7 DAYS, Dx: RLQ ABDOMINAL PAIN AND DIARRHEA.

## 2016-05-11 NOTE — Telephone Encounter (Signed)
Pt is set up for 05/18/16 @ 8:00. I have put a letter in the mail with appointment date and time. I also left a message on his voice mail to call us.

## 2016-05-11 NOTE — Transfer of Care (Signed)
Immediate Anesthesia Transfer of Care Note  Patient: Vernon KetoRobert L Fisher  Procedure(s) Performed: Procedure(s) with comments: COLONOSCOPY WITH PROPOFOL (N/A) - 815 BIOPSY  Patient Location: PACU  Anesthesia Type:MAC  Level of Consciousness: awake and alert   Airway & Oxygen Therapy: Patient Spontanous Breathing and Patient connected to nasal cannula oxygen  Post-op Assessment: Report given to RN  Post vital signs: Reviewed and stable  Last Vitals:  Vitals:   05/11/16 0810 05/11/16 0902  BP: 121/80 123/77  Pulse:  (!) 47  Resp: 14 17  Temp:  (P) 36.6 C    Last Pain:  Vitals:   05/11/16 0830  TempSrc:   PainSc: 2       Patients Stated Pain Goal: 10 (05/11/16 0805)  Complications: No apparent anesthesia complications

## 2016-05-11 NOTE — Op Note (Addendum)
[facility] Patient Name: Vernon LasterRobert Crandall Procedure Date: 05/11/2016 8:13 AM MRN: [id number] Date of Birth: 10-05-1955 Attending MD: Jonette EvaSandi Fields , MD CSN: [id number] Age: 60 Admit Type: Outpatient Procedure:                Colonoscopy with RANDOM COLD FORCEPS BIOPSY Indications:              Abdominal pain in the right lower quadrant,                            Abdominal pain in the right upper quadrant,                            Clinically significant diarrhea of unexplained                            origin on LEXAPRO. Providers:                Jonette EvaSandi Fields, MD, Criselda PeachesLurae B. Patsy LagerAlbert RN, RN, Birder Robsonebra                            Houghton, Technician Referring MD:             Simone CuriaStephen Luking, MD Medicines:                Propofol per Anesthesia Complications:            No immediate complications. Estimated Blood Loss:     Estimated blood loss was minimal. Procedure:                Pre-Anesthesia Assessment:                           - Prior to the procedure, a History and Physical                            was performed, and patient medications and                            allergies were reviewed. The patient's tolerance of                            previous anesthesia was also reviewed. The risks                            and benefits of the procedure and the sedation                            options and risks were discussed with the patient.                            All questions were answered, and informed consent                            was obtained. Prior Anticoagulants: The patient has  taken no previous anticoagulant or antiplatelet                            agents. ASA Grade Assessment: II - A patient with                            mild systemic disease. After reviewing the risks                            and benefits, the patient was deemed in                            satisfactory condition to undergo the procedure.                             After obtaining informed consent, the colonoscope                            was passed under direct vision. Throughout the                            procedure, the patient's blood pressure, pulse, and                            oxygen saturations were monitored continuously. The                            Colonoscope was introduced through the anus and                            advanced to the 10 cm into the ileum. The terminal                            ileum, ileocecal valve, appendiceal orifice, and                            rectum were photographed. The colonoscopy was                            performed without difficulty. The patient tolerated                            the procedure well. The quality of the bowel                            preparation was excellent. Scope In: 8:42:32 AM Scope Out: 8:55:33 AM Scope Withdrawal Time: 0 hours 10 minutes 15 seconds  Total Procedure Duration: 0 hours 13 minutes 1 second  Findings:      The entire examined colon appeared normal. Biopsies for histology were       taken with a cold forceps from the cecum, ascending colon, transverse       colon, descending colon and sigmoid colon for evaluation of microscopic       colitis.  The terminal ileum appeared normal.      Non-bleeding internal hemorrhoids were found. The hemorrhoids were       moderate.      External hemorrhoids were found. The hemorrhoids were small. Impression:               - The entire examined colon is normal. NO SOURCE                            FOR DIARRHEA/rlq abdominal paon IDENTIFIED. THe                            DIFFERENTIAL DIAGNOSIS INCLUDES gu source(urinary                            retention/prostatitis), FUNCTIONAL DIARRHEA, SIBO,                            MEDICATION SIDE EFFECT: LEXAPRO, AND LESS LIKELY                            IBS-D, OCCULT IBD OR EOSINOPHILIC COLITIS.                           - The examined portion of the ileum was  normal.                           - Non-bleeding internal hemorrhoids. Moderate Sedation:      Per Anesthesia Care Recommendation:           - High fiber diet and lactose free diet.                           - Continue present medications. MAY NEED TO CHANGE                            LEXAPRO TO PAXIL TO SEE IF DIARRHEA GETS BETTER.                           - Await pathology results.                           - Repeat colonoscopy in 10 years for surveillance.                           - Return to GI office in 4 months. SEE UROLOGY TO                            MANAGE PROSTATE.                           -CT ABD/PELVIS TO COMPLETE EVALUATION FOR ABDOMINAL                            PAIN/DIARRHEA.                           -  Patient has a contact number available for                            emergencies. The signs and symptoms of potential                            delayed complications were discussed with the                            patient. Return to normal activities tomorrow.                            Written discharge instructions were provided to the                            patient. Procedure Code(s):        --- Professional ---                           [id number], Colonoscopy, flexible; with biopsy, single                            or multiple Diagnosis Code(s):        --- Professional ---                           K64.8, Other hemorrhoids                           R10.31, Right lower quadrant pain                           R10.11, Right upper quadrant pain                           R19.7, Diarrhea, unspecified CPT copyright 2016 American Medical Association. All rights reserved. The codes documented in this report are preliminary and upon coder review may  be revised to meet current compliance requirements. Jonette Eva, MD Jonette Eva, MD 05/11/2016 9:20:19 AM This report has been signed electronically. Number of Addenda: 0

## 2016-05-11 NOTE — Discharge Instructions (Signed)
You have internal hemorrhoids. YOU DID NOT HAVE ANY POLYPS. THE LAST PART OF YOU SMALL BOWEL IS NORMAL. I BIOPSIED YOUR COLON.  DRINK WATER TO KEEP YOUR URINE LIGHT YELLOW.  FOLLOW A HIGH FIBER/LACTOSE FREE DIET. AVOID ITEMS THAT CAUSE BLOATING. SEE INFO BELOW.  START DICYCLOMINE 30 MINUTES PRIOR TO BREAKFAST AND AT BEDTIME TO REDUCE DIARRHEA AND ABDOMINAL PAIN. IT MAY CAUSE DROWSINESS, DRY EYES/MOUTH, BLURRY VISION, OR DIFFICULTY URINATING.  COMPLETE CT SCAN OF THE ABDOMEN AND PELVIS WITHIN THE NEXT 7 DAYS.  USE PREPARATION H FOUR TIMES  A DAY IF NEEDED TO RELIEVE RECTAL PAIN/PRESSURE/BLEEDING.  PLEASE CALL WITH QUESTIONS OR CONCERNS.  SEE UROLOGY TO EVALUATE YOUR PROSTATE SINCE YOU ARE UP SEVERAL TIMES A NIGHT TO URINATE.   FOLLOW UP IN 4 MOS.   Next colonoscopy in 10 years. Colonoscopy Care After Read the instructions outlined below and refer to this sheet in the next week. These discharge instructions provide you with general information on caring for yourself after you leave the hospital. While your treatment has been planned according to the most current medical practices available, unavoidable complications occasionally occur. If you have any problems or questions after discharge, call DR. Bhavin Monjaraz, [phone].  ACTIVITY  You may resume your regular activity, but move at a slower pace for the next 24 hours.   Take frequent rest periods for the next 24 hours.   Walking will help get rid of the air and reduce the bloated feeling in your belly (abdomen).   No driving for 24 hours (because of the medicine (anesthesia) used during the test).   You may shower.   Do not sign any important legal documents or operate any machinery for 24 hours (because of the anesthesia used during the test).    NUTRITION  Drink plenty of fluids.   You may resume your normal diet as instructed by your doctor.   Begin with a light meal and progress to your normal diet. Heavy or fried foods  are harder to digest and may make you feel sick to your stomach (nauseated).   Avoid alcoholic beverages for 24 hours or as instructed.    MEDICATIONS  You may resume your normal medications.   WHAT YOU CAN EXPECT TODAY  Some feelings of bloating in the abdomen.   Passage of more gas than usual.   Spotting of blood in your stool or on the toilet paper  .  IF YOU HAD POLYPS REMOVED DURING THE COLONOSCOPY:  Eat a soft diet IF YOU HAVE NAUSEA, BLOATING, ABDOMINAL PAIN, OR VOMITING.    FINDING OUT THE RESULTS OF YOUR TEST Not all test results are available during your visit. DR. Darrick PennaFIELDS WILL CALL YOU WITHIN 14 DAYS OF YOUR PROCEDUE WITH YOUR RESULTS. Do not assume everything is normal if you have not heard from DR. Millie Forde, CALL HER OFFICE AT [phone].  SEEK IMMEDIATE MEDICAL ATTENTION AND CALL THE OFFICE: [phone] IF:  You have more than a spotting of blood in your stool.   Your belly is swollen (abdominal distention).   You are nauseated or vomiting.   You have a temperature over 101F.   You have abdominal pain or discomfort that is severe or gets worse throughout the day.  High-Fiber Diet A high-fiber diet changes your normal diet to include more whole grains, legumes, fruits, and vegetables. Changes in the diet involve replacing refined carbohydrates with unrefined foods. The calorie level of the diet is essentially unchanged. The Dietary Reference Intake (recommended amount) for  adult males is 38 grams per day. For adult females, it is 25 grams per day. Pregnant and lactating women should consume 28 grams of fiber per day. Fiber is the intact part of a plant that is not broken down during digestion. Functional fiber is fiber that has been isolated from the plant to provide a beneficial effect in the body.  PURPOSE  Increase stool bulk.   Ease and regulate bowel movements.   Lower cholesterol.   REDUCE RISK OF COLON CANCER  INDICATIONS THAT YOU NEED MORE  FIBER  Constipation and hemorrhoids.   Uncomplicated diverticulosis (intestine condition) and irritable bowel syndrome.   Weight management.   As a protective measure against hardening of the arteries (atherosclerosis), diabetes, and cancer.   GUIDELINES FOR INCREASING FIBER IN THE DIET  Start adding fiber to the diet slowly. A gradual increase of about 5 more grams (2 slices of whole-wheat bread, 2 servings of most fruits or vegetables, or 1 bowl of high-fiber cereal) per day is best. Too rapid an increase in fiber may result in constipation, flatulence, and bloating.   Drink enough water and fluids to keep your urine clear or pale yellow. Water, juice, or caffeine-free drinks are recommended. Not drinking enough fluid may cause constipation.   Eat a variety of high-fiber foods rather than one type of fiber.   Try to increase your intake of fiber through using high-fiber foods rather than fiber pills or supplements that contain small amounts of fiber.   The goal is to change the types of food eaten. Do not supplement your present diet with high-fiber foods, but replace foods in your present diet.   INCLUDE A VARIETY OF FIBER SOURCES  Replace refined and processed grains with whole grains, canned fruits with fresh fruits, and incorporate other fiber sources. White rice, white breads, and most bakery goods contain little or no fiber.   Brown whole-grain rice, buckwheat oats, and many fruits and vegetables are all good sources of fiber. These include: broccoli, Brussels sprouts, cabbage, cauliflower, beets, sweet potatoes, white potatoes (skin on), carrots, tomatoes, eggplant, squash, berries, fresh fruits, and dried fruits.   Cereals appear to be the richest source of fiber. Cereal fiber is found in whole grains and bran. Bran is the fiber-rich outer coat of cereal grain, which is largely removed in refining. In whole-grain cereals, the bran remains. In breakfast cereals, the largest  amount of fiber is found in those with "bran" in their names. The fiber content is sometimes indicated on the label.   You may need to include additional fruits and vegetables each day.   In baking, for 1 cup white flour, you may use the following substitutions:   1 cup whole-wheat flour minus 2 tablespoons.   1/2 cup white flour plus 1/2 cup whole-wheat flour.    Lactose Free Diet Lactose is a carbohydrate that is found mainly in milk and milk products, as well as in foods with added milk or whey. Lactose must be digested by the enzyme in order to be used by the body. Lactose intolerance occurs when there is a shortage of lactase. When your body is not able to digest lactose, you may feel sick to your stomach (nausea), bloating, cramping, gas and diarrhea.  There are many dairy products that may be tolerated better than milk by some people:  The use of cultured dairy products such as yogurt, buttermilk, cottage cheese, and sweet acidophilus milk (Kefir) for lactase-deficient individuals is usually well tolerated. This is  because the healthy bacteria help digest lactose.   Lactose-hydrolyzed milk (Lactaid) contains 40-90% less lactose than milk and may also be well tolerated.    SPECIAL NOTES  Lactose is a carbohydrates. The major food source is dairy products. Reading food labels is important. Many products contain lactose even when they are not made from milk. Look for the following words: whey, milk solids, dry milk solids, nonfat dry milk powder. Typical sources of lactose other than dairy products include breads, candies, cold cuts, prepared and processed foods, and commercial sauces and gravies.   All foods must be prepared without milk, cream, or other dairy foods.   Soy milk and lactose-free supplements (LACTASE) may be used as an alternative to milk.   FOOD GROUP ALLOWED/RECOMMENDED AVOID/USE SPARINGLY  BREADS / STARCHES 4 servings or more* Breads and rolls made without milk.  J[place], [place], or [place] bread. Breads and rolls that contain milk. Prepared mixes such as muffins, biscuits, waffles, pancakes. Sweet rolls, donuts, [place] toast (if made with milk or lactose).  Crackers: Soda crackers, graham crackers. Any crackers prepared without lactose. Zwieback crackers, corn curls, or any that contain lactose.  Cereals: Cooked or dry cereals prepared without lactose (read labels). Cooked or dry cereals prepared with lactose (read labels). Total, Cocoa Krispies. Special K.  Potatoes / Pasta / Rice: Any prepared without milk or lactose. Popcorn. Instant potatoes, frozen Jamaica[place] fries, scalloped or au gratin potatoes.  VEGETABLES 2 servings or more Fresh, frozen, and canned vegetables. Creamed or breaded vegetables. Vegetables in a cheese sauce or with lactose-containing margarines.  FRUIT 2 servings or more All fresh, canned, or frozen fruits that are not processed with lactose. Any canned or frozen fruits processed with lactose.  MEAT & SUBSTITUTES 2 servings or more (4 to 6 oz. total per day) Plain beef, chicken, fish, Malaw[place]urkey, lamb, veal, pork, or ham. Kosher prepared meat products. Strained or junior meats that do not contain milk. Eggs, soy meat substitutes, nuts. Scrambled eggs, omelets, and souffles that contain milk. Creamed or breaded meat, fish, or fowl. Sausage products such as wieners, liver sausage, or cold cuts that contain milk solids. Cheese, cottage cheese, or cheese spreads.  MILK None. (See BEVERAGES for milk substitutes. See DESSERTS for ice cream and frozen desserts.) Milk (whole, 2%, skim, or chocolate). Evaporated, powdered, or condensed milk; malted milk.  SOUPS & COMBINATION FOODS Bouillon, broth, vegetable soups, clear soups, consomms. Homemade soups made with allowed ingredients. Combination or prepared foods that do not contain milk or milk products (read labels). Cream soups, chowders, commercially prepared soups containing lactose. Macaroni and  cheese, pizza. Combination or prepared foods that contain milk or milk products.  DESSERTS & SWEETS In moderation Water and fruit ices; gelatin; angel food cake. Homemade cookies, pies, or cakes made from allowed ingredients. Pudding (if made with water or a milk substitute). Lactose-free tofu desserts. Sugar, honey, corn syrup, jam, jelly; marmalade; molasses (beet sugar); Pure sugar candy; marshmallows. Ice cream, ice milk, sherbet, custard, pudding, frozen yogurt. Commercial cake and cookie mixes. Desserts that contain chocolate. Pie crust made with milk-containing margarine; reduced-calorie desserts made with a sugar substitute that contains lactose. Toffee, peppermint, butterscotch, chocolate, caramels.  FATS & OILS In moderation Butter (as tolerated; contains very small amounts of lactose). Margarines and dressings that do not contain milk, Vegetable oils, shortening, Miracle Whip, mayonnaise, nondairy cream & whipped toppings without lactose or milk solids added (examples: Coffee Rich, Carnation Coffeemate, Rich's Whipped Topping, PolyRich). Tomasa BlaseBacon. Margarines and salad dressings containing  milk; cream, cream cheese; peanut butter with added milk solids, sour cream, chip dips, made with sour cream.  BEVERAGES Carbonated drinks; tea; coffee and freeze-dried coffee; some instant coffees (check labels). Fruit drinks; fruit and vegetable juice; Rice or Soy milk. Ovaltine, hot chocolate. Some cocoas; some instant coffees; instant iced teas; powdered fruit drinks (read labels).   CONDIMENTS / MISCELLANEOUS Soy sauce, carob powder, olives, gravy made with water, baker's cocoa, pickles, pure seasonings and spices, wine, pure monosodium glutamate, catsup, mustard. Some chewing gums, chocolate, some cocoas. Certain antibiotics and vitamin / mineral preparations. Spice blends if they contain milk products. MSG extender. Artificial sweeteners that contain lactose such as Equal (Nutra-Sweet) and Sweet 'n  Low. Some nondairy creamers (read labels).   SAMPLE MENU*  Breakfast   Orange Juice.  Banana.   Bran flakes.   Nondairy Creamer.  Vienna Bread (toasted).   Butter or milk-free margarine.   Coffee or tea.    Noon Meal   Chicken Breast.  Rice.   Green beans.   Butter or milk-free margarine.  Fresh melon.   Coffee or tea.    Evening Meal   Roast Beef.  Baked potato.   Butter or milk-free margarine.   Broccoli.   Lettuce salad with vinegar and oil dressing.  [employer].   Coffee or tea.       Hemorrhoids Hemorrhoids are dilated (enlarged) veins around the rectum. Sometimes clots will form in the veins. This makes them swollen and painful. These are called thrombosed hemorrhoids. Causes of hemorrhoids include:  Constipation.   Straining to have a bowel movement.   HEAVY LIFTING  HOME CARE INSTRUCTIONS  Eat a well balanced diet and drink 6 to 8 glasses of water every day to avoid constipation. You may also use a bulk laxative.   Avoid straining to have bowel movements.   Keep anal area dry and clean.   Do not use a donut shaped pillow or sit on the toilet for long periods. This increases blood pooling and pain.   Move your bowels when your body has the urge; this will require less straining and will decrease pain and pressure.

## 2016-05-11 NOTE — Anesthesia Preprocedure Evaluation (Signed)
Anesthesia Evaluation  Patient identified by MRN, date of birth, ID band Patient awake    Reviewed: Allergy & Precautions, NPO status , Patient's Chart, lab work & pertinent test results  History of Anesthesia Complications (+) PONV and history of anesthetic complications  Airway Mallampati: II  TM Distance: >3 FB     Dental  (+) Teeth Intact, Dental Advisory Given   Pulmonary former smoker,    breath sounds clear to auscultation       Cardiovascular hypertension, Pt. on medications  Rhythm:Regular Rate:Normal     Neuro/Psych  Headaches, PSYCHIATRIC DISORDERS Anxiety Depression    GI/Hepatic GERD  Medicated and Controlled,  Endo/Other  diabetes, Type 2  Renal/GU      Musculoskeletal  (+) Arthritis , Fibromyalgia -, narcotic dependent  Abdominal   Peds  Hematology   Anesthesia Other Findings Chronic pain   Reproductive/Obstetrics                             Anesthesia Physical Anesthesia Plan  ASA: III  Anesthesia Plan: MAC   Post-op Pain Management:    Induction: Intravenous  Airway Management Planned: Simple Face Mask  Additional Equipment:   Intra-op Plan:   Post-operative Plan:   Informed Consent: I have reviewed the patients History and Physical, chart, labs and discussed the procedure including the risks, benefits and alternatives for the proposed anesthesia with the patient or authorized representative who has indicated his/her understanding and acceptance.     Plan Discussed with:   Anesthesia Plan Comments:         Anesthesia Quick Evaluation

## 2016-05-13 ENCOUNTER — Telehealth: Payer: Self-pay

## 2016-05-13 NOTE — Telephone Encounter (Signed)
Pt called and informed of CT being scheduled for 05/18/16. Instructions given and pt needs to pick-up contrast.

## 2016-05-14 ENCOUNTER — Encounter (HOSPITAL_COMMUNITY): Payer: Self-pay | Admitting: Gastroenterology

## 2016-05-14 MED ORDER — PREDNISONE 50 MG PO TABS: ORAL_TABLET | ORAL | 0 refills | Status: DC

## 2016-05-14 MED ORDER — DIPHENHYDRAMINE HCL 25 MG PO TABS: ORAL_TABLET | ORAL | 0 refills | Status: DC

## 2016-05-14 NOTE — Telephone Encounter (Signed)
Pt is also aware to go by Radiology to pick up contrast before his appt, and NPO after midnight and to arrive 15 min early.

## 2016-05-14 NOTE — Addendum Note (Signed)
Addended by: Gelene MinkBOONE, Felissa Blouch W on: 05/14/2016 12:33 PM   Modules accepted: Orders

## 2016-05-14 NOTE — Telephone Encounter (Signed)
Pt is aware of date and time for the CT and of the medications and how to take.

## 2016-05-14 NOTE — Telephone Encounter (Signed)
CT approved. # is [id number]. Approval good through 06/11/16.    He needs pre-medication due to shellfish allergy. I have sent in Prednisone and Benadryl.  Directions: take Prednisone 13 hours before, 7 hours before, and 1 hour before. Take Benadryl 1 hour before scan along with last dose of Prednisone. Please let patient know.

## 2016-05-18 ENCOUNTER — Other Ambulatory Visit: Payer: Self-pay

## 2016-05-18 ENCOUNTER — Telehealth: Payer: Self-pay

## 2016-05-18 ENCOUNTER — Ambulatory Visit (HOSPITAL_COMMUNITY)
Admission: RE | Admit: 2016-05-18 | Discharge: 2016-05-18 | Disposition: A | Discharge status: Home / Self Care | Payer: BLUE CROSS/BLUE SHIELD | Source: Ambulatory Visit | Attending: Gastroenterology | Admitting: Gastroenterology

## 2016-05-18 DIAGNOSIS — K921 Melena: Secondary | ICD-10-CM | POA: Diagnosis not present

## 2016-05-18 DIAGNOSIS — I700 Atherosclerosis of aorta: Secondary | ICD-10-CM | POA: Diagnosis not present

## 2016-05-18 DIAGNOSIS — R197 Diarrhea, unspecified: Secondary | ICD-10-CM | POA: Insufficient documentation

## 2016-05-18 DIAGNOSIS — R1031 Right lower quadrant pain: Secondary | ICD-10-CM | POA: Diagnosis not present

## 2016-05-18 DIAGNOSIS — N289 Disorder of kidney and ureter, unspecified: Secondary | ICD-10-CM

## 2016-05-18 MED ORDER — IOPAMIDOL (ISOVUE-300) INJECTION 61%
100.0000 mL | Freq: Once | INTRAVENOUS | Status: AC | PRN
  Administered 2016-05-18: 100 mL via INTRAVENOUS

## 2016-05-18 NOTE — Telephone Encounter (Signed)
[facility] on the CT.  Please see Impression 2.Marland Kitchen.Marland Kitchen.Marland Kitchen.Upper pole right renal lesion.  Forwarding to Lewie LoronAnna Boone, NP who saw pt in the office due to Dr. Darrick PennaFields absence.

## 2016-05-18 NOTE — Telephone Encounter (Signed)
An incidental finding of an upper pole right renal lesion noted, most likely a complex cyst. However, we need to proceed with an MRI of right kidney for further evaluation. Please let him know that this MRI is to further evaluation the right renal cyst, and we will know more when it is completed.

## 2016-05-18 NOTE — Telephone Encounter (Signed)
MRI scheduled for 05/26/16 at 9:00am, arrive at 8:45am at [facility]. NPO 4 hours prior. Called and informed pt. Letter mailed also.

## 2016-05-18 NOTE — Telephone Encounter (Signed)
Pt is aware and OK to schedule the MRI.

## 2016-05-19 ENCOUNTER — Encounter (INDEPENDENT_AMBULATORY_CARE_PROVIDER_SITE_OTHER): Payer: Self-pay

## 2016-05-19 NOTE — Telephone Encounter (Signed)
REVIEWED. AGREE. NO ADDITIONAL RECOMMENDATIONS. AWAIT MRI RESULTS.

## 2016-05-26 ENCOUNTER — Ambulatory Visit (HOSPITAL_COMMUNITY)
Admission: RE | Admit: 2016-05-26 | Discharge: 2016-05-26 | Disposition: A | Discharge status: Home / Self Care | Payer: BLUE CROSS/BLUE SHIELD | Source: Ambulatory Visit | Attending: Gastroenterology | Admitting: Gastroenterology

## 2016-05-26 ENCOUNTER — Telehealth: Payer: Self-pay | Admitting: Gastroenterology

## 2016-05-26 ENCOUNTER — Other Ambulatory Visit: Payer: Self-pay

## 2016-05-26 DIAGNOSIS — N289 Disorder of kidney and ureter, unspecified: Secondary | ICD-10-CM

## 2016-05-26 DIAGNOSIS — R350 Frequency of micturition: Secondary | ICD-10-CM

## 2016-05-26 DIAGNOSIS — R935 Abnormal findings on diagnostic imaging of other abdominal regions, including retroperitoneum: Secondary | ICD-10-CM | POA: Diagnosis not present

## 2016-05-26 DIAGNOSIS — I700 Atherosclerosis of aorta: Secondary | ICD-10-CM | POA: Insufficient documentation

## 2016-05-26 MED ORDER — GADOBENATE DIMEGLUMINE 529 MG/ML IV SOLN
20.0000 mL | Freq: Once | INTRAVENOUS | Status: AC | PRN
  Administered 2016-05-26: 20 mL via INTRAVENOUS

## 2016-05-26 NOTE — Telephone Encounter (Signed)
LMOM for a return call.  

## 2016-05-26 NOTE — Telephone Encounter (Signed)
Please call pt. HIS colon biopsies are normal.  DRINK WATER TO KEEP YOUR URINE LIGHT YELLOW.  FOLLOW A HIGH FIBER/LACTOSE FREE DIET. AVOID ITEMS THAT CAUSE BLOATING.   START DICYCLOMINE 30 MINUTES PRIOR TO BREAKFAST AND AT BEDTIME TO REDUCE DIARRHEA AND ABDOMINAL PAIN. IT MAY CAUSE DROWSINESS, DRY EYES/MOUTH, BLURRY VISION, OR DIFFICULTY URINATING.  COMPLETE MRI.  USE PREPARATION H FOUR TIMES  A DAY IF NEEDED TO RELIEVE RECTAL PAIN/PRESSURE/BLEEDING.  PLEASE CALL WITH QUESTIONS OR CONCERNS.  SEE UROLOGY TO EVALUATE YOUR PROSTATE SINCE YOU ARE UP SEVERAL TIMES A NIGHT TO URINATE.  FOLLOW UP IN 4 MOS E30 DIARRHEA.

## 2016-05-26 NOTE — Telephone Encounter (Signed)
Referral has been made.

## 2016-05-26 NOTE — Telephone Encounter (Signed)
Pt is aware. Ok to refer to urology.

## 2016-05-26 NOTE — Telephone Encounter (Signed)
OV made °

## 2016-06-01 NOTE — Progress Notes (Signed)
Good news! The MRI shows that the area of concern in the right kidney is a cyst.

## 2016-06-02 NOTE — Progress Notes (Signed)
LMOM to call.

## 2016-06-03 NOTE — Progress Notes (Signed)
Pt is aware.  

## 2016-06-08 NOTE — Telephone Encounter (Signed)
Called [facility]. Appt made for 07/20/16 at 2:00pm with Dr. Retta Dionesahlstedt ([place] office). Called and informed pt. Referral info faxed.

## 2016-06-16 DIAGNOSIS — H52203 Unspecified astigmatism, bilateral: Secondary | ICD-10-CM | POA: Diagnosis not present

## 2016-06-16 DIAGNOSIS — H40002 Preglaucoma, unspecified, left eye: Secondary | ICD-10-CM | POA: Diagnosis not present

## 2016-06-16 DIAGNOSIS — H5202 Hypermetropia, left eye: Secondary | ICD-10-CM | POA: Diagnosis not present

## 2016-06-16 DIAGNOSIS — H524 Presbyopia: Secondary | ICD-10-CM | POA: Diagnosis not present

## 2016-06-16 DIAGNOSIS — E119 Type 2 diabetes mellitus without complications: Secondary | ICD-10-CM | POA: Diagnosis not present

## 2016-06-16 LAB — HM DIABETES EYE EXAM

## 2016-06-21 DIAGNOSIS — M48062 Spinal stenosis, lumbar region with neurogenic claudication: Secondary | ICD-10-CM | POA: Diagnosis not present

## 2016-06-22 ENCOUNTER — Other Ambulatory Visit: Payer: Self-pay | Admitting: Neurosurgery

## 2016-06-24 ENCOUNTER — Encounter: Payer: Self-pay | Admitting: Pediatrics

## 2016-06-25 ENCOUNTER — Encounter (HOSPITAL_COMMUNITY)
Admission: RE | Admit: 2016-06-25 | Discharge: 2016-06-25 | Disposition: A | Discharge status: Home / Self Care | Payer: BLUE CROSS/BLUE SHIELD | Source: Ambulatory Visit | Attending: Neurosurgery | Admitting: Neurosurgery

## 2016-06-25 ENCOUNTER — Encounter (HOSPITAL_COMMUNITY): Payer: Self-pay

## 2016-06-25 DIAGNOSIS — Z01812 Encounter for preprocedural laboratory examination: Secondary | ICD-10-CM | POA: Diagnosis not present

## 2016-06-25 DIAGNOSIS — M5416 Radiculopathy, lumbar region: Secondary | ICD-10-CM | POA: Diagnosis not present

## 2016-06-25 HISTORY — DX: Cardiac murmur, unspecified: R01.1

## 2016-06-25 LAB — BASIC METABOLIC PANEL
Anion gap: 4 — ABNORMAL LOW (ref 5–15)
BUN: 10 mg/dL (ref 6–20)
CHLORIDE: 104 mmol/L (ref 101–111)
CO2: 30 mmol/L (ref 22–32)
Calcium: 9.2 mg/dL (ref 8.9–10.3)
Creatinine, Ser: 0.8 mg/dL (ref 0.61–1.24)
GFR calc non Af Amer: >60 mL/min (ref >60)
Glucose, Bld: 101 mg/dL — ABNORMAL HIGH (ref 65–99)
POTASSIUM: 4.3 mmol/L (ref 3.5–5.1)
SODIUM: 138 mmol/L (ref 135–145)

## 2016-06-25 LAB — SURGICAL PCR SCREEN
MRSA, PCR: NEGATIVE
Staphylococcus aureus: NEGATIVE

## 2016-06-25 LAB — CBC
HEMATOCRIT: 46.7 % (ref 39.0–52.0)
Hemoglobin: 16.3 g/dL (ref 13.0–17.0)
MCH: 30.9 pg (ref 26.0–34.0)
MCHC: 34.9 g/dL (ref 30.0–36.0)
MCV: 88.6 fL (ref 78.0–100.0)
Platelets: 192 10*3/uL (ref 150–400)
RBC: 5.27 MIL/uL (ref 4.22–5.81)
RDW: 12.7 % (ref 11.5–15.5)
WBC: 8 10*3/uL (ref 4.0–10.5)

## 2016-06-25 LAB — TYPE AND SCREEN
ABO/RH(D): B POS
ANTIBODY SCREEN: NEGATIVE

## 2016-06-25 LAB — ABO/RH: ABO/RH(D): B POS

## 2016-06-25 LAB — GLUCOSE, CAPILLARY: Glucose-Capillary: 103 mg/dL — ABNORMAL HIGH (ref 65–99)

## 2016-06-25 NOTE — Progress Notes (Signed)
PCP is Dr. Gerda DissLuking  States he saw a cardiologist in 2014 not sure of the Dr 's name. States he doesn't check his blood sugars. Order placed for [facility]!C States he was told he had a heart murmer as a child.

## 2016-06-25 NOTE — Pre-Procedure Instructions (Signed)
Vernon Fisher  06/25/2016      [facility] - [place], [place] - [address] [place] [id number] Phone: [phone] Fax: [phone]  [facility] - [place],  - [id number] [place] [id number] Phone: [phone] Fax: [phone]   Your procedure is scheduled on Dec 29  [facility] at 1000 A.M.  Call this number if you have problems the morning of surgery:  [phone]   Remember:  Do not eat food or drink liquids after midnight.  Take these medicines the morning of surgery with A SIP OF WATER Dicyclomine (Bentyl), escitalopram (Lexapro), Pantoprazole (Protonix)  Stop taking aspirin, BC's, Goody's, Herbal medications, Fish Oil, Ibuprofen, Advil, Motrin, Aleve, Anaprox   Do not wear jewelry, make-up or nail polish.  Do not wear lotions, powders, or perfumes, or deoderant.  Do not shave 48 hours prior to surgery.  Men may shave face and neck.  Do not bring valuables to the hospital.  [facility] is not responsible for any belongings or valuables.  Contacts, dentures or bridgework may not be worn into surgery.  Leave your suitcase in the car.  After surgery it may be brought to your room.  For patients admitted to the hospital, discharge time will be determined by your treatment team.  Patients discharged the day of surgery will not be allowed to drive home.   Special instructions:  [place] - Preparing for Surgery  Before surgery, you can play an important role.  Because skin is not sterile, your skin needs to be as free of germs as possible.  You can reduce the number of germs on you skin by washing with CHG (chlorahexidine gluconate) soap before surgery.  CHG is an antiseptic cleaner which kills germs and bonds with the skin to continue killing germs even after washing.  Please DO NOT use if you have an allergy to CHG or antibacterial soaps.  If  your skin becomes reddened/irritated stop using the CHG and inform your nurse when you arrive at Short Stay.  Do not shave (including legs and underarms) for at least 48 hours prior to the first CHG shower.  You may shave your face.  Please follow these instructions carefully:   1.  Shower with CHG Soap the night before surgery and the   morning of Surgery.  2.  If you choose to wash your hair, wash your hair first as usual with your       normal shampoo.  3.  After you shampoo, rinse your hair and body thoroughly to remove the                      Shampoo.  4.  Use CHG as you would any other liquid soap.  You can apply chg directly       to the skin and wash gently with scrungie or a clean washcloth.  5.  Apply the CHG Soap to your body ONLY FROM THE NECK DOWN.        Do not use on open wounds or open sores.  Avoid contact with your eyes,       ears, mouth and genitals (private parts).  Wash genitals (private parts)       with your normal soap.  6.  Wash thoroughly, paying special attention to the area where your surgery        will be  performed.  7.  Thoroughly rinse your body with warm water from the neck down.  8.  DO NOT shower/wash with your normal soap after using and rinsing off       the CHG Soap.  9.  Pat yourself dry with a clean towel.            10.  Wear clean pajamas.            11.  Place clean sheets on your bed the night of your first shower and do not        sleep with pets.  Day of Surgery  Do not apply any lotions/deoderants the morning of surgery.  Please wear clean clothes to the hospital/surgery center.     Please read over the following fact sheets that you were given. Pain Booklet, MRSA Information and Surgical Site Infection Prevention

## 2016-06-25 NOTE — Progress Notes (Signed)
   06/25/16 1133  OBSTRUCTIVE SLEEP APNEA  Have you ever been diagnosed with sleep apnea through a sleep study? No  Do you snore loudly (loud enough to be heard through closed doors)?  1  Do you often feel tired, fatigued, or sleepy during the daytime (such as falling asleep during driving or talking to someone)? 0  Has anyone observed you stop breathing during your sleep? 0  Do you have, or are you being treated for high blood pressure? 1  BMI more than 35 kg/m2? 0  Age > 50 (1-yes) 1  Neck circumference greater than:Male 16 inches or larger, Male 17inches or larger? 1  Male Gender (Yes=1) 1  Obstructive Sleep Apnea Score 5  Score 5 or greater  Results sent to PCP

## 2016-06-26 LAB — HEMOGLOBIN A1C
Hgb A1c MFr Bld: 5.4 % (ref 4.8–5.6)
MEAN PLASMA GLUCOSE: 108 mg/dL

## 2016-06-29 NOTE — Progress Notes (Signed)
Anesthesia chart review: Patient is a 60 year old male scheduled for L4-5 PLIF on 07/02/2016 by Dr. Mikal Planeabell.  History includes former smoker, postoperative nausea and vomiting, GERD, hypertension, migraines, degenerative disc disease, fibromyalgia, depression, anxiety, diet controlled diabetes mellitus type 2, childhood murmur, reflux, nasal surgery, cervical fusion.  PCP is Dr. Gerda DissLuking. He is not followed routinely by cardiologist, but he did see Dr. Dietrich PatesPaula Ross in January 2014 following ED visit for chest pain. Stress test was considered low risk and PRN cardiology follow-up recommended following 07/17/12 visit.  Meds include Bentyl, enalapril, Lexapro, Protonix, Ambien.  BP 132/77   Pulse (!) 59   Temp 36.5 C   Resp 20   Ht 5\' 11"  (1.803 m)   Wt 221 lb 6.4 oz (100.4 kg)   SpO2 99%   BMI 30.88 kg/m   12/16/2015 EKG: Normal sinus rhythm, incomplete right bundle branch block.  07/14/2012 Nuclear stress test:IMPRESSION: Probably negative pharmacologic and exercise stress nuclear myocardial study revealing good exercise tolerance, a negative stress EKG, normal left ventricular size and normal left ventricular systolic function.  By scintigraphic imaging, there were mild defects as described, probably related to soft tissue attenuation but no convincing evidence for ischemia or infarction. Other findings as noted.  08/07/2013 Spirometry:  FVC 4.96 (97%), FEV1 4.16 (107%).  Preoperative labs noted. A1c 5.4%.  If no acute changes then I anticipate that he can proceed as planned.  Velna Ochsllison Izabellah Dadisman, PA-C [facility]/Anesthesiology Phone [phone] 06/29/2016 4:37 PM

## 2016-07-01 MED ORDER — VANCOMYCIN HCL 10 G IV SOLR
1500.0000 mg | INTRAVENOUS | Status: AC
  Administered 2016-07-02: 1500 mg via INTRAVENOUS
  Filled 2016-07-01: qty 1500

## 2016-07-02 ENCOUNTER — Inpatient Hospital Stay (HOSPITAL_COMMUNITY): Payer: BLUE CROSS/BLUE SHIELD | Admitting: Vascular Surgery

## 2016-07-02 ENCOUNTER — Inpatient Hospital Stay (HOSPITAL_COMMUNITY): Payer: BLUE CROSS/BLUE SHIELD | Admitting: Anesthesiology

## 2016-07-02 ENCOUNTER — Encounter (HOSPITAL_COMMUNITY)
Admission: RE | Disposition: A | Discharge status: Home / Self Care | Payer: Self-pay | Source: Ambulatory Visit | Attending: Neurosurgery

## 2016-07-02 ENCOUNTER — Encounter (HOSPITAL_COMMUNITY): Payer: Self-pay | Admitting: *Deleted

## 2016-07-02 ENCOUNTER — Inpatient Hospital Stay (HOSPITAL_COMMUNITY): Payer: BLUE CROSS/BLUE SHIELD

## 2016-07-02 ENCOUNTER — Inpatient Hospital Stay (HOSPITAL_COMMUNITY)
Admission: RE | Admit: 2016-07-02 | Discharge: 2016-07-04 | DRG: 460 | Disposition: A | Discharge status: Home / Self Care | Payer: BLUE CROSS/BLUE SHIELD | Source: Ambulatory Visit | Attending: Neurosurgery | Admitting: Neurosurgery

## 2016-07-02 DIAGNOSIS — Z981 Arthrodesis status: Secondary | ICD-10-CM | POA: Diagnosis not present

## 2016-07-02 DIAGNOSIS — G894 Chronic pain syndrome: Secondary | ICD-10-CM | POA: Diagnosis not present

## 2016-07-02 DIAGNOSIS — M4726 Other spondylosis with radiculopathy, lumbar region: Principal | ICD-10-CM | POA: Diagnosis present

## 2016-07-02 DIAGNOSIS — Z880 Allergy status to penicillin: Secondary | ICD-10-CM

## 2016-07-02 DIAGNOSIS — R2689 Other abnormalities of gait and mobility: Secondary | ICD-10-CM

## 2016-07-02 DIAGNOSIS — Z79891 Long term (current) use of opiate analgesic: Secondary | ICD-10-CM

## 2016-07-02 DIAGNOSIS — Z79899 Other long term (current) drug therapy: Secondary | ICD-10-CM | POA: Diagnosis not present

## 2016-07-02 DIAGNOSIS — Z91040 Latex allergy status: Secondary | ICD-10-CM | POA: Diagnosis not present

## 2016-07-02 DIAGNOSIS — I10 Essential (primary) hypertension: Secondary | ICD-10-CM | POA: Diagnosis not present

## 2016-07-02 DIAGNOSIS — Z419 Encounter for procedure for purposes other than remedying health state, unspecified: Secondary | ICD-10-CM

## 2016-07-02 DIAGNOSIS — E119 Type 2 diabetes mellitus without complications: Secondary | ICD-10-CM | POA: Diagnosis not present

## 2016-07-02 DIAGNOSIS — M5416 Radiculopathy, lumbar region: Secondary | ICD-10-CM | POA: Diagnosis not present

## 2016-07-02 LAB — CBC
HEMATOCRIT: 40.4 % (ref 39.0–52.0)
HEMOGLOBIN: 13.9 g/dL (ref 13.0–17.0)
MCH: 30.7 pg (ref 26.0–34.0)
MCHC: 34.4 g/dL (ref 30.0–36.0)
MCV: 89.2 fL (ref 78.0–100.0)
Platelets: 172 10*3/uL (ref 150–400)
RBC: 4.53 MIL/uL (ref 4.22–5.81)
RDW: 12.6 % (ref 11.5–15.5)
WBC: 11.9 10*3/uL — ABNORMAL HIGH (ref 4.0–10.5)

## 2016-07-02 LAB — CREATININE, SERUM
Creatinine, Ser: 0.72 mg/dL (ref 0.61–1.24)
GFR calc Af Amer: >60 mL/min (ref >60)
GFR calc non Af Amer: >60 mL/min (ref >60)

## 2016-07-02 LAB — GLUCOSE, CAPILLARY
GLUCOSE-CAPILLARY: 106 mg/dL — AB (ref 65–99)
GLUCOSE-CAPILLARY: 108 mg/dL — AB (ref 65–99)

## 2016-07-02 SURGERY — POSTERIOR LUMBAR FUSION 1 LEVEL
Anesthesia: General | Site: Back

## 2016-07-02 MED ORDER — ROCURONIUM BROMIDE 100 MG/10ML IV SOLN
INTRAVENOUS | Status: DC | PRN
  Administered 2016-07-02: 30 mg via INTRAVENOUS
  Administered 2016-07-02: 50 mg via INTRAVENOUS
  Administered 2016-07-02 (×2): 10 mg via INTRAVENOUS

## 2016-07-02 MED ORDER — SODIUM CHLORIDE 0.9% FLUSH
3.0000 mL | Freq: Two times a day (BID) | INTRAVENOUS | Status: DC
  Administered 2016-07-02 – 2016-07-04 (×3): 3 mL via INTRAVENOUS

## 2016-07-02 MED ORDER — SODIUM CHLORIDE 0.9% FLUSH: 3.0000 mL | INTRAVENOUS | Status: DC | PRN

## 2016-07-02 MED ORDER — DOCUSATE SODIUM 100 MG PO CAPS
100.0000 mg | ORAL_CAPSULE | Freq: Two times a day (BID) | ORAL | Status: DC
  Administered 2016-07-02 – 2016-07-04 (×4): 100 mg via ORAL
  Filled 2016-07-02 (×4): qty 1

## 2016-07-02 MED ORDER — POTASSIUM CHLORIDE IN NACL 20-0.9 MEQ/L-% IV SOLN
INTRAVENOUS | Status: DC
  Administered 2016-07-02: 20:00:00 via INTRAVENOUS
  Filled 2016-07-02 (×2): qty 1000

## 2016-07-02 MED ORDER — BISACODYL 5 MG PO TBEC: 5.0000 mg | DELAYED_RELEASE_TABLET | Freq: Every day | ORAL | Status: DC | PRN

## 2016-07-02 MED ORDER — BUPIVACAINE HCL (PF) 0.5 % IJ SOLN
INTRAMUSCULAR | Status: AC
  Filled 2016-07-02: qty 30

## 2016-07-02 MED ORDER — OXYCODONE-ACETAMINOPHEN 5-325 MG PO TABS
1.0000 | ORAL_TABLET | ORAL | Status: DC | PRN
  Administered 2016-07-03 – 2016-07-04 (×8): 2 via ORAL
  Filled 2016-07-02 (×9): qty 2

## 2016-07-02 MED ORDER — ONDANSETRON HCL 4 MG/2ML IJ SOLN
INTRAMUSCULAR | Status: AC
  Filled 2016-07-02: qty 2

## 2016-07-02 MED ORDER — HYDROMORPHONE HCL 1 MG/ML IJ SOLN
0.2500 mg | INTRAMUSCULAR | Status: DC | PRN
  Administered 2016-07-02 (×4): 0.5 mg via INTRAVENOUS

## 2016-07-02 MED ORDER — PHENOL 1.4 % MT LIQD: 1.0000 | OROMUCOSAL | Status: DC | PRN

## 2016-07-02 MED ORDER — LIDOCAINE HCL (CARDIAC) 20 MG/ML IV SOLN
INTRAVENOUS | Status: DC | PRN
  Administered 2016-07-02: 100 mg via INTRAVENOUS

## 2016-07-02 MED ORDER — PROPOFOL 10 MG/ML IV BOLUS
INTRAVENOUS | Status: DC | PRN
  Administered 2016-07-02: 150 mg via INTRAVENOUS

## 2016-07-02 MED ORDER — MENTHOL 3 MG MT LOZG: 1.0000 | LOZENGE | OROMUCOSAL | Status: DC | PRN

## 2016-07-02 MED ORDER — HYDROCODONE-ACETAMINOPHEN 5-325 MG PO TABS: 1.0000 | ORAL_TABLET | ORAL | Status: DC | PRN

## 2016-07-02 MED ORDER — SUGAMMADEX SODIUM 500 MG/5ML IV SOLN
INTRAVENOUS | Status: DC | PRN
  Administered 2016-07-02: 200.8 mg via INTRAVENOUS

## 2016-07-02 MED ORDER — HEPARIN SODIUM (PORCINE) 5000 UNIT/ML IJ SOLN
5000.0000 [IU] | Freq: Three times a day (TID) | INTRAMUSCULAR | Status: DC
  Administered 2016-07-04: 5000 [IU] via SUBCUTANEOUS
  Filled 2016-07-02: qty 1

## 2016-07-02 MED ORDER — OXYCODONE HCL 5 MG PO TABS
5.0000 mg | ORAL_TABLET | Freq: Once | ORAL | Status: AC | PRN
  Administered 2016-07-02: 5 mg via ORAL

## 2016-07-02 MED ORDER — DICYCLOMINE HCL 10 MG PO CAPS
10.0000 mg | ORAL_CAPSULE | Freq: Three times a day (TID) | ORAL | Status: DC
  Filled 2016-07-02 (×9): qty 1

## 2016-07-02 MED ORDER — SUGAMMADEX SODIUM 200 MG/2ML IV SOLN
INTRAVENOUS | Status: AC
  Filled 2016-07-02: qty 2

## 2016-07-02 MED ORDER — ACETAMINOPHEN 325 MG PO TABS
650.0000 mg | ORAL_TABLET | ORAL | Status: DC | PRN
  Administered 2016-07-02 – 2016-07-03 (×2): 650 mg via ORAL
  Filled 2016-07-02 (×2): qty 2

## 2016-07-02 MED ORDER — ONDANSETRON HCL 4 MG/2ML IJ SOLN
INTRAMUSCULAR | Status: DC | PRN
  Administered 2016-07-02: 4 mg via INTRAVENOUS

## 2016-07-02 MED ORDER — DIAZEPAM 5 MG PO TABS
ORAL_TABLET | ORAL | Status: AC
  Filled 2016-07-02: qty 1

## 2016-07-02 MED ORDER — CHLORHEXIDINE GLUCONATE CLOTH 2 % EX PADS: 6.0000 | MEDICATED_PAD | Freq: Once | CUTANEOUS | Status: DC

## 2016-07-02 MED ORDER — LIDOCAINE-EPINEPHRINE (PF) 2 %-1:200000 IJ SOLN
INTRAMUSCULAR | Status: AC
  Filled 2016-07-02: qty 20

## 2016-07-02 MED ORDER — ADULT MULTIVITAMIN W/MINERALS CH
1.0000 | ORAL_TABLET | Freq: Every day | ORAL | Status: DC
  Administered 2016-07-03 – 2016-07-04 (×2): 1 via ORAL
  Filled 2016-07-02 (×3): qty 1

## 2016-07-02 MED ORDER — SENNOSIDES-DOCUSATE SODIUM 8.6-50 MG PO TABS: 1.0000 | ORAL_TABLET | Freq: Every evening | ORAL | Status: DC | PRN

## 2016-07-02 MED ORDER — DIAZEPAM 5 MG PO TABS
5.0000 mg | ORAL_TABLET | Freq: Four times a day (QID) | ORAL | Status: DC | PRN
  Administered 2016-07-02 (×2): 5 mg via ORAL
  Filled 2016-07-02: qty 1

## 2016-07-02 MED ORDER — LIDOCAINE 2% (20 MG/ML) 5 ML SYRINGE
INTRAMUSCULAR | Status: AC
  Filled 2016-07-02: qty 5

## 2016-07-02 MED ORDER — FENTANYL CITRATE (PF) 100 MCG/2ML IJ SOLN
INTRAMUSCULAR | Status: DC | PRN
  Administered 2016-07-02: 50 ug via INTRAVENOUS
  Administered 2016-07-02: 150 ug via INTRAVENOUS

## 2016-07-02 MED ORDER — LACTATED RINGERS IV SOLN
INTRAVENOUS | Status: DC
  Administered 2016-07-02: 11:00:00 via INTRAVENOUS

## 2016-07-02 MED ORDER — THROMBIN 20000 UNITS EX SOLR
CUTANEOUS | Status: DC | PRN
  Administered 2016-07-02: 12:00:00 via TOPICAL

## 2016-07-02 MED ORDER — SODIUM CHLORIDE 0.9 % IV SOLN: 250.0000 mL | INTRAVENOUS | Status: DC

## 2016-07-02 MED ORDER — PHENYLEPHRINE HCL 10 MG/ML IJ SOLN
INTRAVENOUS | Status: DC | PRN
  Administered 2016-07-02: 20 ug/min via INTRAVENOUS

## 2016-07-02 MED ORDER — ONDANSETRON HCL 4 MG/2ML IJ SOLN
4.0000 mg | INTRAMUSCULAR | Status: DC | PRN
  Administered 2016-07-03: 4 mg via INTRAVENOUS
  Filled 2016-07-02: qty 2

## 2016-07-02 MED ORDER — THROMBIN 20000 UNITS EX SOLR
CUTANEOUS | Status: AC
  Filled 2016-07-02: qty 20000

## 2016-07-02 MED ORDER — ESCITALOPRAM OXALATE 10 MG PO TABS
10.0000 mg | ORAL_TABLET | Freq: Every day | ORAL | Status: DC
  Administered 2016-07-02 – 2016-07-04 (×3): 10 mg via ORAL
  Filled 2016-07-02 (×4): qty 1

## 2016-07-02 MED ORDER — LIDOCAINE-EPINEPHRINE (PF) 1 %-1:200000 IJ SOLN
INTRAMUSCULAR | Status: DC | PRN
  Administered 2016-07-02: 20 mL

## 2016-07-02 MED ORDER — MORPHINE SULFATE (PF) 2 MG/ML IV SOLN
1.0000 mg | INTRAVENOUS | Status: DC | PRN
  Administered 2016-07-02: 4 mg via INTRAVENOUS
  Administered 2016-07-03 – 2016-07-04 (×3): 2 mg via INTRAVENOUS
  Filled 2016-07-02: qty 1
  Filled 2016-07-02: qty 2
  Filled 2016-07-02 (×2): qty 1

## 2016-07-02 MED ORDER — EPHEDRINE 5 MG/ML INJ
INTRAVENOUS | Status: AC
  Filled 2016-07-02: qty 10

## 2016-07-02 MED ORDER — ENALAPRIL MALEATE 5 MG PO TABS
10.0000 mg | ORAL_TABLET | Freq: Every day | ORAL | Status: DC
  Administered 2016-07-02: 5 mg via ORAL
  Administered 2016-07-03 – 2016-07-04 (×2): 10 mg via ORAL
  Filled 2016-07-02 (×3): qty 2

## 2016-07-02 MED ORDER — PANTOPRAZOLE SODIUM 40 MG PO TBEC
40.0000 mg | DELAYED_RELEASE_TABLET | Freq: Every day | ORAL | Status: DC
  Administered 2016-07-02 – 2016-07-04 (×3): 40 mg via ORAL
  Filled 2016-07-02 (×3): qty 1

## 2016-07-02 MED ORDER — BUPIVACAINE HCL 0.5 % IJ SOLN
INTRAMUSCULAR | Status: DC | PRN
  Administered 2016-07-02: 30 mL

## 2016-07-02 MED ORDER — MAGNESIUM CITRATE PO SOLN: 1.0000 | Freq: Once | ORAL | Status: DC | PRN

## 2016-07-02 MED ORDER — ZOLPIDEM TARTRATE 5 MG PO TABS
10.0000 mg | ORAL_TABLET | Freq: Every evening | ORAL | Status: DC | PRN
  Administered 2016-07-02 – 2016-07-03 (×2): 10 mg via ORAL
  Filled 2016-07-02 (×2): qty 2

## 2016-07-02 MED ORDER — ONDANSETRON HCL 4 MG/2ML IJ SOLN: 4.0000 mg | Freq: Four times a day (QID) | INTRAMUSCULAR | Status: DC | PRN

## 2016-07-02 MED ORDER — ACETAMINOPHEN 650 MG RE SUPP: 650.0000 mg | RECTAL | Status: DC | PRN

## 2016-07-02 MED ORDER — MIDAZOLAM HCL 2 MG/2ML IJ SOLN
INTRAMUSCULAR | Status: AC
  Filled 2016-07-02: qty 2

## 2016-07-02 MED ORDER — FENTANYL CITRATE (PF) 100 MCG/2ML IJ SOLN
INTRAMUSCULAR | Status: AC
  Filled 2016-07-02: qty 4

## 2016-07-02 MED ORDER — ROCURONIUM BROMIDE 50 MG/5ML IV SOSY
PREFILLED_SYRINGE | INTRAVENOUS | Status: AC
  Filled 2016-07-02: qty 15

## 2016-07-02 MED ORDER — HYDROMORPHONE HCL 1 MG/ML IJ SOLN
INTRAMUSCULAR | Status: AC
  Filled 2016-07-02: qty 2

## 2016-07-02 MED ORDER — OXYCODONE HCL 5 MG PO TABS
ORAL_TABLET | ORAL | Status: AC
  Filled 2016-07-02: qty 2

## 2016-07-02 MED ORDER — 0.9 % SODIUM CHLORIDE (POUR BTL) OPTIME
TOPICAL | Status: DC | PRN
  Administered 2016-07-02: 1000 mL

## 2016-07-02 MED ORDER — LACTATED RINGERS IV SOLN
INTRAVENOUS | Status: DC | PRN
  Administered 2016-07-02 (×2): via INTRAVENOUS

## 2016-07-02 MED ORDER — OXYCODONE HCL 5 MG/5ML PO SOLN: 5.0000 mg | Freq: Once | ORAL | Status: AC | PRN

## 2016-07-02 SURGICAL SUPPLY — 78 items
ADH SKN CLS APL DERMABOND .7 (GAUZE/BANDAGES/DRESSINGS) ×1
APL SKNCLS STERI-STRIP NONHPOA (GAUZE/BANDAGES/DRESSINGS)
BASKET BONE COLLECTION (BASKET) ×1 IMPLANT
BENZOIN TINCTURE PRP APPL 2/3 (GAUZE/BANDAGES/DRESSINGS) IMPLANT
BIT DRILL PLIF MAS DISP 5.5MM (DRILL) IMPLANT
BLADE CLIPPER SURG (BLADE) IMPLANT
BONE VIVIGEN FORMABLE 10CC (Bone Implant) ×3 IMPLANT
BUR MATCHSTICK NEURO 3.0 LAGG (BURR) ×3 IMPLANT
BUR PRECISION FLUTE 5.0 (BURR) ×3 IMPLANT
CANISTER SUCT 3000ML PPV (MISCELLANEOUS) ×3 IMPLANT
CAP RELINE MOD TULIP RMM (Cap) ×8 IMPLANT
CARTRIDGE OIL MAESTRO DRILL (MISCELLANEOUS) ×1 IMPLANT
CLOSURE WOUND 1/2 X4 (GAUZE/BANDAGES/DRESSINGS)
CONT SPEC 4OZ CLIKSEAL STRL BL (MISCELLANEOUS) ×3 IMPLANT
COVER BACK TABLE 60X90IN (DRAPES) ×3 IMPLANT
DECANTER SPIKE VIAL GLASS SM (MISCELLANEOUS) ×3 IMPLANT
DERMABOND ADVANCED (GAUZE/BANDAGES/DRESSINGS) ×2
DERMABOND ADVANCED .7 DNX12 (GAUZE/BANDAGES/DRESSINGS) ×1 IMPLANT
DIFFUSER DRILL AIR PNEUMATIC (MISCELLANEOUS) ×3 IMPLANT
DRAPE C-ARM 42X72 X-RAY (DRAPES) ×6 IMPLANT
DRAPE C-ARMOR (DRAPES) ×2 IMPLANT
DRAPE LAPAROTOMY 100X72X124 (DRAPES) ×3 IMPLANT
DRAPE POUCH INSTRU U-SHP 10X18 (DRAPES) ×3 IMPLANT
DRAPE SURG 17X23 STRL (DRAPES) ×3 IMPLANT
DRILL PLIF MAS DISP 5.5MM (DRILL) ×3
DRSG OPSITE POSTOP 4X8 (GAUZE/BANDAGES/DRESSINGS) ×2 IMPLANT
DURAPREP 26ML APPLICATOR (WOUND CARE) ×3 IMPLANT
ELECT BLADE 4.0 EZ CLEAN MEGAD (MISCELLANEOUS) ×3
ELECT REM PT RETURN 9FT ADLT (ELECTROSURGICAL) ×3
ELECTRODE BLDE 4.0 EZ CLN MEGD (MISCELLANEOUS) IMPLANT
ELECTRODE REM PT RTRN 9FT ADLT (ELECTROSURGICAL) ×1 IMPLANT
GAUZE SPONGE 4X4 12PLY STRL (GAUZE/BANDAGES/DRESSINGS) IMPLANT
GAUZE SPONGE 4X4 16PLY XRAY LF (GAUZE/BANDAGES/DRESSINGS) IMPLANT
GLOVE BIO SURGEON STRL SZ 6.5 (GLOVE) ×2 IMPLANT
GLOVE BIO SURGEONS STRL SZ 6.5 (GLOVE) ×2
GLOVE BIOGEL PI IND STRL 6.5 (GLOVE) IMPLANT
GLOVE BIOGEL PI IND STRL 8.5 (GLOVE) IMPLANT
GLOVE BIOGEL PI INDICATOR 6.5 (GLOVE) ×4
GLOVE BIOGEL PI INDICATOR 8.5 (GLOVE) ×2
GLOVE ECLIPSE 6.5 STRL STRAW (GLOVE) ×6 IMPLANT
GLOVE ECLIPSE 8.5 STRL (GLOVE) ×2 IMPLANT
GLOVE EXAM NITRILE LRG STRL (GLOVE) IMPLANT
GLOVE EXAM NITRILE XL STR (GLOVE) IMPLANT
GLOVE EXAM NITRILE XS STR PU (GLOVE) IMPLANT
GOWN STRL REUS W/ TWL LRG LVL3 (GOWN DISPOSABLE) ×2 IMPLANT
GOWN STRL REUS W/ TWL XL LVL3 (GOWN DISPOSABLE) IMPLANT
GOWN STRL REUS W/TWL 2XL LVL3 (GOWN DISPOSABLE) ×2 IMPLANT
GOWN STRL REUS W/TWL LRG LVL3 (GOWN DISPOSABLE) ×9
GOWN STRL REUS W/TWL XL LVL3 (GOWN DISPOSABLE) ×3
GRAFT BNE MATRIX VG FRMBL L 10 (Bone Implant) IMPLANT
KIT BASIN OR (CUSTOM PROCEDURE TRAY) ×3 IMPLANT
KIT POSITION SURG JACKSON T1 (MISCELLANEOUS) ×3 IMPLANT
KIT ROOM TURNOVER OR (KITS) ×3 IMPLANT
NDL HYPO 25X1 1.5 SAFETY (NEEDLE) ×1 IMPLANT
NDL SPNL 18GX3.5 QUINCKE PK (NEEDLE) IMPLANT
NEEDLE HYPO 25X1 1.5 SAFETY (NEEDLE) ×3 IMPLANT
NEEDLE SPNL 18GX3.5 QUINCKE PK (NEEDLE) IMPLANT
NS IRRIG 1000ML POUR BTL (IV SOLUTION) ×3 IMPLANT
OIL CARTRIDGE MAESTRO DRILL (MISCELLANEOUS) ×3
PACK LAMINECTOMY NEURO (CUSTOM PROCEDURE TRAY) ×3 IMPLANT
PAD ARMBOARD 7.5X6 YLW CONV (MISCELLANEOUS) ×6 IMPLANT
ROD RELINE 5.0X45MM (Rod) ×2 IMPLANT
ROD RELINE COCR LORD 5X40MM (Rod) ×1 IMPLANT
SCREW LOCK RSS 4.5/5.0MM (Screw) ×8 IMPLANT
SCREW SHANK RELINE 6.5X40MM (Screw) ×1 IMPLANT
SCREW SHANK RELINE MOD 6.5X35 (Screw) ×6 IMPLANT
SPONGE LAP 4X18 X RAY DECT (DISPOSABLE) IMPLANT
SPONGE SURGIFOAM ABS GEL 100 (HEMOSTASIS) ×3 IMPLANT
STRIP CLOSURE SKIN 1/2X4 (GAUZE/BANDAGES/DRESSINGS) IMPLANT
SUT PROLENE 6 0 BV (SUTURE) IMPLANT
SUT VIC AB 0 CT1 18XCR BRD8 (SUTURE) ×1 IMPLANT
SUT VIC AB 0 CT1 8-18 (SUTURE) ×6
SUT VIC AB 2-0 CT1 18 (SUTURE) ×3 IMPLANT
SUT VIC AB 3-0 SH 8-18 (SUTURE) ×3 IMPLANT
TOWEL OR 17X24 6PK STRL BLUE (TOWEL DISPOSABLE) ×5 IMPLANT
TOWEL OR 17X26 10 PK STRL BLUE (TOWEL DISPOSABLE) ×3 IMPLANT
TRAY FOLEY W/METER SILVER 16FR (SET/KITS/TRAYS/PACK) ×3 IMPLANT
WATER STERILE IRR 1000ML POUR (IV SOLUTION) ×3 IMPLANT

## 2016-07-02 NOTE — Progress Notes (Signed)
Pt received from PACU drowsy with no noted distress. Surgical dressing site dry and intact.  Pt responds to commands. Safety measures in place. Call bell within reach. Will continue to monitor.

## 2016-07-02 NOTE — Anesthesia Procedure Notes (Signed)
Procedure Name: Intubation Date/Time: 07/02/2016 11:37 AM Performed by: Gwenyth AllegraADAMI, Vernon Severa Pre-anesthesia Checklist: Patient identified, Emergency Drugs available, Suction available, Patient being monitored and Timeout performed Patient Re-evaluated:Patient Re-evaluated prior to inductionOxygen Delivery Method: Circle system utilized Preoxygenation: Pre-oxygenation with 100% oxygen Intubation Type: IV induction Ventilation: Mask ventilation without difficulty and Oral airway inserted - appropriate to patient size Laryngoscope Size: Mac and 4 Grade View: Grade I Tube type: Oral Tube size: 7.5 mm Number of attempts: 1 Airway Equipment and Method: Stylet Placement Confirmation: ETT inserted through vocal cords under direct vision,  positive ETCO2 and breath sounds checked- equal and bilateral Secured at: 22 cm Tube secured with: Tape Dental Injury: Teeth and Oropharynx as per pre-operative assessment

## 2016-07-02 NOTE — Op Note (Signed)
07/02/2016  4:14 PM  PATIENT:  Vernon Fisher  60 y.o. male  PRE-OPERATIVE DIAGNOSIS:  LUMBAR RADICULITIS L4/5  POST-OPERATIVE DIAGNOSIS:  LUMBAR RADICULITIS L4/5  PROCEDURE:  Procedure(s): Lumbar 4,5 nerve root decompression via hemilaminectomy L4 Posterolateral arthrodesis L4/5, auto and allograft Non segmental pedicle screw fixation Nuvasive Relign L4, L5 6.645mm screws    SURGEON:  Surgeon(s): Coletta MemosKyle Jesus Nevills, MD Barnett AbuHenry Elsner, MD  ASSISTANTS:Elsner, Sherilyn CooterHenry  ANESTHESIA:   general  EBL:  Total I/O In: 2504 [I.V.:2504] Out: 775 [Urine:400; Blood:375]  BLOOD ADMINISTERED:none  CELL SAVER GIVEN:none  COUNT:per nursing  DRAINS: none   SPECIMEN:  No Specimen  DICTATION: Vernon Fisher is a 60 y.o. male whom was taken to the operating room intubated, and placed under a general anesthetic without difficulty. A foley catheter was placed under sterile conditions. He was positioned prone on a Jackson stable with all pressure points properly padded.  His lumbar region was prepped and draped in a sterile manner. I infiltrated 20cc's 1/2%lidocaine/1:2000,000 strength epinephrine into the planned incision. I opened the skin with a 10 blade and took the incision down to the thoracolumbar fascia. I exposed the lamina of L3,4,and L5 in a subperiosteal fashion bilaterally. I confirmed my location with an intraoperative xray.  I placed self retaining retractors and started the decompression.  I decompressed the spinal canal via a hemilaminectomy sparing the spinous process and supraspinous ligament of L4. I removed the lamina with the drill and Kerrison punches. I removed the inferior facets of L4 bilaterally in order to decompress the L4 roots bilaterally. I used the Kerrison punches to free the L5 roots caudally. The thecal sac was well decompressed after removing the lamina and ligamentum flavum. I did not open the disc space on either side.   I decorticated the transverse process at L4, and L5.  I then placed allograft and autograft on the decorticated surfaces to complete the posterolateral arthrodesis.  We placed pedicle screws at L4 and L5, using fluoroscopic guidance. I drilled a pilot hole, then cannulated the pedicle with a drill at each site. We then tapped each pedicle, assessing each site for pedicle violations. No cutouts were appreciated. Screws (Nuvasive relign 6.285mm various lengths) were then placed at each site without difficulty. I attached rods and locking caps with the appropriate tools. The locking caps were secured with torque limited screwdrivers. Final films were performed and the final construct appeared to be in good position.  I closed the wound in a layered fashion. I approximated the thoracolumbar fascia, subcutaneous, and subcuticular planes with vicryl sutures. I used dermabond, and an occlusive bandage for a sterile dressing.     PLAN OF CARE: Admit to inpatient   PATIENT DISPOSITION:  PACU - hemodynamically stable.   Delay start of Pharmacological VTE agent (>24hrs) due to surgical blood loss or risk of bleeding:  yes

## 2016-07-02 NOTE — Transfer of Care (Signed)
Immediate Anesthesia Transfer of Care Note  Patient: Vernon KetoRobert L Mcadory  Procedure(s) Performed: Procedure(s): POSTERIOR LUMBAR INTERBODY FUSION UMBAR FOUR-FIVE (N/A)  Patient Location: PACU  Anesthesia Type:General  Level of Consciousness: awake and alert   Airway & Oxygen Therapy: Patient Spontanous Breathing and Patient connected to nasal cannula oxygen  Post-op Assessment: Report given to RN and Post -op Vital signs reviewed and stable  Post vital signs: Reviewed and stable  Last Vitals:  Vitals:   07/02/16 1056  BP: 139/78  Pulse: 64  Resp: 20  Temp: 36.4 C    Last Pain:  Vitals:   07/02/16 1056  TempSrc: Oral  PainSc:          Complications: No apparent anesthesia complications

## 2016-07-02 NOTE — Anesthesia Preprocedure Evaluation (Addendum)
Anesthesia Evaluation  Patient identified by MRN, date of birth, ID band Patient awake    Airway Mallampati: II  TM Distance: >3 FB Neck ROM: Full    Dental  (+) Teeth Intact   Pulmonary former smoker,    breath sounds clear to auscultation       Cardiovascular hypertension,  Rhythm:Regular     Neuro/Psych  Headaches,    GI/Hepatic   Endo/Other  diabetes  Renal/GU      Musculoskeletal   Abdominal   Peds  Hematology   Anesthesia Other Findings   Reproductive/Obstetrics                             Anesthesia Physical Anesthesia Plan  ASA: II  Anesthesia Plan:    Post-op Pain Management:    Induction:   Airway Management Planned:   Additional Equipment:   Intra-op Plan:   Post-operative Plan:   Informed Consent:   Plan Discussed with:   Anesthesia Plan Comments:         Anesthesia Quick Evaluation

## 2016-07-02 NOTE — H&P (Signed)
BP 139/78   Pulse 64   Temp 97.6 F (36.4 C) (Oral)   Resp 20   SpO2 98%  Vernon Fisher is 60 year old gentleman who presents today for evaluation of pain that he has in his neck, left upper extremity, and some pain on the right side. He has a long history of low back pain.Vernon Fisher is a gentleman who has had severe low back pain. I have treated this with epidural injections, oral medication, and time, none of which have been sufficient. He has foraminal narrowing present bilaterally at L4-5. He has pain whenever he stands or tries to walk, and he says the pain is now only getting worse.  DATA: I did do flexion/extension films today and while they show some narrowing around the neural foramen, they do not show instability.    Past Surgical History:  Procedure Laterality Date  . bilateral carpal tunnel release    . BIOPSY  12/23/2015   Procedure: BIOPSY;  Surgeon: [name], MD;  Location: [facility];  Service: Endoscopy;;  duodenal bx gastric bx esophageal bx  . BIOPSY  05/11/2016   Procedure: BIOPSY;  Surgeon: [name], MD;  Location: [facility];  Service: Endoscopy;;  . CERVICAL FUSION     plates and screws  . COLONOSCOPY  06/08/2011   Dr. [name]: normal. Repeat in 10 years  . COLONOSCOPY WITH PROPOFOL N/A 05/11/2016   Procedure: COLONOSCOPY WITH PROPOFOL;  Surgeon: [name], MD;  Location: [facility];  Service: Endoscopy;  Laterality: N/A;  815  . ESOPHAGOGASTRODUODENOSCOPY (EGD) WITH PROPOFOL N/A 12/23/2015   Dr. [name]: moderate Schatzki's ring s/p dilation, small hiatal hernia, benign small bowel biopsies, negative for EOE, mild gastritis  . KNEE ARTHROSCOPY WITH MEDIAL MENISECTOMY Left 09/06/2014   Procedure: KNEE ARTHROSCOPY WITH LIMITED DEBRIDEMENT;  Surgeon: Vickki Hearing, MD;  Location: [facility];  Service: Orthopedics;  Laterality: Left;  . NOSE SURGERY     repair of fracture and deviated septum from fracture  . Right Rotator Cuff repair    . SAVORY  DILATION N/A 12/23/2015   Procedure: SAVORY DILATION;  Surgeon: [name], MD;  Location: [facility];  Service: Endoscopy;  Laterality: N/A;   Past Medical History:  Diagnosis Date  . Anxiety   . Chronic pain   . Degenerative disc disease   . Depression   . Diabetes mellitus without complication (HCC)    diet controlled  . Fibromyalgia   . GERD (gastroesophageal reflux disease)   . Heart murmur    told as a child none since  . Hypertension   . Migraine headache   . Plantar fasciitis   . PONV (postoperative nausea and vomiting)   . Reflux    Prior to Admission medications   Medication Sig Start Date End Date Taking? Authorizing Provider  enalapril (VASOTEC) 10 MG tablet Take 1 tablet (10 mg total) by mouth daily. 03/22/16  Yes Merlyn Albert, MD  escitalopram (LEXAPRO) 10 MG tablet Take 1 tablet (10 mg total) by mouth daily. 03/22/16  Yes Merlyn Albert, MD  naproxen sodium (ANAPROX) 220 MG tablet Take 440 mg by mouth 4 (four) times daily as needed (pain).   Yes Historical Provider, MD  zolpidem (AMBIEN) 10 MG tablet Take 1 tablet (10 mg total) by mouth at bedtime as needed for sleep. 03/22/16 06/23/16 Yes Merlyn Albert, MD  dicyclomine (BENTYL) 10 MG capsule 1 po WITH BREAKFAST AND AT BEDTIME 05/11/16  West BaliSandi L Fields, MD  diphenhydrAMINE (BENADRYL) 25 MG tablet Take 2 tablets 1 hour before the scan. Patient not taking: Reported on 06/23/2016 05/14/16   [name], NP  Multiple Vitamins-Minerals (MULTIVITAMIN PO) Take 1 tablet by mouth daily.    Historical Provider, MD  pantoprazole (PROTONIX) 40 MG tablet Take 1 tablet (40 mg total) by mouth daily. 03/22/16   Merlyn AlbertWilliam S Luking, MD  predniSONE (DELTASONE) 50 MG tablet Take 1 tablet 13 hours before the scan, 7 hours before the scan, and 1 hour before the scan. Take Benadryl 50 mg 1 hour before the scan. Patient not taking: Reported on 06/23/2016 05/14/16   [name], NP   \ PAST MEDICAL HISTORY: Significant for  hypertension and diabetes.  PAST SURGICAL HISTORY: He has undergone right and left carpal tunnel releases, right shoulder rotator cuff surgery, and left knee surgery.  ALLERGIES: AMOXICILLIN AND CLAVULANATE MAKE IT HARD FOR HIM TO BREATHE, CAUSES HIS HEART TO RACE, AND MAKES HIM FEEL FAINT. LATEX CAUSES HIM TO BREAK OUT.  MEDICATIONS: Medications include Celebrex, Enalapril, Escitalopram, Hydrocodone, Methocarbamol, and Pantoprazole.  INTERVAL PFSH: Occupation: He currently is not working. He says he had to stop working to take care of his wife when she was diagnosed with breast cancer. He is right handed. Mother [id number], in good health. Father is deceased, had lung cancer and heart disease. He does not smoke. He does not use alcohol. He does not use illicit drugs.  REVIEW OF SYSTEMS: He feels numbness and tingling in the arms, hands, legs, back, and feet. He says that he has loss of control of his bladder occasionally, but does not ascribe it to anything in the cervical spine. He does have headaches. Review of systems is extensive and as follows: Night sweats, eyeglasses, hearing loss, tinnitus, balance problems, nasal congestion, sinus problems, swelling in his feet and hands, leg pain with walking, chronic cough, shortness of breath, indigestion, nausea, vomiting, change in his bowel habits, arm weakness, leg weakness, back pain, arm pain, leg pain, joint pain, arthritis, neck pain, memory problems, disorientation, difficulty with speech, difficulty concentrating, blurred vision, problems with coordination in the legs, anxiety, depression, and diabetes. He denies allergic and hematologic problems. Pain chart currently shows pain and numbness in both upper extremities, and both lower extremities anteriorly and posteriorly. He says the pain does not ease up.  PHYSICAL EXAMINATION: Strength is full in both upper and lower extremities. Muscle tone, bulk, and coordination are normal. Reflexes 1+ biceps, triceps,  brachial radialis, knees, and ankles. No conus or Hoffman's sign is elicited. Proprioception is intact in the upper extremities. Romberg test is negative. He can toe walk and heel with, though with some difficulty secondary to pain that he has in his lower back. He walks in a stooped fashion, though is stable. Pupils equal, round, and reactive to light. Full extraocular movements. Full visual fields. Symmetric V1 and V2 distribution, slightly diminished at V3 on the right compared to the left. He has hearing which is intact to voice. Uvula elevates in the midline. Shoulder shrug is normal. Tongue protrudes in the midline.  assesment/plan  To address the narrowing in the foramen I would remove so much of the facet that I think that it would necessitate a lumbar fusion. He does not have central stenosis but foraminal narrowing and lateral recess stenosis, so the bony only operation, again I believe will leave him on the verge of instability. We spoke at length about the fact that 3-4  does not look good and that fusing at 4-5 may very well hasten the demise of the 3-4 facet, but we are not going to prophylactically treat it.

## 2016-07-02 NOTE — Anesthesia Preprocedure Evaluation (Addendum)
Anesthesia Evaluation  Patient identified by MRN, date of birth, ID band Patient awake    Reviewed: Allergy & Precautions, NPO status , Patient's Chart, lab work & pertinent test results  History of Anesthesia Complications (+) PONV and history of anesthetic complications  Airway Mallampati: II  TM Distance: >3 FB Neck ROM: Limited    Dental  (+) Teeth Intact, Dental Advisory Given   Pulmonary former smoker,    Pulmonary exam normal breath sounds clear to auscultation       Cardiovascular hypertension, Pt. on medications Normal cardiovascular exam Rhythm:Regular Rate:Normal     Neuro/Psych  Headaches, PSYCHIATRIC DISORDERS Anxiety Depression DDD    GI/Hepatic Neg liver ROS, GERD  Medicated,  Endo/Other  diabetes, Well Controlled, Type 2Obesity   Renal/GU negative Renal ROS     Musculoskeletal  (+) Arthritis , Fibromyalgia -  Abdominal   Peds  Hematology negative hematology ROS (+)   Anesthesia Other Findings Day of surgery medications reviewed with the patient.  Reproductive/Obstetrics                            Anesthesia Physical Anesthesia Plan  ASA: II  Anesthesia Plan: General   Post-op Pain Management:    Induction: Intravenous  Airway Management Planned: Oral ETT  Additional Equipment:   Intra-op Plan:   Post-operative Plan: Extubation in OR  Informed Consent: I have reviewed the patients History and Physical, chart, labs and discussed the procedure including the risks, benefits and alternatives for the proposed anesthesia with the patient or authorized representative who has indicated his/her understanding and acceptance.   Dental advisory given  Plan Discussed with: CRNA  Anesthesia Plan Comments: (Risks/benefits of general anesthesia discussed with patient including risk of damage to teeth, lips, gum, and tongue, nausea/vomiting, allergic reactions to  medications, and the possibility of heart attack, stroke and death.  All patient questions answered.  Patient wishes to proceed.)        Anesthesia Quick Evaluation

## 2016-07-03 NOTE — Progress Notes (Signed)
Called Dr. Dutch QuintPoole due to T max 101.8 oral. He said give ordered tylenol and encourage I.S.

## 2016-07-03 NOTE — Progress Notes (Signed)
Offered to ambulate patient after removing foley...patient refused to ambulate at this time

## 2016-07-03 NOTE — Progress Notes (Signed)
Overall stable. Patient experiencing quite a bit of lumbar pain. No significant radicular pain. Mobilizing slowly.  He is afebrile. His vitals are stable. He is awake and alert. He is oriented and appropriate. His motor and sensory function are intact. Dressing is dry. Abdomen soft.  Progressing well following lumbar decompression and fusion. Continue efforts at mobilization. Possible discharge tomorrow.

## 2016-07-03 NOTE — Progress Notes (Signed)
Temp went up to 101.8 oral. Doing IS 1200 volume Q 1-2 hrs. Rechecked again 10 min. Later, down to 100.9 oral. Started IVF Ns @ hr to help hydrate. T 100.8 oral @ 6pm. See MAR for pain/nausea meds.

## 2016-07-03 NOTE — Evaluation (Signed)
Occupational Therapy Evaluation and Discharge Patient Details Name: Vernon KetoRobert L Fisher MRN: [id number] DOB: [birth year] Today's Date: 07/03/2016    History of Present Illness 60 y.o. male s/p L4-5 PLIF. PMH consists of cervical fusion.   Clinical Impression   Pt reports he was independent with ADL PTA. Currently pt overall supervision for ADL and functional mobility with the exception of min assist for LB ADL. All back, safety, and ADL education completed with pt and wife. Pt planning to d/c home with 24/7 supervision initially from family. No further acute OT needs identified; signing off at this time. Please re-consult if needs change. Thank you for this referral.    Follow Up Recommendations  No OT follow up;Supervision/Assistance - 24 hour (initially)    Equipment Recommendations  None recommended by OT    Recommendations for Other Services       Precautions / Restrictions Precautions Precautions: Back Precaution Booklet Issued: No (PT already provided) Precaution Comments: Pt able to recall 2/3 back precautions. Reviewed all precautions with pt. Restrictions Weight Bearing Restrictions: No      Mobility Bed Mobility Overal bed mobility: Needs Assistance Bed Mobility: Rolling;Sidelying to Sit Rolling: Supervision Sidelying to sit: Supervision       General bed mobility comments: HOB flat with use of bed rail. VCs for log roll technique.  Transfers Overall transfer level: Needs assistance Equipment used: Rolling walker (2 wheeled) Transfers: Sit to/from Stand Sit to Stand: Supervision         General transfer comment: Good hand placement and technique    Balance Overall balance assessment: Needs assistance Sitting-balance support: Feet supported;No upper extremity supported Sitting balance-Leahy Scale: Good     Standing balance support: No upper extremity supported;During functional activity Standing balance-Leahy Scale: Fair                              ADL Overall ADL's : Needs assistance/impaired Eating/Feeding: Independent;Sitting   Grooming: Supervision/safety;Standing;Wash/dry hands Grooming Details (indicate cue type and reason): Educated pt on use of 2 cups for oral care Upper Body Bathing: Set up;Supervision/ safety;Sitting   Lower Body Bathing: Minimal assistance;Sit to/from stand   Upper Body Dressing : Set up;Sitting   Lower Body Dressing: Minimal assistance;Sit to/from stand Lower Body Dressing Details (indicate cue type and reason): Pt unable to cross foot over opposite knee. Educated on compensatory strategies for LB ADL. Wife to assist as needed upon return home. Toilet Transfer: Supervision/safety;Ambulation;Comfort height toilet;RW StatisticianToilet Transfer Details (indicate cue type and reason): Simulated by sit to stand from EOB with functional mobililty. Pt able to stand at toilet to void with supervision.   Toileting - Clothing Manipulation Details (indicate cue type and reason): Educated on peri care technique without twisting and use of wet wipes. Tub/ Shower Transfer: Supervision/safety;Walk-in shower;Ambulation;Shower Dealerseat;Rolling walker Tub/Shower Transfer Details (indicate cue type and reason): Educated on use of shower seat for safety and supervision initiatlly with shower transfers upon return home. Functional mobility during ADLs: Supervision/safety;Rolling walker General ADL Comments: Educated pt on maintaining back precautions during functional activities, log roll for bed mobililty, keeping frequently used items at counter top height, frequent mobility thorughout the day upon return home.     Vision Vision Assessment?: No apparent visual deficits   Perception     Praxis      Pertinent Vitals/Pain Pain Assessment: Faces Faces Pain Scale: Hurts even more Pain Location: back Pain Descriptors / Indicators: Aching;Grimacing Pain Intervention(s): Monitored during  session;Repositioned;RN gave pain  meds during session     Hand Dominance     Extremity/Trunk Assessment Upper Extremity Assessment Upper Extremity Assessment: Overall WFL for tasks assessed   Lower Extremity Assessment Lower Extremity Assessment: Defer to PT evaluation   Cervical / Trunk Assessment Cervical / Trunk Assessment: Other exceptions Cervical / Trunk Exceptions: s/p spinal sx   Communication Communication Communication: No difficulties   Cognition Arousal/Alertness: Awake/alert Behavior During Therapy: WFL for tasks assessed/performed Overall Cognitive Status: Within Functional Limits for tasks assessed                     General Comments       Exercises       Shoulder Instructions      Home Living Family/patient expects to be discharged to:: Private residence Living Arrangements: Spouse/significant other Available Help at Discharge: Family;Available 24 hours/day Type of Home: House Home Access: Stairs to enter Entergy CorporationEntrance Stairs-Number of Steps: 1   Home Layout: One level     Bathroom Shower/Tub: Producer, television/film/videoWalk-in shower   Bathroom Toilet: Handicapped height     Home Equipment: Environmental consultantWalker - 2 wheels;Shower seat - built in          Prior Functioning/Environment Level of Independence: Independent        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        OT Problem List:     OT Treatment/Interventions:      OT Goals(Current goals can be found in the care plan section) Acute Rehab OT Goals Patient Stated Goal: home OT Goal Formulation: All assessment and education complete, DC therapy  OT Frequency:     Barriers to D/C:            Co-evaluation              End of Session Equipment Utilized During Treatment: Rolling walker Nurse Communication: Mobility status  Activity Tolerance: Patient tolerated treatment well Patient left: in chair;with call bell/phone within reach;with family/visitor present   Time: [id number] OT Time Calculation (min): 17 min Charges:  OT  General Charges $OT Visit: 1 Procedure OT Evaluation $OT Eval Moderate Complexity: 1 Procedure G-Codes:     Gaye AlkenBailey A Gotham Raden M.S., OTR/L Pager: [phone]: [phone]  07/03/2016, 1:24 PM

## 2016-07-03 NOTE — Evaluation (Signed)
Physical Therapy Evaluation Patient Details Name: Vernon Vernon Fisher L Vernon Fisher MRN: [id number] DOB: Mar 18, 1956 Today's Date: 07/03/2016   History of Present Illness  60 y.o. male s/p L4-5 PLIF. PMH consists of cervical fusion.  Clinical Impression  Patient is s/p above surgery resulting in the deficits listed below (see PT Problem List). Pt min guard assist for functional mobility during eval. See below for full eval details. Mobility limited by pain. Patient will benefit from skilled PT to increase their independence and safety with mobility (while adhering to their precautions) to allow discharge to the venue listed below.     Follow Up Recommendations No PT follow up;Supervision for mobility/OOB    Equipment Recommendations  None recommended by PT    Recommendations for Other Services       Precautions / Restrictions Precautions Precautions: Back Precaution Booklet Issued: Yes (comment) Precaution Comments: Educated pt on 3/3 back precautions. Handout provided.      Mobility  Bed Mobility               General bed mobility comments: Pt received in recliner.  Transfers Overall transfer level: Needs assistance Equipment used: Rolling walker (2 wheeled) Transfers: Sit to/from Stand Sit to Stand: Min guard         General transfer comment: verbal cues for sequencing and hand placement.  Ambulation/Gait Ambulation/Gait assistance: Min guard Ambulation Distance (Feet): 20 Feet Assistive device: Rolling walker (2 wheeled) Gait Pattern/deviations: Step-through pattern;Decreased stride length Gait velocity: slow, steady gait Gait velocity interpretation: Below normal speed for age/gender General Gait Details: Pt with c/o being light headed. Verbal cues to look forward and keep eyes open.  Stairs            Wheelchair Mobility    Modified Rankin (Stroke Patients Only)       Balance                                             Pertinent  Vitals/Pain Pain Assessment: 0-10 Pain Score: 10-Worst pain ever Pain Location: back Pain Descriptors / Indicators: Aching;Sore Pain Intervention(s): Monitored during session;Premedicated before session    Home Living Family/patient expects to be discharged to:: Private residence Living Arrangements: Spouse/significant other Available Help at Discharge: Family;Available 24 hours/day Type of Home: House Home Access: Stairs to enter   Enter[employer] CorporationEntrance Stairs-Number of Steps: 1 Home Layout: One level Home Equipment: Walker - 2 wheels      Prior Function Level of Independence: Independent         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     Hand Dominance        Extremity/Trunk Assessment                Communication   Communication: No difficulties  Cognition Arousal/Alertness: Lethargic;Suspect due to medications Behavior During Therapy: Flat affect Overall Cognitive Status: Within Functional Limits for tasks assessed                      General Comments      Exercises     Assessment/Plan    PT Assessment Patient needs continued PT services  PT Problem List Decreased strength;Decreased activity tolerance;Decreased mobility;Decreased knowledge of use of DME;Pain;Decreased knowledge of precautions          PT Treatment Interventions DME instruction;Gait training;Stair training;Functional mobility training;Therapeutic activities;Patient/family education    PT Goals (Current goals can  be found in the Care Plan section)  Acute Rehab PT Goals Patient Stated Goal: home PT Goal Formulation: With patient Time For Goal Achievement: 07/10/16 Potential to Achieve Goals: Good    Frequency Min 5X/week   Barriers to discharge        Co-evaluation               End of Session Equipment Utilized During Treatment: Gait belt Activity Tolerance: Patient limited by pain Patient left: in chair;with call bell/phone within reach;with family/visitor  present Nurse Communication: Mobility status;Other (comment) (Pt voided.)         Time: [id number] PT Time Calculation (min) (ACUTE ONLY): 23 min   Charges:   PT Evaluation $PT Eval Moderate Complexity: 1 Procedure PT Treatments $Gait Training: 8-22 mins   PT G Codes:        Ilda FoilGarrow, Danne Scardina Rene 07/03/2016, 9:19 AM

## 2016-07-04 MED ORDER — OXYCODONE-ACETAMINOPHEN 5-325 MG PO TABS: 1.0000 | ORAL_TABLET | ORAL | 0 refills | Status: DC | PRN

## 2016-07-04 MED ORDER — DIAZEPAM 5 MG PO TABS: 5.0000 mg | ORAL_TABLET | Freq: Four times a day (QID) | ORAL | 0 refills | Status: DC | PRN

## 2016-07-04 NOTE — Progress Notes (Signed)
No issues overnight. Pt reports appropriate back pain, ambulating ok. Voiding ok.  EXAM:  BP 126/79 (BP Location: Left Arm)   Pulse 76   Temp 98.5 F (36.9 C) (Oral)   Resp 20   Ht 5\' 11"  (1.803 m)   Wt 100.4 kg (221 lb 5.5 oz)   SpO2 93%   BMI 30.87 kg/m   Awake, alert, oriented  Speech fluent, appropriate  CN grossly intact  5/5 BUE/BLE   IMPRESSION:  60 y.o. male POD# 2 s/p L4-5 decompression, posterolateral fusion. Doing well  PLAN: - d/c home today

## 2016-07-04 NOTE — Progress Notes (Signed)
Physical Therapy Treatment Patient Details Name: Vernon KetoRobert L Fisher MRN: [id number] DOB: 1955/07/16 Today's Date: 07/04/2016    History of Present Illness 60 y.o. male s/p L4-5 PLIF. PMH consists of cervical fusion.    PT Comments    Pt making progress with mobility but cont's to be limited by pain.  Ambulated 5750' with rw with min guard for safety with very slow gt speed and guarded but is steady.  Completed sitting EOB>sidelying with min guard with cues for log rolling technique to ensure adherence to back precautions.  Cont with current POC to maximize safety & independence with functional mobility prior to d/c home with wife when MD feels medically ready.      Follow Up Recommendations  No PT follow up;Supervision for mobility/OOB     Equipment Recommendations  None recommended by PT    Recommendations for Other Services       Precautions / Restrictions Precautions Precautions: Back Precaution Booklet Issued: No Precaution Comments: Pt able to recall 2/3 back precautions.  reviewed all 3.   Restrictions Weight Bearing Restrictions: No    Mobility  Bed Mobility Overal bed mobility: Needs Assistance Bed Mobility: Sit to Sidelying Rolling: Min guard       Sit to sidelying: Min guard General bed mobility comments: HOB flat and rails removed.  Cues for logrolling technique.    Transfers Overall transfer level: Needs assistance Equipment used: Rolling walker (2 wheeled) Transfers: Sit to/from Stand Sit to Stand: Supervision         General transfer comment: increased time but able to demonstrate safe technique.   Ambulation/Gait Ambulation/Gait assistance: Min guard Ambulation Distance (Feet): 50 Feet Assistive device: Rolling walker (2 wheeled) Gait Pattern/deviations: Step-through pattern;Decreased stride length Gait velocity: very slow   General Gait Details: very slow and guarded gt with decreased steps bilaterally.     Stairs             Wheelchair Mobility    Modified Rankin (Stroke Patients Only)       Balance                                    Cognition Arousal/Alertness: Awake/alert Behavior During Therapy: WFL for tasks assessed/performed Overall Cognitive Status: Within Functional Limits for tasks assessed                      Exercises      General Comments        Pertinent Vitals/Pain Pain Assessment: 0-10 Pain Score: 10-Worst pain ever Faces Pain Scale: Hurts even more Pain Location: back Pain Descriptors / Indicators: Aching;Grimacing;Guarding Pain Intervention(s): Limited activity within patient's tolerance;Monitored during session;Repositioned;RN gave pain meds during session;Patient requesting pain meds-RN notified;Relaxation    Home Living                      Prior Function            PT Goals (current goals can now be found in the care plan section) Acute Rehab PT Goals Patient Stated Goal: home PT Goal Formulation: With patient Time For Goal Achievement: 07/10/16 Potential to Achieve Goals: Good Progress towards PT goals: Progressing toward goals (slowly)    Frequency    Min 5X/week      PT Plan Current plan remains appropriate    Co-evaluation  End of Session Equipment Utilized During Treatment: Gait belt Activity Tolerance: Patient limited by pain;Patient limited by fatigue Patient left: in bed;with call bell/phone within reach;with family/visitor present     Time: 0840-0905 PT Time Calculation (min) (ACUTE ONLY): 25 min  Charges:  $Gait Training: 8-22 mins $Therapeutic Activity: 8-22 mins                    G Codes:      Lara MulchCooper, Corneisha Alvi Lynn 07/04/2016, 9:41 AM   Verdell FaceKelly Gonzalo Waymire, PTA  07/04/2016

## 2016-07-04 NOTE — Discharge Summary (Signed)
  Physician Discharge Summary  Patient ID: Vernon Fisher MRN: [id number] DOB/AGE: [birth year] 58 y.o.  Admit date: 07/02/2016 Discharge date: 07/04/2016  Admission Diagnoses:  Lumbar spondylosis with radiculopathy  Discharge Diagnoses:  Same Active Problems:   Osteoarthritis of spine with radiculopathy, lumbar region   Discharged Condition: Stable  Hospital Course:  Vernon Fisher is a 60 y.o. male electively admitted after L4-5 decompression and posterolateral fusion. His hospital course was unremarkable. He was ambulating well, tolerating diet, voiding normally.  Treatments: Surgery - L4-5 decompression, posterolateral fusion  Discharge Exam: Blood pressure 126/79, pulse 76, temperature 98.5 F (36.9 C), temperature source Oral, resp. rate 20, height 5\' 11"  (1.803 m), weight 100.4 kg (221 lb 5.5 oz), SpO2 93 %. Awake, alert, oriented Speech fluent, appropriate CN grossly intact 5/5 BUE/BLE Wound c/d/i  Disposition: 01-Home or Self Care   Allergies as of 07/04/2016      Reactions   Augmentin [amoxicillin-pot Clavulanate] Hives, Shortness Of Breath   Has patient had a PCN reaction causing immediate rash, facial/tongue/throat swelling, SOB or lightheadedness with hypotension: # # # YES # # #  Has patient had a PCN reaction causing severe rash involving mucus membranes or skin necrosis: # # # YES # # #  Has patient had a PCN reaction that required hospitalization No Has patient had a PCN reaction occurring within the last 10 years: # # # YES # # #  If all of the above answers are "NO", then may proceed with Cephalosporin use.   Shrimp [shellfish Allergy] Anaphylaxis, Shortness Of Breath   Latex Itching, Rash, Other (See Comments)   MOUTH ULCERS      Medication List    STOP taking these medications   predniSONE 50 MG tablet Commonly known as:  DELTASONE     TAKE these medications   diazepam 5 MG tablet Commonly known as:  VALIUM Take 1 tablet (5 mg total) by  mouth every 6 (six) hours as needed for muscle spasms.   dicyclomine 10 MG capsule Commonly known as:  BENTYL 1 po WITH BREAKFAST AND AT BEDTIME   diphenhydrAMINE 25 MG tablet Commonly known as:  BENADRYL Take 2 tablets 1 hour before the scan.   enalapril 10 MG tablet Commonly known as:  VASOTEC Take 1 tablet (10 mg total) by mouth daily.   escitalopram 10 MG tablet Commonly known as:  LEXAPRO Take 1 tablet (10 mg total) by mouth daily.   MULTIVITAMIN PO Take 1 tablet by mouth daily.   naproxen sodium 220 MG tablet Commonly known as:  ANAPROX Take 440 mg by mouth 4 (four) times daily as needed (pain).   oxyCODONE-acetaminophen 5-325 MG tablet Commonly known as:  PERCOCET/ROXICET Take 1 tablet by mouth every 4 (four) hours as needed for moderate pain.   pantoprazole 40 MG tablet Commonly known as:  PROTONIX Take 1 tablet (40 mg total) by mouth daily.   zolpidem 10 MG tablet Commonly known as:  AMBIEN Take 1 tablet (10 mg total) by mouth at bedtime as needed for sleep.      Follow-up Information    CABBELL,KYLE L, MD Follow up in 3 week(s).   Specialty:  Neurosurgery Contact information: [address] [place] [address] [phone]           Signed: Lisbeth RenshawUNDKUMAR, Addisynn Vassell, Salena SanerC 07/04/2016, 10:33 AM

## 2016-07-04 NOTE — Progress Notes (Signed)
Home now w/sharon, wife.

## 2016-07-06 ENCOUNTER — Encounter (HOSPITAL_COMMUNITY): Payer: Self-pay | Admitting: Neurosurgery

## 2016-07-06 NOTE — Anesthesia Postprocedure Evaluation (Signed)
Anesthesia Post Note  Patient: Vernon KetoRobert L Fisher  Procedure(s) Performed: Procedure(s) (LRB): POSTERIOR LUMBAR INTERBODY FUSION UMBAR FOUR-FIVE (N/A)  Patient location during evaluation: PACU Anesthesia Type: General Level of consciousness: awake and alert and patient cooperative Pain management: pain level controlled Vital Signs Assessment: post-procedure vital signs reviewed and stable Respiratory status: spontaneous breathing and respiratory function stable Cardiovascular status: stable Anesthetic complications: no       Last Vitals:  Vitals:   07/04/16 0540 07/04/16 0940  BP: 135/75 126/79  Pulse: 72 76  Resp: 20 20  Temp: 37.2 C 36.9 C    Last Pain:  Vitals:   07/04/16 0940  TempSrc: Oral  PainSc:                  Sangeeta Youse S

## 2016-07-06 NOTE — Addendum Note (Signed)
Addendum  created 07/06/16 1548 by Achille RichAdam Michelene Keniston, MD   SmartForm saved

## 2016-07-09 DIAGNOSIS — M48062 Spinal stenosis, lumbar region with neurogenic claudication: Secondary | ICD-10-CM | POA: Diagnosis not present

## 2016-07-20 ENCOUNTER — Ambulatory Visit: Payer: BLUE CROSS/BLUE SHIELD | Admitting: Urology

## 2016-08-17 DIAGNOSIS — H40002 Preglaucoma, unspecified, left eye: Secondary | ICD-10-CM | POA: Diagnosis not present

## 2016-08-17 DIAGNOSIS — H40013 Open angle with borderline findings, low risk, bilateral: Secondary | ICD-10-CM | POA: Diagnosis not present

## 2016-08-26 DIAGNOSIS — M5416 Radiculopathy, lumbar region: Secondary | ICD-10-CM | POA: Diagnosis not present

## 2016-09-03 IMAGING — MR MR LUMBAR SPINE W/O CM
4 of 5 series · 15 of 48 positions shown · non-contrast
Comparison: Lumbar MRI 06/14/2011

CLINICAL DATA: Low back pain

EXAM:
MRI LUMBAR SPINE WITHOUT CONTRAST
TECHNIQUE: Multiplanar, multisequence MR imaging of the lumbar spine was
performed. No intravenous contrast was administered.

[Series 3: T2 · sagittal · 4.0mm · 0.78mm/px · 6 of 17 slices shown (1 of 2)]
[im 1/17]
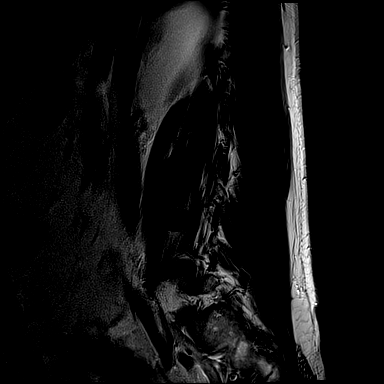
[im 4/17]
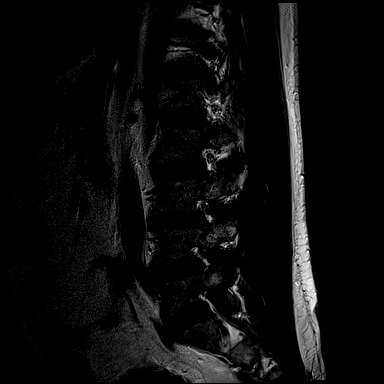
[im 7/17]
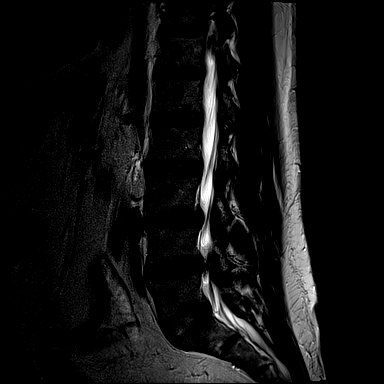
[im 10/17]
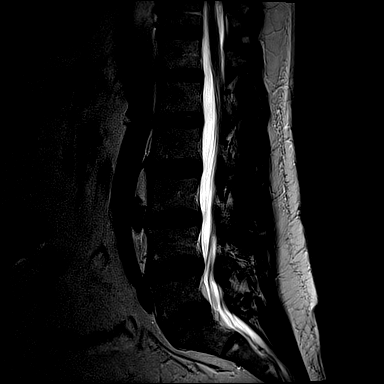
[im 13/17]
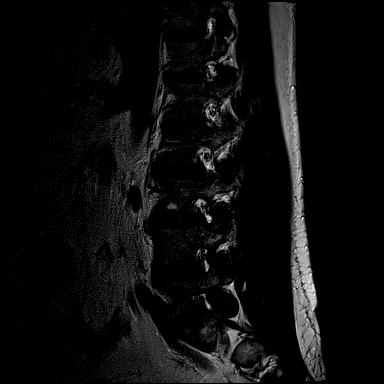
[im 17/17]
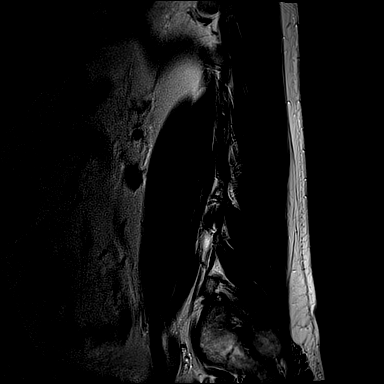

[Series 4: T1 · sagittal · 4.0mm · 0.43mm/px · 3 of 17 slices shown (1 of 2)]
[im 4/17]
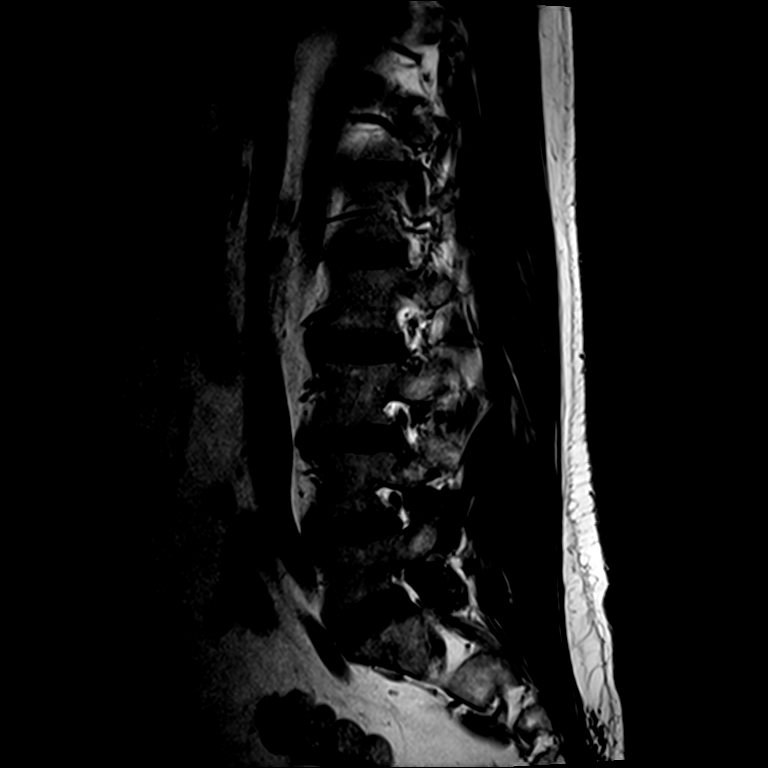
[im 10/17]
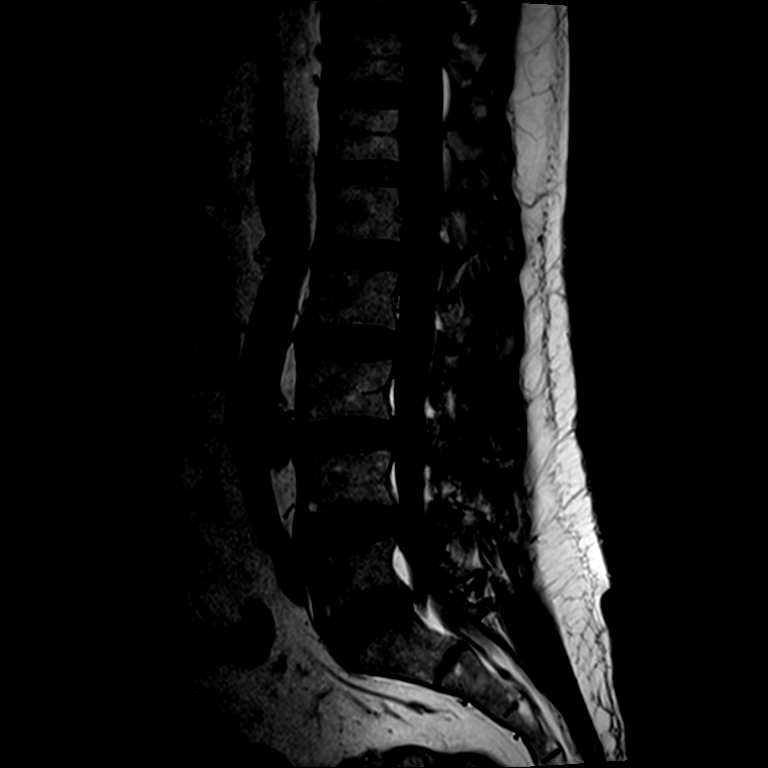
[im 17/17]
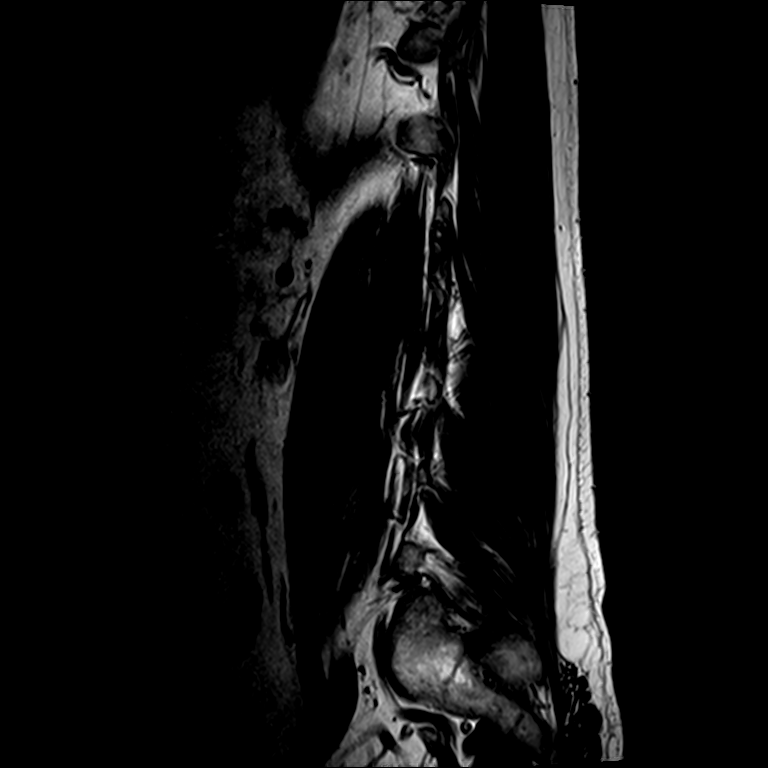

[Series 6: T2 · axial · 4.0mm · 0.23mm/px · z∈[-118,+34]mm · 3 of 43 slices shown (2 of 2)]
[im 7/43]
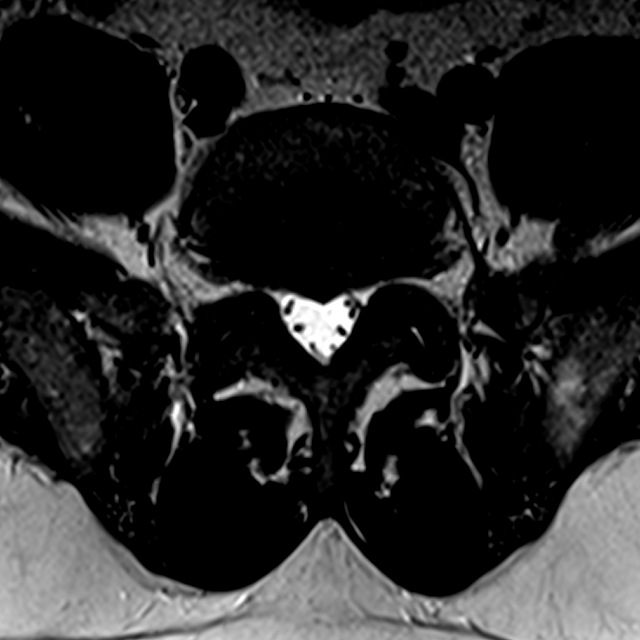
[im 22/43]
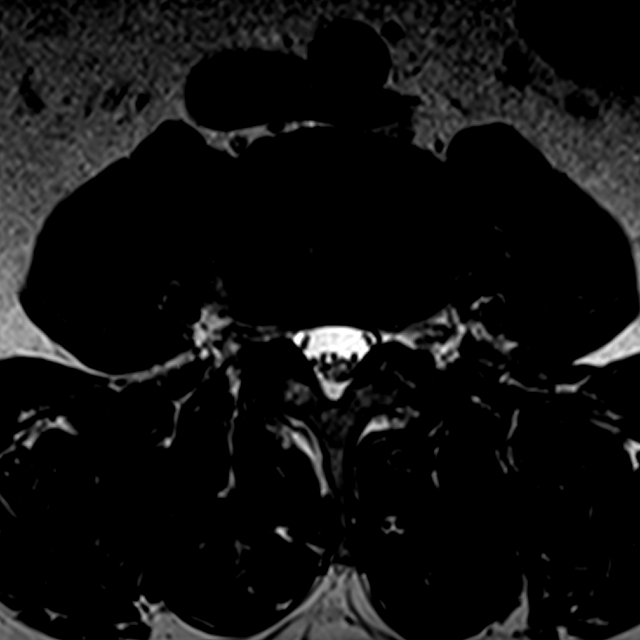
[im 37/43]
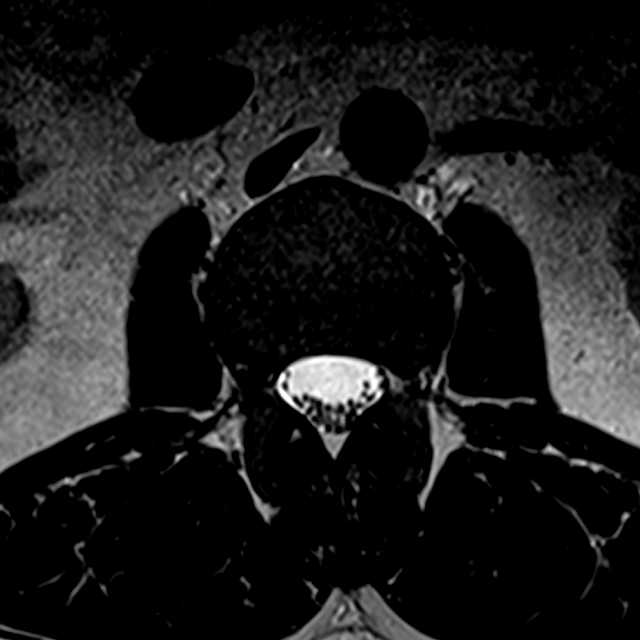

[Series 7: T1 · axial · 4.0mm · 0.23mm/px · z∈[-118,+34]mm · 3 of 43 slices shown (2 of 2)]
[im 7/43]
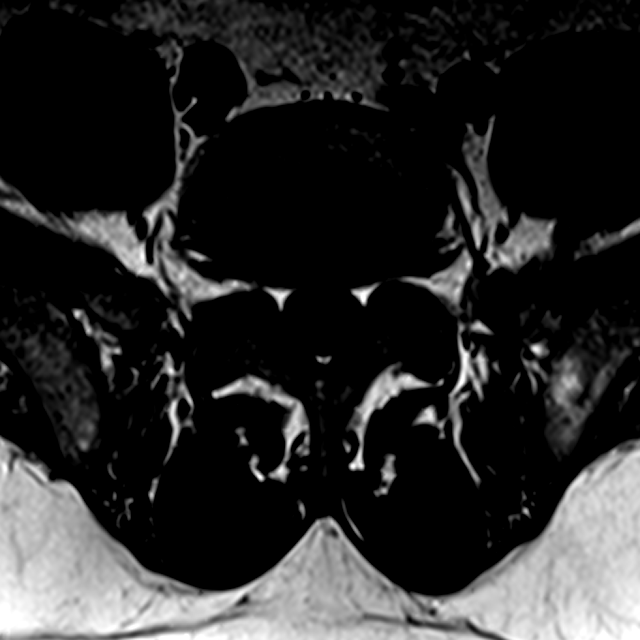
[im 22/43]
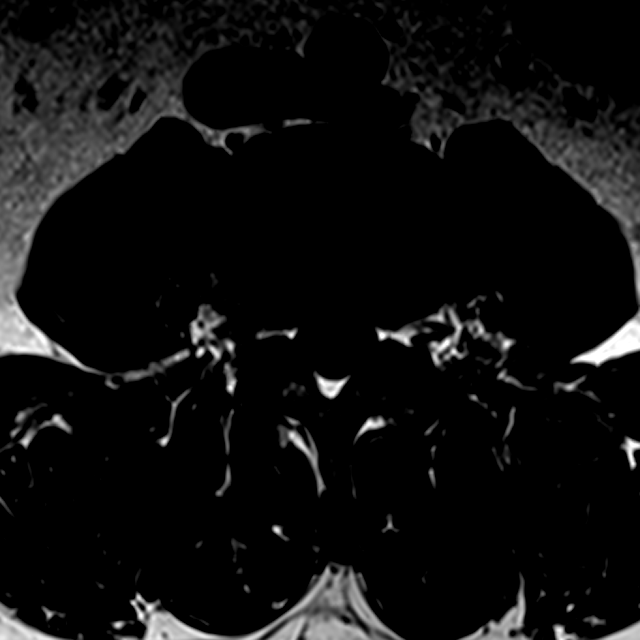
[im 37/43]
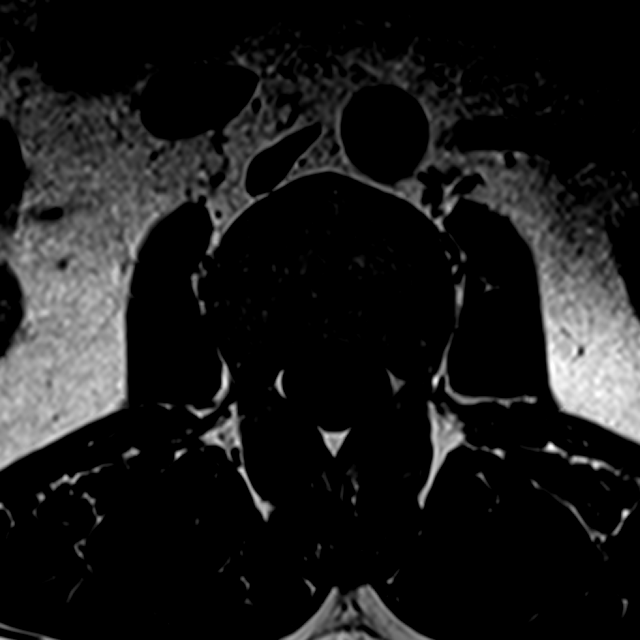

[15 of 48 positions shown; findings below may reference images not displayed]

FINDINGS: Segmentation:  Normal segmentation.  Lowest disc space L5-S1

Alignment:  Normal alignment

Vertebrae:  Negative for fracture or mass.  Normal bone marrow

Conus medullaris: Extends to the L1-2 level and appears normal.

Paraspinal and other soft tissues: Paraspinous muscles are symmetric
and normal in size and signal. No retroperitoneal mass or
adenopathy. Abdominal aorta normal in caliber.

Disc levels:

L1-2:  Negative

L2-3:  Mild disc bulging without stenosis or disc protrusion

L3-4: Diffuse disc bulging. Shallow disc protrusion lateral to the
foramen on the right similar to the prior study. Mild facet
degeneration. Mild spinal stenosis and mild foraminal stenosis
bilaterally unchanged

L4-5: Diffuse disc bulging. Annular fissure on the left is
unchanged. Bilateral facet degeneration. Mild spinal stenosis and
mild foraminal stenosis bilaterally unchanged from the prior study

L5-S1: Normal disc. Mild facet degeneration without significant
stenosis.
IMPRESSION: Mild degenerative changes in the lumbar spine similar to 2232. Mild
spinal and mild foraminal stenosis bilaterally L3-4 and L4-5.

## 2016-09-08 ENCOUNTER — Ambulatory Visit: Payer: BLUE CROSS/BLUE SHIELD | Admitting: Gastroenterology

## 2016-09-20 ENCOUNTER — Encounter: Payer: Self-pay | Admitting: Family Medicine

## 2016-09-20 ENCOUNTER — Ambulatory Visit (INDEPENDENT_AMBULATORY_CARE_PROVIDER_SITE_OTHER): Payer: BLUE CROSS/BLUE SHIELD | Admitting: Family Medicine

## 2016-09-20 VITALS — BP 118/68 | Ht 71.0 in | Wt 221.8 lb

## 2016-09-20 DIAGNOSIS — I10 Essential (primary) hypertension: Secondary | ICD-10-CM | POA: Diagnosis not present

## 2016-09-20 DIAGNOSIS — G894 Chronic pain syndrome: Secondary | ICD-10-CM | POA: Diagnosis not present

## 2016-09-20 DIAGNOSIS — F339 Major depressive disorder, recurrent, unspecified: Secondary | ICD-10-CM

## 2016-09-20 DIAGNOSIS — F5101 Primary insomnia: Secondary | ICD-10-CM

## 2016-09-20 DIAGNOSIS — K219 Gastro-esophageal reflux disease without esophagitis: Secondary | ICD-10-CM

## 2016-09-20 DIAGNOSIS — Z125 Encounter for screening for malignant neoplasm of prostate: Secondary | ICD-10-CM

## 2016-09-20 DIAGNOSIS — M797 Fibromyalgia: Secondary | ICD-10-CM | POA: Diagnosis not present

## 2016-09-20 DIAGNOSIS — Z13220 Encounter for screening for lipoid disorders: Secondary | ICD-10-CM

## 2016-09-20 DIAGNOSIS — G4700 Insomnia, unspecified: Secondary | ICD-10-CM | POA: Insufficient documentation

## 2016-09-20 DIAGNOSIS — R7301 Impaired fasting glucose: Secondary | ICD-10-CM

## 2016-09-20 MED ORDER — KETOCONAZOLE 2 % EX CREA: 1.0000 "application " | TOPICAL_CREAM | Freq: Two times a day (BID) | CUTANEOUS | 2 refills | Status: DC

## 2016-09-20 MED ORDER — TRIAMCINOLONE ACETONIDE 0.1 % EX CREA: 1.0000 "application " | TOPICAL_CREAM | Freq: Two times a day (BID) | CUTANEOUS | 2 refills | Status: DC

## 2016-09-20 MED ORDER — ENALAPRIL MALEATE 10 MG PO TABS: 10.0000 mg | ORAL_TABLET | Freq: Every day | ORAL | 5 refills | Status: DC

## 2016-09-20 MED ORDER — ESCITALOPRAM OXALATE 10 MG PO TABS: 10.0000 mg | ORAL_TABLET | Freq: Every day | ORAL | 5 refills | Status: DC

## 2016-09-20 MED ORDER — PANTOPRAZOLE SODIUM 40 MG PO TBEC: 40.0000 mg | DELAYED_RELEASE_TABLET | Freq: Every day | ORAL | 5 refills | Status: DC

## 2016-09-20 NOTE — Progress Notes (Signed)
Subjective:     Patient ID: Vernon Fisher, male   DOB: 1955/09/28, 61 y.o.   MRN: [id number]  patint arrives office wihnumerous ocerns HPI Blood pressure medicine and blood pressure levels reviewed today with patient. Compliant with blood pressure medicine. States does not miss a dose. No obvious side effects. Blood pressure generally good when checked elsewhere. Watching salt intake.  Patient compliant with insomnia medication. Generally takes most nights. No obvious morning drowsiness. Definitely helps patient sleep. Without it patient states would not get a good nights rest.  Patient notes ongoing compliance with antidepressant medication. No obvious side effects. Reports does not miss a dose. Overall continues to help depression substantially. No thoughts of homicide or suicide. Would like to maintain medication.  Exercising ome, but not a lot, beaues of chornic pain in legs and back  Pt not working right now, hoping to get bk to work some day, has tried for disability without success  Reflux severe  Takes all meds faithfully,     Had lumb disc surgery in dec, helped a little  Review of Systems No headache, no major weight loss or weight gain, no chest pain no back pain abdominal pain no change in bowel habits complete ROS otherwise negative     Objective:   Physical Exam Alert and oriented, vitals reviewed and stable, NAD ENT-TM's and ext canals WNL bilat via otoscopic exam Soft palate, tonsils and post pharynx WNL via oropharyngeal exam Neck-symmetric, no masses; thyroid nonpalpable and nontender Pulmonary-no tachypnea or accessory muscle use; Clear without wheezes via auscultation Card--no abnrml murmurs, rhythm reg and rate WNL Carotid pulses symmetric, without bruits     Assessment:    impression 1 hypertension good control discussed maintain same meds #2 reflux ongoing challenges. Definitely needs medication. Have asked him to try to back off to every every other day  rationale discussed at length #3 insomnia generally good control needs medicines medicines refilled M her for depression clinically stable compliance meds deftly helping plan as noted above. Plus appropriate blood work. Diet exercise discussed. Medications refilled. Recheck in 6 months for wellness plus chronic visit    Plan:

## 2016-09-21 DIAGNOSIS — I10 Essential (primary) hypertension: Secondary | ICD-10-CM | POA: Diagnosis not present

## 2016-09-21 DIAGNOSIS — Z13220 Encounter for screening for lipoid disorders: Secondary | ICD-10-CM | POA: Diagnosis not present

## 2016-09-21 DIAGNOSIS — R7301 Impaired fasting glucose: Secondary | ICD-10-CM | POA: Diagnosis not present

## 2016-09-21 DIAGNOSIS — Z125 Encounter for screening for malignant neoplasm of prostate: Secondary | ICD-10-CM | POA: Diagnosis not present

## 2016-09-22 LAB — BASIC METABOLIC PANEL
BUN/Creatinine Ratio: 14 (ref 9–20)
BUN: 10 mg/dL (ref 6–24)
CALCIUM: 9.2 mg/dL (ref 8.7–10.2)
CHLORIDE: 101 mmol/L (ref 96–106)
CO2: 28 mmol/L (ref 18–29)
Creatinine, Ser: 0.72 mg/dL — ABNORMAL LOW (ref 0.76–1.27)
GFR calc Af Amer: 119 mL/min/{1.73_m2} (ref >59)
GFR calc non Af Amer: 103 mL/min/{1.73_m2} (ref >59)
Glucose: 117 mg/dL — ABNORMAL HIGH (ref 65–99)
POTASSIUM: 4.7 mmol/L (ref 3.5–5.2)
Sodium: 142 mmol/L (ref 134–144)

## 2016-09-22 LAB — LIPID PANEL
CHOLESTEROL TOTAL: 207 mg/dL — AB (ref 100–199)
Chol/HDL Ratio: 4.8 ratio units (ref 0.0–5.0)
HDL: 43 mg/dL (ref >39)
LDL CALC: 143 mg/dL — AB (ref 0–99)
TRIGLYCERIDES: 103 mg/dL (ref 0–149)
VLDL Cholesterol Cal: 21 mg/dL (ref 5–40)

## 2016-09-22 LAB — HEPATIC FUNCTION PANEL
ALT: 28 IU/L (ref 0–44)
AST: 23 IU/L (ref 0–40)
Albumin: 4.5 g/dL (ref 3.5–5.5)
Alkaline Phosphatase: 104 IU/L (ref 39–117)
Bilirubin Total: 0.5 mg/dL (ref 0.0–1.2)
Bilirubin, Direct: 0.15 mg/dL (ref 0.00–0.40)
Total Protein: 7 g/dL (ref 6.0–8.5)

## 2016-09-22 LAB — PSA: PROSTATE SPECIFIC AG, SERUM: 0.5 ng/mL (ref 0.0–4.0)

## 2016-09-26 ENCOUNTER — Encounter: Payer: Self-pay | Admitting: Family Medicine

## 2016-10-21 DIAGNOSIS — Z6832 Body mass index (BMI) 32.0-32.9, adult: Secondary | ICD-10-CM | POA: Diagnosis not present

## 2016-10-21 DIAGNOSIS — R030 Elevated blood-pressure reading, without diagnosis of hypertension: Secondary | ICD-10-CM | POA: Diagnosis not present

## 2016-10-21 DIAGNOSIS — M4722 Other spondylosis with radiculopathy, cervical region: Secondary | ICD-10-CM | POA: Diagnosis not present

## 2016-10-27 ENCOUNTER — Other Ambulatory Visit (HOSPITAL_COMMUNITY): Payer: Self-pay | Admitting: Neurosurgery

## 2016-10-27 DIAGNOSIS — M4722 Other spondylosis with radiculopathy, cervical region: Secondary | ICD-10-CM

## 2016-11-03 ENCOUNTER — Ambulatory Visit (HOSPITAL_COMMUNITY)
Admission: RE | Admit: 2016-11-03 | Discharge: 2016-11-03 | Disposition: A | Discharge status: Home / Self Care | Payer: BLUE CROSS/BLUE SHIELD | Source: Ambulatory Visit | Attending: Neurosurgery | Admitting: Neurosurgery

## 2016-11-03 DIAGNOSIS — M4722 Other spondylosis with radiculopathy, cervical region: Secondary | ICD-10-CM | POA: Diagnosis not present

## 2016-11-03 DIAGNOSIS — M542 Cervicalgia: Secondary | ICD-10-CM | POA: Diagnosis not present

## 2016-11-03 DIAGNOSIS — Z981 Arthrodesis status: Secondary | ICD-10-CM | POA: Insufficient documentation

## 2016-11-08 DIAGNOSIS — M4722 Other spondylosis with radiculopathy, cervical region: Secondary | ICD-10-CM | POA: Diagnosis not present

## 2016-11-18 ENCOUNTER — Other Ambulatory Visit: Payer: Self-pay | Admitting: Family Medicine

## 2016-11-30 DIAGNOSIS — L03119 Cellulitis of unspecified part of limb: Secondary | ICD-10-CM | POA: Diagnosis not present

## 2016-12-02 DIAGNOSIS — L03119 Cellulitis of unspecified part of limb: Secondary | ICD-10-CM | POA: Diagnosis not present

## 2016-12-02 DIAGNOSIS — L0291 Cutaneous abscess, unspecified: Secondary | ICD-10-CM | POA: Diagnosis not present

## 2016-12-08 ENCOUNTER — Ambulatory Visit (HOSPITAL_COMMUNITY): Payer: BLUE CROSS/BLUE SHIELD | Attending: Neurosurgery

## 2016-12-08 ENCOUNTER — Encounter (HOSPITAL_COMMUNITY): Payer: Self-pay

## 2016-12-08 DIAGNOSIS — M5412 Radiculopathy, cervical region: Secondary | ICD-10-CM

## 2016-12-08 DIAGNOSIS — M542 Cervicalgia: Secondary | ICD-10-CM | POA: Diagnosis not present

## 2016-12-08 DIAGNOSIS — R293 Abnormal posture: Secondary | ICD-10-CM | POA: Diagnosis not present

## 2016-12-08 DIAGNOSIS — R29898 Other symptoms and signs involving the musculoskeletal system: Secondary | ICD-10-CM | POA: Insufficient documentation

## 2016-12-08 NOTE — Therapy (Signed)
[facility] [address] [place], [place], [address] Phone: [phone]   Fax:  [phone]  Physical Therapy Evaluation  Patient Details  Name: Vernon Fisher MRN: [id number] Date of Birth: 1956/02/17 Referring Provider: Coletta Memos, MD  Encounter Date: 12/08/2016      PT End of Session - 12/08/16 1421    Visit Number 1   Number of Visits 13   Date for PT Re-Evaluation 01/19/17   Authorization Type BC[employer] other   Authorization Time Period 12/08/16 to 01/19/17   PT Start Time 1120   PT Stop Time 1204   PT Time Calculation (min) 44 min   Activity Tolerance Patient tolerated treatment well   Behavior During Therapy [facility] for tasks assessed/performed      Past Medical History:  Diagnosis Date  . Anxiety   . Chronic pain   . Degenerative disc disease   . Depression   . Diabetes mellitus without complication (HCC)    diet controlled  . Fibromyalgia   . GERD (gastroesophageal reflux disease)   . Heart murmur    told as a child none since  . Hypertension   . Migraine headache   . Plantar fasciitis   . PONV (postoperative nausea and vomiting)   . Reflux     Past Surgical History:  Procedure Laterality Date  . bilateral carpal tunnel release    . BIOPSY  12/23/2015   Procedure: BIOPSY;  Surgeon: [name], MD;  [facility];  Service: Endoscopy;;  duodenal bx gastric bx esophageal bx  . BIOPSY  05/11/2016   Procedure: BIOPSY;  Surgeon: [name], MD;  [facility];  Service: Endoscopy;;  . CERVICAL FUSION     plates and screws  . COLONOSCOPY  06/08/2011   Dr. [name]: normal. Repeat in 10 years  . COLONOSCOPY WITH PROPOFOL N/A 05/11/2016   Procedure: COLONOSCOPY WITH PROPOFOL;  Surgeon: [name], MD;  [facility];  Service: Endoscopy;  Laterality: N/A;  815  . ESOPHAGOGASTRODUODENOSCOPY (EGD) WITH PROPOFOL N/A 12/23/2015   Dr. [name]: moderate Schatzki's ring s/p dilation, small  hiatal hernia, benign small bowel biopsies, negative for EOE, mild gastritis  . KNEE ARTHROSCOPY WITH MEDIAL MENISECTOMY Left 09/06/2014   Procedure: KNEE ARTHROSCOPY WITH LIMITED DEBRIDEMENT;  Surgeon: Vickki Hearing, MD;  Location: [facility];  Service: Orthopedics;  Laterality: Left;  . NOSE SURGERY     repair of fracture and deviated septum from fracture  . Right Rotator Cuff repair    . SAVORY DILATION N/A 12/23/2015   Procedure: SAVORY DILATION;  Surgeon: [name], MD;  [facility];  Service: Endoscopy;  Laterality: N/A;    There were no vitals filed for this visit.       Subjective Assessment - 12/08/16 1123    Subjective Pt states that he has been having constant neck pain for the last few months. He had a C5-6 fusion sometime last year. He reports he was doing well following the surgery but states he must have done something to aggravate his pain, but he is not sure what. He states it is a constant pain in his neck and has n/t that radiates into both arms all the way to his fingertips. He had this n/t prior to the surgery. He states that he drops things all the time which has progressively gotten worse over the last few months. He denies b/b changes. He states he is having  the most difficulty with turning his head, L>R. He has difficulty driving sleeping, looking up and down, and looking at the computer. Lifting anything above his head is really rough.   Limitations House hold activities;Lifting   How long can you sit comfortably? no issues   How long can you stand comfortably? no issues   How long can you walk comfortably? no issues   Patient Stated Goals improve ROM, stiffness, strengthening   Currently in Pain? Yes   Pain Score 7    Pain Location Neck   Pain Orientation Left   Pain Descriptors / Indicators Stabbing   Pain Type Chronic pain   Pain Radiating Towards tingling in L hand currently   Pain Onset More than a month ago   Pain Frequency Constant    Aggravating Factors  movement in general, laying down   Pain Relieving Factors nothing   Effect of Pain on Daily Activities increases            [facility] PT Assessment - 12/08/16 0001      Assessment   Medical Diagnosis other spondylosis with radiculopathy, cervical region   Referring Provider Coletta Memos, MD   Onset Date/Surgical Date --  worsened over the last few months   Hand Dominance Right   Next MD Visit none; follow up PRN   Prior Therapy not for present issue     Precautions   Precautions None     Balance Screen   Has the patient fallen in the past 6 months No   Has the patient had a decrease in activity level because of a fear of falling?  No   Is the patient reluctant to leave their home because of a fear of falling?  No     Prior Function   Level of Independence Independent;Independent with basic ADLs     Observation/Other Assessments   Observations pt noted to maintain L shoulder hike throughout session     Sensation   Light Touch Appears Intact     Posture/Postural Control   Posture/Postural Control Postural limitations   Postural Limitations Rounded Shoulders;Forward head;Increased thoracic kyphosis     ROM / Strength   AROM / PROM / Strength AROM;Strength;PROM     PROM   Overall PROM Comments supine cervical LF, rotation and flexion > cervical AROM and decreased pain compared to AROM     Strength   Right Shoulder Flexion 4+/5   Right Shoulder ABduction 4+/5   Right Shoulder Internal Rotation 5/5   Right Shoulder External Rotation 5/5   Left Shoulder Flexion 4+/5   Left Shoulder ABduction 4+/5   Left Shoulder Internal Rotation 5/5   Left Shoulder External Rotation 5/5   Right Elbow Flexion 5/5   Right Elbow Extension 5/5   Left Elbow Flexion 5/5   Left Elbow Extension 5/5   Right Wrist Flexion 5/5   Right Wrist Extension 5/5   Left Wrist Flexion 5/5   Left Wrist Extension 5/5   Right Hand Gross Grasp Functional   Right Hand Grip (lbs) 102    Left Hand Gross Grasp Functional   Left Hand Grip (lbs) 94     Palpation   Spinal mobility mild hypomobility C1-T7; increased tenderness to Grade I CPAs throughout, C4-6 recreated L hand tingling which worsened with prolonged bout   Palpation comment increased soft tissue restrictions of cervical and thoracic parspinals, periscapular musculature, and subocciptals bilaterally, but L side throughout was tender to palpation; bil isometric cervical rotation decreased pt's pain  Special Tests    Special Tests Cervical   Cervical Tests Spurling's;Dictraction     Spurling's   Findings Negative   Comment R and L     Distraction Test   Findngs Positive   Comment reduced pt's symptoms            Objective measurements completed on examination: See above findings.                  PT Education - 12/08/16 1252    Education provided Yes   Education Details exam findings, POC, HEP   Person(s) Educated Patient   Methods Explanation;Demonstration;Handout   Comprehension Verbalized understanding;Returned demonstration          PT Short Term Goals - 12/08/16 1425      PT SHORT TERM GOAL #1   Title Pt will be independent with HEP and perform consistently in order to maximize return to PLOF and decrease risk of re-injury.   Time 3   Period Weeks   Status New     PT SHORT TERM GOAL #2   Title Pt will have improved L rotation and L LF by 5 deg in order to maximize driving and decrease pain.    Time 3   Period Weeks   Status New     PT SHORT TERM GOAL #3   Title Pt will have improved BUE strength to 5/5 without neck pain in order to maximize ability to perform house and yard work.   Time 3   Period Weeks   Status New           PT Long Term Goals - 12/08/16 1425      PT LONG TERM GOAL #1   Title Pt will have improved cervical AROM throughout by 15 deg in order to maximize driving and decrease overall pain.   Time 6   Period Weeks   Status New     PT  LONG TERM GOAL #2   Title Pt will report being able to sleep through the night with minimal to no neck pain in order to maximize recovery and promote improved overall well-being.   Time 6   Period Weeks   Status New     PT LONG TERM GOAL #3   Title Pt will report being able to perform 1 hour of yard work with minimal to no neck pain in order to demonstrate improved overall function and maximize participation at home.    Time 6   Period Weeks   Status New                Plan - 12/08/16 1422    Clinical Impression Statement Pt is 61 YO M who presents to OPPT with c/o chronic neck pain with radicular symptoms, worsening over the last few months. His pain is along both sides of his neck, L>R, and he reports bil n/t that goes to his fingertips. Pt has mild deficits in BUE strength (L>R), significant deficits in ROM (especially L rotation and L LF), increased soft tissue restrictions of cervical paraspinals and periscapular musculature, as well as mild mobility deficits of cervical and upper thoracic spine. Pt has h/o C5-6 fusion (sometime in 2017, he can't remember), which he reports initially helped, but his pain has returned from insidious onset. He reports issues with gripping and dropping items, but his grip strength is WNL in BUE. Pt would benefit from skilled PT to maximize overall function and decrease pain.   History and Personal  Factors relevant to plan of care: h/o C5-6 fusion, chronic pain   Clinical Presentation Stable   Clinical Presentation due to: ROM, MMT, radicular symptoms, mobility, posture   Clinical Decision Making Low   Rehab Potential Fair   PT Frequency 2x / week   PT Duration 6 weeks   PT Treatment/Interventions ADLs/Self Care Home Management;Cryotherapy;Moist Heat;DME Instruction;Gait training;Stair training;Functional mobility training;Therapeutic activities;Therapeutic exercise;Balance training;Neuromuscular re-education;Patient/family education;Manual  techniques;Passive range of motion;Dry needling;Taping   PT Next Visit Plan Review goals and HEP, assess nerve glides (median, ulnar, radial), measure cervical AROM as Epic crashed during initial eval and did not save measurements; manual to cervical and periscapular musculature, cervical CPAs if with PT, cervical isometrics, posture education and strengthening, pec stretch and biceps stretch in doorway   PT Home Exercise Plan Eval: supine cervical retractions, seated cervical rotation with towel   Consulted and Agree with Plan of Care Patient      Patient will benefit from skilled therapeutic intervention in order to improve the following deficits and impairments:  Decreased range of motion, Decreased strength, Hypomobility, Increased fascial restricitons, Increased muscle spasms, Improper body mechanics, Postural dysfunction, Pain  Visit Diagnosis: Cervicalgia - Plan: PT plan of care cert/re-cert  Radiculopathy, cervical region - Plan: PT plan of care cert/re-cert  Other symptoms and signs involving the musculoskeletal system - Plan: PT plan of care cert/re-cert  Abnormal posture - Plan: PT plan of care cert/re-cert     Problem List Patient Active Problem List   Diagnosis Date Noted  . Depression, recurrent (HCC) 09/20/2016  . Insomnia 09/20/2016  . Osteoarthritis of spine with radiculopathy, lumbar region 07/02/2016  . Heme positive stool 04/14/2016  . Dysphagia 11/13/2015  . Loose stools 11/13/2015  . Other synovitis and tenosynovitis, unspecified lower leg   . Chronic pain syndrome 06/24/2014  . Abnormal fasting glucose 02/11/2014  . Fibromyalgia 09/29/2013  . Other malaise and fatigue 01/09/2013  . Chest pain 07/17/2012  . Essential hypertension 07/17/2012  . Arthritis 07/17/2012     Jac CanavanBrooke Powell PT, DPT   [facility] [address] [place], [place], [address] Phone: [phone]   Fax:  [phone]  Name: Eveline KetoRobert L  Ma MRN: [id number] Date of Birth: 1956/02/13

## 2016-12-08 NOTE — Patient Instructions (Signed)
  CERVICAL CHIN TUCK  AND RETRACTION - SUPINE WITH TOWEL  While lying on your back with a small folded up towel under your head, tuck your chin towards your chest. Also, focus on putting pressure on the towel with the back of your head.   Maintain contact of head with the towel the entire time.    CERVICAL TOWEL ROTATION STRETCH  Hold the ends of a small folded bath towel and wrap it around your head and neck as shown. Place the towel on your face so as to minimize placing pressure on your jaw. Pressure should be placed on the side of your face/cheek bone.   Use your bottom most arm to anchor the towel in place. Use your top most arm to pull the towel to cause a gentle rotational stretch in your neck. Hold, then return to starting position and repeat.

## 2016-12-10 ENCOUNTER — Ambulatory Visit (HOSPITAL_COMMUNITY): Payer: BLUE CROSS/BLUE SHIELD | Admitting: Physical Therapy

## 2016-12-10 DIAGNOSIS — M542 Cervicalgia: Secondary | ICD-10-CM | POA: Diagnosis not present

## 2016-12-10 DIAGNOSIS — R29898 Other symptoms and signs involving the musculoskeletal system: Secondary | ICD-10-CM | POA: Diagnosis not present

## 2016-12-10 DIAGNOSIS — M5412 Radiculopathy, cervical region: Secondary | ICD-10-CM | POA: Diagnosis not present

## 2016-12-10 DIAGNOSIS — R293 Abnormal posture: Secondary | ICD-10-CM | POA: Diagnosis not present

## 2016-12-10 NOTE — Therapy (Addendum)
[facility] [address] [place], [place], [address] Phone: [phone]   Fax:  [phone]  Physical Therapy Treatment  Patient Details  Name: Vernon Fisher MRN: [id number] Date of Birth: 03/31/1956 Referring Provider: Coletta Memos, MD  Encounter Date: 12/10/2016          PT End of Session - 12/14/16 0820    Visit Number 2   Number of Visits 13   Date for PT Re-Evaluation 01/19/17   Authorization Type [employer] other   Authorization Time Period 12/08/16 to 01/19/17   PT Start Time 0735   PT Stop Time 0815   PT Time Calculation (min) 40 min   Activity Tolerance Patient tolerated treatment well   Behavior During Therapy [facility] for tasks assessed/performed     Past Medical History:  Diagnosis Date  . Anxiety   . Chronic pain   . Degenerative disc disease   . Depression   . Diabetes mellitus without complication (HCC)    diet controlled  . Fibromyalgia   . GERD (gastroesophageal reflux disease)   . Heart murmur    told as a child none since  . Hypertension   . Migraine headache   . Plantar fasciitis   . PONV (postoperative nausea and vomiting)   . Reflux     Past Surgical History:  Procedure Laterality Date  . bilateral carpal tunnel release    . BIOPSY  12/23/2015   Procedure: BIOPSY;  Surgeon: [name], MD;  [facility];  Service: Endoscopy;;  duodenal bx gastric bx esophageal bx  . BIOPSY  05/11/2016   Procedure: BIOPSY;  Surgeon: [name], MD;  [facility];  Service: Endoscopy;;  . CERVICAL FUSION     plates and screws  . COLONOSCOPY  06/08/2011   Dr. [name]: normal. Repeat in 10 years  . COLONOSCOPY WITH PROPOFOL N/A 05/11/2016   Procedure: COLONOSCOPY WITH PROPOFOL;  Surgeon: [name], MD;  [facility];  Service: Endoscopy;  Laterality: N/A;  815  . ESOPHAGOGASTRODUODENOSCOPY (EGD) WITH PROPOFOL N/A 12/23/2015   Dr. [name]: moderate Schatzki's ring s/p  dilation, small hiatal hernia, benign small bowel biopsies, negative for EOE, mild gastritis  . KNEE ARTHROSCOPY WITH MEDIAL MENISECTOMY Left 09/06/2014   Procedure: KNEE ARTHROSCOPY WITH LIMITED DEBRIDEMENT;  Surgeon: Vickki Hearing, MD;  Location: [facility];  Service: Orthopedics;  Laterality: Left;  . NOSE SURGERY     repair of fracture and deviated septum from fracture  . Right Rotator Cuff repair    . SAVORY DILATION N/A 12/23/2015   Procedure: SAVORY DILATION;  Surgeon: [name], MD;  [facility];  Service: Endoscopy;  Laterality: N/A;    There were no vitals filed for this visit.      Subjective Assessment - 12/10/16 0737    Subjective Pt reports things are going well. He was able to try his exercises at home without too much difficulty.    Limitations House hold activities;Lifting   How long can you sit comfortably? no issues   How long can you stand comfortably? no issues   How long can you walk comfortably? no issues   Patient Stated Goals improve ROM, stiffness, strengthening   Currently in Pain? Yes   Pain Score 7    Pain Location Neck   Pain Orientation Left   Pain Descriptors / Indicators Aching   Pain Type Chronic pain   Pain Onset More than a  month ago   Pain Frequency Constant   Aggravating Factors  any movement   Pain Relieving Factors not sure                          [facility] Adult PT Treatment/Exercise - 12/10/16 0001      Exercises   Exercises Neck     Neck Exercises: Standing   Other Standing Exercises self trigger point release/massage with ball against wall for HEP demo     Neck Exercises: Seated   Neck Retraction 15 reps   Other Seated Exercise thoracic extension over chair T8 to T5 x5 reps each, HEP demo      Manual Therapy   Manual Therapy Joint mobilization;Soft tissue mobilization;Myofascial release   Manual therapy comments manual therapy completed separate other treatments   Joint Mobilization Grade 3 CPAs  x2 bouts along T3-T6; P   Myofascial Release sub occipital release, TrP release Lt upper trap (+)twitch response, Lt infraspinatus and teres minor with (+) twitch response and increase in pain/symptoms down the arm.                 PT Education - 12/10/16 0856    Education provided Yes   Education Details reviewed HEP; discussed typical referral patterns of trigger points in the infraspinatus and teres region and possible cause of his current symptoms based on response during the session; implications for manual techniques    Person(s) Educated Patient   Methods Explanation;Demonstration;Handout   Comprehension Verbalized understanding;Returned demonstration          PT Short Term Goals - 12/08/16 1425      PT SHORT TERM GOAL #1   Title Pt will be independent with HEP and perform consistently in order to maximize return to PLOF and decrease risk of re-injury.   Time 3   Period Weeks   Status New     PT SHORT TERM GOAL #2   Title Pt will have improved L rotation and L LF by 5 deg in order to maximize driving and decrease pain.    Time 3   Period Weeks   Status New     PT SHORT TERM GOAL #3   Title Pt will have improved BUE strength to 5/5 without neck pain in order to maximize ability to perform house and yard work.   Time 3   Period Weeks   Status New           PT Long Term Goals - 12/08/16 1425      PT LONG TERM GOAL #1   Title Pt will have improved cervical AROM throughout by 15 deg in order to maximize driving and decrease overall pain.   Time 6   Period Weeks   Status New     PT LONG TERM GOAL #2   Title Pt will report being able to sleep through the night with minimal to no neck pain in order to maximize recovery and promote improved overall well-being.   Time 6   Period Weeks   Status New     PT LONG TERM GOAL #3   Title Pt will report being able to perform 1 hour of yard work with minimal to no neck pain in order to demonstrate improved overall  function and maximize participation at home.    Time 6   Period Weeks   Status New               Plan -  12/10/16 0857    Clinical Impression Statement Pt arrived today reporting Lt cervical pain with UE symptoms. Therapist reviewed HEP with some adjustments made to improve technique. Pt reporting LUE symptoms onset without cause through the session, therapist noting trigger points noted along his Lt infraspinatus and teres minor region which reproduced him symptoms down the UE specifically. Manual techniques were completed to address joint and soft tissue restrictions throughout the cervical, thoracic and periscapular region. Added 2 exercises to pt's HEP and he was able to demonstrate good technique with review. Will continue with current POC.    Rehab Potential Fair   PT Frequency 2x / week   PT Duration 6 weeks   PT Treatment/Interventions ADLs/Self Care Home Management;Cryotherapy;Moist Heat;DME Instruction;Gait training;Stair training;Functional mobility training;Therapeutic activities;Therapeutic exercise;Balance training;Neuromuscular re-education;Patient/family education;Manual techniques;Passive range of motion;Dry needling;Taping   PT Next Visit Plan cervical AROM measurements; MFR Lt periscap region (infraspinatus/teres); thoracic mobs and mobility work; [employer] along upper trap/levator    PT Home Exercise Plan Eval: supine cervical retractions, seated cervical rotation with towel   Consulted and Agree with Plan of Care Patient      Patient will benefit from skilled therapeutic intervention in order to improve the following deficits and impairments:  Decreased range of motion, Decreased strength, Hypomobility, Increased fascial restricitons, Increased muscle spasms, Improper body mechanics, Postural dysfunction, Pain  Visit Diagnosis: Cervicalgia  Radiculopathy, cervical region  Other symptoms and signs involving the musculoskeletal system  Abnormal posture     Problem  List Patient Active Problem List   Diagnosis Date Noted  . Depression, recurrent (HCC) 09/20/2016  . Insomnia 09/20/2016  . Osteoarthritis of spine with radiculopathy, lumbar region 07/02/2016  . Heme positive stool 04/14/2016  . Dysphagia 11/13/2015  . Loose stools 11/13/2015  . Other synovitis and tenosynovitis, unspecified lower leg   . Chronic pain syndrome 06/24/2014  . Abnormal fasting glucose 02/11/2014  . Fibromyalgia 09/29/2013  . Other malaise and fatigue 01/09/2013  . Chest pain 07/17/2012  . Essential hypertension 07/17/2012  . Arthritis 07/17/2012   9:08 AM,12/10/16 Marylyn IshiharaSara Kiser PT, DPT [facility] [phone]   [facility] [address] [place], [place], [address] Phone: [phone]   Fax:  [phone]  Name: Vernon KetoRobert L Fisher MRN: [id number] Date of Birth: 1955-11-02   *addendum to include session visit #, time in and out.   3:54 PM,12/14/16 Marylyn IshiharaSara Kiser PT, DPT [facility] [phone]

## 2016-12-14 ENCOUNTER — Ambulatory Visit (HOSPITAL_COMMUNITY): Payer: BLUE CROSS/BLUE SHIELD

## 2016-12-14 ENCOUNTER — Encounter (HOSPITAL_COMMUNITY): Payer: Self-pay

## 2016-12-14 DIAGNOSIS — M5412 Radiculopathy, cervical region: Secondary | ICD-10-CM

## 2016-12-14 DIAGNOSIS — R29898 Other symptoms and signs involving the musculoskeletal system: Secondary | ICD-10-CM

## 2016-12-14 DIAGNOSIS — M542 Cervicalgia: Secondary | ICD-10-CM

## 2016-12-14 DIAGNOSIS — R293 Abnormal posture: Secondary | ICD-10-CM

## 2016-12-14 NOTE — Therapy (Signed)
[facility] [address] [place], [place], [address] Phone: [phone]   Fax:  [phone]  Physical Therapy Treatment  Patient Details  Name: Vernon Fisher MRN: [id number] Date of Birth: [birth year] Referring Provider: Coletta Memos, MD  Encounter Date: 12/14/2016      PT End of Session - 12/14/16 0820    Visit Number 3   Number of Visits 13   Date for PT Re-Evaluation 01/19/17   Authorization Type BC[employer] other   Authorization Time Period 12/08/16 to 01/19/17   PT Start Time 0817   PT Stop Time 0859   PT Time Calculation (min) 42 min   Activity Tolerance Patient tolerated treatment well   Behavior During Therapy [facility] for tasks assessed/performed      Past Medical History:  Diagnosis Date  . Anxiety   . Chronic pain   . Degenerative disc disease   . Depression   . Diabetes mellitus without complication (HCC)    diet controlled  . Fibromyalgia   . GERD (gastroesophageal reflux disease)   . Heart murmur    told as a child none since  . Hypertension   . Migraine headache   . Plantar fasciitis   . PONV (postoperative nausea and vomiting)   . Reflux     Past Surgical History:  Procedure Laterality Date  . bilateral carpal tunnel release    . BIOPSY  12/23/2015   Procedure: BIOPSY;  Surgeon: [name], MD;  [facility];  Service: Endoscopy;;  duodenal bx gastric bx esophageal bx  . BIOPSY  05/11/2016   Procedure: BIOPSY;  Surgeon: [name], MD;  [facility];  Service: Endoscopy;;  . CERVICAL FUSION     plates and screws  . COLONOSCOPY  06/08/2011   Dr. [name]: normal. Repeat in 10 years  . COLONOSCOPY WITH PROPOFOL N/A 05/11/2016   Procedure: COLONOSCOPY WITH PROPOFOL;  Surgeon: [name], MD;  [facility];  Service: Endoscopy;  Laterality: N/A;  815  . ESOPHAGOGASTRODUODENOSCOPY (EGD) WITH PROPOFOL N/A 12/23/2015   Dr. [name]: moderate Schatzki's ring s/p dilation, small  hiatal hernia, benign small bowel biopsies, negative for EOE, mild gastritis  . KNEE ARTHROSCOPY WITH MEDIAL MENISECTOMY Left 09/06/2014   Procedure: KNEE ARTHROSCOPY WITH LIMITED DEBRIDEMENT;  Surgeon: Vickki Hearing, MD;  Location: [facility];  Service: Orthopedics;  Laterality: Left;  . NOSE SURGERY     repair of fracture and deviated septum from fracture  . Right Rotator Cuff repair    . SAVORY DILATION N/A 12/23/2015   Procedure: SAVORY DILATION;  Surgeon: [name], MD;  [facility];  Service: Endoscopy;  Laterality: N/A;    There were no vitals filed for this visit.      Subjective Assessment - 12/14/16 0819    Subjective Pt states that he feels worse today and states that he thinks it's because he did too much. He states that he thinks he rode around too much yesterday.   Limitations House hold activities;Lifting   How long can you sit comfortably? no issues   How long can you stand comfortably? no issues   How long can you walk comfortably? no issues   Patient Stated Goals improve ROM, stiffness, strengthening   Currently in Pain? Yes   Pain Score 9    Pain Location Neck   Pain Orientation Left   Pain Descriptors / Indicators Aching   Pain Type Chronic pain  Pain Onset More than a month ago   Pain Frequency Constant   Aggravating Factors  any movement    Pain Relieving Factors not sure   Effect of Pain on Daily Activities increased            [facility] PT Assessment - 12/14/16 0001      ROM / Strength   AROM / PROM / Strength AROM     AROM   Cervical Flexion 37   Cervical Extension 29   Cervical - Right Side Bend 20   Cervical - Left Side Bend 11   Cervical - Right Rotation 48   Cervical - Left Rotation 19                     [facility] Adult PT Treatment/Exercise - 12/14/16 0001      Neck Exercises: Seated   Other Seated Exercise 3D thoracic excursions x 5 reps each (ext, bil rot, bil LF) with 5-10 sec holds at end range     Manual  Therapy   Manual Therapy Joint mobilization;Myofascial release   Manual therapy comments manual therapy completed separate other treatments   Joint Mobilization Grade 3 CPAs x2 bouts along T3-T8   Myofascial Release suboccipital release, TrP release to L middle trap/rhomboid with + twitch response, L infraspinatus and teres near insertion with mild + twitch response; all reproduced symptoms down the arm     Neck Exercises: Stretches   Other Neck Stretches L posterior capsule stretch 3 x 20 sec                PT Education - 12/14/16 0907    Education provided Yes   Education Details sleep with towel roll in pillow, continue HEP   Person(s) Educated Patient   Methods Explanation;Demonstration   Comprehension Verbalized understanding;Returned demonstration          PT Short Term Goals - 12/08/16 1425      PT SHORT TERM GOAL #1   Title Pt will be independent with HEP and perform consistently in order to maximize return to PLOF and decrease risk of re-injury.   Time 3   Period Weeks   Status New     PT SHORT TERM GOAL #2   Title Pt will have improved L rotation and L LF by 5 deg in order to maximize driving and decrease pain.    Time 3   Period Weeks   Status New     PT SHORT TERM GOAL #3   Title Pt will have improved BUE strength to 5/5 without neck pain in order to maximize ability to perform house and yard work.   Time 3   Period Weeks   Status New           PT Long Term Goals - 12/08/16 1425      PT LONG TERM GOAL #1   Title Pt will have improved cervical AROM throughout by 15 deg in order to maximize driving and decrease overall pain.   Time 6   Period Weeks   Status New     PT LONG TERM GOAL #2   Title Pt will report being able to sleep through the night with minimal to no neck pain in order to maximize recovery and promote improved overall well-being.   Time 6   Period Weeks   Status New     PT LONG TERM GOAL #3   Title Pt will report being able  to perform 1  hour of yard work with minimal to no neck pain in order to demonstrate improved overall function and maximize participation at home.    Time 6   Period Weeks   Status New               Plan - 12/14/16 0900    Clinical Impression Statement Pt presented to therapy with increased pain this date at 9/10. He reports that he was riding in the car a lot over the weekend and yesterday and he thinks that is what aggravated his pain. Began session with suboccipital release and manual to periscapular musculature. Therapist able to illicit + twitch reponse in pt's L rhomboid and he stated this was recreating his LUE symptoms. Pt with continued restrictions of infraspinatus and teres towards near insertion point and again, stated this was recreating his LUE symptoms. Educated pt to sleep with towel roll in pillow case since he stated he wakes up with BUE numbness. He verbalized understanding. Continue to address soft tissue restrictions while promoting improved mobility and posture.   Rehab Potential Fair   PT Frequency 2x / week   PT Duration 6 weeks   PT Treatment/Interventions ADLs/Self Care Home Management;Cryotherapy;Moist Heat;DME Instruction;Gait training;Stair training;Functional mobility training;Therapeutic activities;Therapeutic exercise;Balance training;Neuromuscular re-education;Patient/family education;Manual techniques;Passive range of motion;Dry needling;Taping   PT Next Visit Plan continue MFR Lt periscap region (infraspinatus/teres/rhomboid); thoracic mobs and mobility work; [employer] along upper trap/levator; educate on sitting posture with lumbar roll since he does a lot of driving; seated retractions, trial self cervical isometrics in supine    PT Home Exercise Plan Eval: supine cervical retractions, seated cervical rotation with towel; 6/12: sleep with towel roll in pillow   Consulted and Agree with Plan of Care Patient      Patient will benefit from skilled therapeutic  intervention in order to improve the following deficits and impairments:  Decreased range of motion, Decreased strength, Hypomobility, Increased fascial restricitons, Increased muscle spasms, Improper body mechanics, Postural dysfunction, Pain  Visit Diagnosis: Cervicalgia  Radiculopathy, cervical region  Other symptoms and signs involving the musculoskeletal system  Abnormal posture     Problem List Patient Active Problem List   Diagnosis Date Noted  . Depression, recurrent (HCC) 09/20/2016  . Insomnia 09/20/2016  . Osteoarthritis of spine with radiculopathy, lumbar region 07/02/2016  . Heme positive stool 04/14/2016  . Dysphagia 11/13/2015  . Loose stools 11/13/2015  . Other synovitis and tenosynovitis, unspecified lower leg   . Chronic pain syndrome 06/24/2014  . Abnormal fasting glucose 02/11/2014  . Fibromyalgia 09/29/2013  . Other malaise and fatigue 01/09/2013  . Chest pain 07/17/2012  . Essential hypertension 07/17/2012  . Arthritis 07/17/2012     Jac Canavan PT, DPT   [facility] [address] [place], [place], [address] Phone: [phone]   Fax:  [phone]  Name: Vernon Fisher MRN: [id number] Date of Birth: Jun 29, 1956

## 2016-12-17 ENCOUNTER — Ambulatory Visit (HOSPITAL_COMMUNITY): Payer: BLUE CROSS/BLUE SHIELD | Admitting: Physical Therapy

## 2016-12-17 DIAGNOSIS — R29898 Other symptoms and signs involving the musculoskeletal system: Secondary | ICD-10-CM

## 2016-12-17 DIAGNOSIS — R293 Abnormal posture: Secondary | ICD-10-CM

## 2016-12-17 DIAGNOSIS — M5412 Radiculopathy, cervical region: Secondary | ICD-10-CM | POA: Diagnosis not present

## 2016-12-17 DIAGNOSIS — M542 Cervicalgia: Secondary | ICD-10-CM

## 2016-12-17 NOTE — Patient Instructions (Signed)

## 2016-12-17 NOTE — Therapy (Signed)
[facility] [address] [place], [place], [address] Phone: [phone]   Fax:  [phone]  Physical Therapy Treatment  Patient Details  Name: Vernon Fisher MRN: [id number] Date of Birth: 12-19-1955 Referring Provider: Coletta Memos, MD  Encounter Date: 12/17/2016      PT End of Session - 12/17/16 0815    Visit Number 4   Number of Visits 13   Date for PT Re-Evaluation 01/19/17   Authorization Type [employer]BS other   Authorization Time Period 12/08/16 to 01/19/17   PT Start Time 0740  Pt arrived late    PT Stop Time 0814   PT Time Calculation (min) 34 min   Activity Tolerance Patient tolerated treatment well;No increased pain   Behavior During Therapy WFL for tasks assessed/performed      Past Medical History:  Diagnosis Date  . Anxiety   . Chronic pain   . Degenerative disc disease   . Depression   . Diabetes mellitus without complication (HCC)    diet controlled  . Fibromyalgia   . GERD (gastroesophageal reflux disease)   . Heart murmur    told as a child none since  . Hypertension   . Migraine headache   . Plantar fasciitis   . PONV (postoperative nausea and vomiting)   . Reflux     Past Surgical History:  Procedure Laterality Date  . bilateral carpal tunnel release    . BIOPSY  12/23/2015   Procedure: BIOPSY;  Surgeon: [name], MD;  [facility];  Service: Endoscopy;;  duodenal bx gastric bx esophageal bx  . BIOPSY  05/11/2016   Procedure: BIOPSY;  Surgeon: [name], MD;  [facility];  Service: Endoscopy;;  . CERVICAL FUSION     plates and screws  . COLONOSCOPY  06/08/2011   Dr. [name]: normal. Repeat in 10 years  . COLONOSCOPY WITH PROPOFOL N/A 05/11/2016   Procedure: COLONOSCOPY WITH PROPOFOL;  Surgeon: [name], MD;  [facility];  Service: Endoscopy;  Laterality: N/A;  815  . ESOPHAGOGASTRODUODENOSCOPY (EGD) WITH PROPOFOL N/A 12/23/2015   Dr. [name]: moderate  Schatzki's ring s/p dilation, small hiatal hernia, benign small bowel biopsies, negative for EOE, mild gastritis  . KNEE ARTHROSCOPY WITH MEDIAL MENISECTOMY Left 09/06/2014   Procedure: KNEE ARTHROSCOPY WITH LIMITED DEBRIDEMENT;  Surgeon: Vickki Hearing, MD;  [facility]: [facility];  Service: Orthopedics;  Laterality: Left;  . NOSE SURGERY     repair of fracture and deviated septum from fracture  . Right Rotator Cuff repair    . SAVORY DILATION N/A 12/23/2015   Procedure: SAVORY DILATION;  Surgeon: [name], MD;  [facility];  Service: Endoscopy;  Laterality: N/A;    There were no vitals filed for this visit.      Subjective Assessment - 12/17/16 0741    Subjective Pt reports that he will work on his HEP at home and thinks he may be doing too much. He reports things are ok today.    Limitations House hold activities;Lifting   How long can you sit comfortably? no issues   How long can you stand comfortably? no issues   How long can you walk comfortably? no issues   Patient Stated Goals improve ROM, stiffness, strengthening   Currently in Pain? Other (Comment)  Pt reports his pain currently is better compared to his last session.    Pain Onset More than a month ago  [facility] Adult PT Treatment/Exercise - 12/17/16 0001      Self-Care   Self-Care Posture   Posture set up and use of lumbar support while sitting      Neck Exercises: Seated   Neck Retraction 20 reps   Neck Retraction Limitations reviewed technique   Other Seated Exercise thoracic rotation x10 reps each direction followed by therapist over pressure      Neck Exercises: Prone   Other Prone Exercise quadruped thoracic rotation x4 reps, limited Lt and Rt      Manual Therapy   Manual therapy comments manual therapy completed separate other treatments   Joint Mobilization Grade 3/4 CPAs x2 bouts along T3-T8   Myofascial Release TrP release to L middle trap/rhomboid with  + twitch response, L infraspinatus and teres near insertion with + twitch response; all reproduced symptoms down the arm and up into the neck                PT Education - 12/17/16 0815    Education provided Yes   Education Details set up and importance of lumbar roll in the car/etc.; Updated HEP   Person(s) Educated Patient   Methods Explanation;Verbal cues;Tactile cues;Handout   Comprehension Verbalized understanding;Returned demonstration          PT Short Term Goals - 12/08/16 1425      PT SHORT TERM GOAL #1   Title Pt will be independent with HEP and perform consistently in order to maximize return to PLOF and decrease risk of re-injury.   Time 3   Period Weeks   Status New     PT SHORT TERM GOAL #2   Title Pt will have improved L rotation and L LF by 5 deg in order to maximize driving and decrease pain.    Time 3   Period Weeks   Status New     PT SHORT TERM GOAL #3   Title Pt will have improved BUE strength to 5/5 without neck pain in order to maximize ability to perform house and yard work.   Time 3   Period Weeks   Status New           PT Long Term Goals - 12/08/16 1425      PT LONG TERM GOAL #1   Title Pt will have improved cervical AROM throughout by 15 deg in order to maximize driving and decrease overall pain.   Time 6   Period Weeks   Status New     PT LONG TERM GOAL #2   Title Pt will report being able to sleep through the night with minimal to no neck pain in order to maximize recovery and promote improved overall well-being.   Time 6   Period Weeks   Status New     PT LONG TERM GOAL #3   Title Pt will report being able to perform 1 hour of yard work with minimal to no neck pain in order to demonstrate improved overall function and maximize participation at home.    Time 6   Period Weeks   Status New               Plan - 12/17/16 4098    Clinical Impression Statement Pt continues to report Lt neck and UE symptoms despite  reported HEP adherence at home. His symptoms were reportedly improved from his last session however. Session continued focus on manual techniques to address soft tissue and joint restrictions. Also reviewed set up and use  of lumbar with pt verbalizing understanding. Noted local twitch response with trigger point release techniques performed and pt reported no increase in symptoms by the end of the session. Will continue with current POC.    Rehab Potential Fair   PT Frequency 2x / week   PT Duration 6 weeks   PT Treatment/Interventions ADLs/Self Care Home Management;Cryotherapy;Moist Heat;DME Instruction;Gait training;Stair training;Functional mobility training;Therapeutic activities;Therapeutic exercise;Balance training;Neuromuscular re-education;Patient/family education;Manual techniques;Passive range of motion;Dry needling;Taping   PT Next Visit Plan continue MFR Lt periscap region (infraspinatus/teres/rhomboid); thoracic mobs and mobility work; [employer] along upper trap/levator; educate on sitting posture with lumbar roll since he does a lot of driving; seated retractions, trial self cervical isometrics in supine    PT Home Exercise Plan Eval: supine cervical retractions, seated cervical rotation with towel; 6/12: sleep with towel roll in pillow   Consulted and Agree with Plan of Care Patient      Patient will benefit from skilled therapeutic intervention in order to improve the following deficits and impairments:  Decreased range of motion, Decreased strength, Hypomobility, Increased fascial restricitons, Increased muscle spasms, Improper body mechanics, Postural dysfunction, Pain  Visit Diagnosis: Cervicalgia  Radiculopathy, cervical region  Other symptoms and signs involving the musculoskeletal system  Abnormal posture     Problem List Patient Active Problem List   Diagnosis Date Noted  . Depression, recurrent (HCC) 09/20/2016  . Insomnia 09/20/2016  . Osteoarthritis of spine with  radiculopathy, lumbar region 07/02/2016  . Heme positive stool 04/14/2016  . Dysphagia 11/13/2015  . Loose stools 11/13/2015  . Other synovitis and tenosynovitis, unspecified lower leg   . Chronic pain syndrome 06/24/2014  . Abnormal fasting glucose 02/11/2014  . Fibromyalgia 09/29/2013  . Other malaise and fatigue 01/09/2013  . Chest pain 07/17/2012  . Essential hypertension 07/17/2012  . Arthritis 07/17/2012    9:28 AM,12/17/16 Marylyn IshiharaSara Kiser PT, DPT [facility] [phone]   [facility] [address] [place], [place], [address] Phone: [phone]   Fax:  [phone]  Name: Vernon Fisher MRN: [id number] Date of Birth: 1956/03/10

## 2016-12-20 ENCOUNTER — Ambulatory Visit (HOSPITAL_COMMUNITY): Payer: BLUE CROSS/BLUE SHIELD | Admitting: Physical Therapy

## 2016-12-20 ENCOUNTER — Telehealth (HOSPITAL_COMMUNITY): Payer: Self-pay | Admitting: Family Medicine

## 2016-12-20 NOTE — Telephone Encounter (Signed)
12/20/16 Cindy mentioned he was on hold on her scehdule for 10:30.  I looked back at appt notes and saw where a note said to change the 8:15 time to 10:30 and was on hold on Cindy's schedule but I saw no note that this was confirmed with the aptient.  I called and left him a message to see if he could come in and knew about this appt time change.

## 2016-12-22 ENCOUNTER — Ambulatory Visit (HOSPITAL_COMMUNITY): Payer: BLUE CROSS/BLUE SHIELD | Admitting: Physical Therapy

## 2016-12-22 DIAGNOSIS — R29898 Other symptoms and signs involving the musculoskeletal system: Secondary | ICD-10-CM

## 2016-12-22 DIAGNOSIS — M542 Cervicalgia: Secondary | ICD-10-CM

## 2016-12-22 DIAGNOSIS — M5412 Radiculopathy, cervical region: Secondary | ICD-10-CM | POA: Diagnosis not present

## 2016-12-22 DIAGNOSIS — R293 Abnormal posture: Secondary | ICD-10-CM | POA: Diagnosis not present

## 2016-12-22 NOTE — Therapy (Signed)
[facility] [address] [place], [place], [address] Phone: [phone]   Fax:  [phone]  Physical Therapy Treatment  Patient Details  Name: Vernon KetoRobert L Fisher MRN: [id number] Date of Birth: 15-Jul-1955 Referring Provider: Coletta MemosKyle Cabbell, MD  Encounter Date: 12/22/2016      PT End of Session - 12/22/16 0835    Visit Number 5   Number of Visits 13   Date for PT Re-Evaluation 01/19/17   Authorization Type BCBS other   Authorization Time Period 12/08/16 to 01/19/17   PT Start Time 0817   PT Stop Time 0901   PT Time Calculation (min) 44 min   Activity Tolerance Patient tolerated treatment well;No increased pain   Behavior During Therapy WFL for tasks assessed/performed      Past Medical History:  Diagnosis Date  . Anxiety   . Chronic pain   . Degenerative disc disease   . Depression   . Diabetes mellitus without complication (HCC)    diet controlled  . Fibromyalgia   . GERD (gastroesophageal reflux disease)   . Heart murmur    told as a child none since  . Hypertension   . Migraine headache   . Plantar fasciitis   . PONV (postoperative nausea and vomiting)   . Reflux     Past Surgical History:  Procedure Laterality Date  . bilateral carpal tunnel release    . BIOPSY  12/23/2015   Procedure: BIOPSY;  Surgeon: West BaliSandi L Fields, MD;  [facility];  Service: Endoscopy;;  duodenal bx gastric bx esophageal bx  . BIOPSY  05/11/2016   Procedure: BIOPSY;  Surgeon: West BaliSandi L Fields, MD;  [facility];  Service: Endoscopy;;  . CERVICAL FUSION     plates and screws  . COLONOSCOPY  06/08/2011   Dr. Darrick PennaFields: normal. Repeat in 10 years  . COLONOSCOPY WITH PROPOFOL N/A 05/11/2016   Procedure: COLONOSCOPY WITH PROPOFOL;  Surgeon: West BaliSandi L Fields, MD;  [facility];  Service: Endoscopy;  Laterality: N/A;  815  . ESOPHAGOGASTRODUODENOSCOPY (EGD) WITH PROPOFOL N/A 12/23/2015   Dr. Darrick PennaFields: moderate Schatzki's ring s/p  dilation, small hiatal hernia, benign small bowel biopsies, negative for EOE, mild gastritis  . KNEE ARTHROSCOPY WITH MEDIAL MENISECTOMY Left 09/06/2014   Procedure: KNEE ARTHROSCOPY WITH LIMITED DEBRIDEMENT;  Surgeon: Vickki HearingStanley E Harrison, MD;  Location: [facility];  Service: Orthopedics;  Laterality: Left;  . NOSE SURGERY     repair of fracture and deviated septum from fracture  . Right Rotator Cuff repair    . SAVORY DILATION N/A 12/23/2015   Procedure: SAVORY DILATION;  Surgeon: West BaliSandi L Fields, MD;  [facility];  Service: Endoscopy;  Laterality: N/A;    There were no vitals filed for this visit.      Subjective Assessment - 12/22/16 0821    Subjective Pt reports that things are going well. He is likely going to the beach next week and will be gone for several weeks. No other complaints currently.     Limitations House hold activities;Lifting   How long can you sit comfortably? no issues   How long can you stand comfortably? no issues   How long can you walk comfortably? no issues   Patient Stated Goals improve ROM, stiffness, strengthening   Currently in Pain? Other (Comment)  no pain reported specifically   Pain Onset More than a month ago  [facility] Adult PT Treatment/Exercise - 12/22/16 0001      Neck Exercises: Seated   Other Seated Exercise thoracic extension mobilization over chair, x10 reps each segment from T8 to    Other Seated Exercise thoracic rotation x5 reps Lt and Rt      Neck Exercises: Supine   Other Supine Exercise serratus punch into horizontal abduction x10 reps, with green TB      Manual Therapy   Manual Therapy Muscle Energy Technique   Manual therapy comments manual therapy completed separate other treatments   Joint Mobilization Thoracic extension mobilization with movement from T6 to T3   Myofascial Release TrP release Lt infraspinatus, soft tissue mobility with Lt shoulder elevation 2x5 reps    Muscle Energy  Technique pec major contract/relax with stretch over pressure x6 reps      Neck Exercises: Stretches   Chest Stretch 3 reps;30 seconds                PT Education - 12/22/16 0834    Education provided Yes   Education Details importance of full HEP adherenc; technique with therex   Person(s) Educated Patient   Methods Explanation;Verbal cues;Tactile cues   Comprehension Verbalized understanding;Returned demonstration          PT Short Term Goals - 12/08/16 1425      PT SHORT TERM GOAL #1   Title Pt will be independent with HEP and perform consistently in order to maximize return to PLOF and decrease risk of re-injury.   Time 3   Period Weeks   Status New     PT SHORT TERM GOAL #2   Title Pt will have improved L rotation and L LF by 5 deg in order to maximize driving and decrease pain.    Time 3   Period Weeks   Status New     PT SHORT TERM GOAL #3   Title Pt will have improved BUE strength to 5/5 without neck pain in order to maximize ability to perform house and yard work.   Time 3   Period Weeks   Status New           PT Long Term Goals - 12/08/16 1425      PT LONG TERM GOAL #1   Title Pt will have improved cervical AROM throughout by 15 deg in order to maximize driving and decrease overall pain.   Time 6   Period Weeks   Status New     PT LONG TERM GOAL #2   Title Pt will report being able to sleep through the night with minimal to no neck pain in order to maximize recovery and promote improved overall well-being.   Time 6   Period Weeks   Status New     PT LONG TERM GOAL #3   Title Pt will report being able to perform 1 hour of yard work with minimal to no neck pain in order to demonstrate improved overall function and maximize participation at home.    Time 6   Period Weeks   Status New               Plan - 12/22/16 4098    Clinical Impression Statement Continued this session with manual techniques and therex to improve postural  strength and flexibility. Pt required cues to address technique with several new exercises, and therapist discussed with the pt several benefits of adhering to his full HEP. Continue to reproduce pt's symptoms with palpation of Lt  infraspinatus/teres region, and performed manual techniques to address this. Pt reporting no increase in symptoms following the session.    Rehab Potential Fair   PT Frequency 2x / week   PT Duration 6 weeks   PT Treatment/Interventions ADLs/Self Care Home Management;Cryotherapy;Moist Heat;DME Instruction;Gait training;Stair training;Functional mobility training;Therapeutic activities;Therapeutic exercise;Balance training;Neuromuscular re-education;Patient/family education;Manual techniques;Passive range of motion;Dry needling;Taping   PT Next Visit Plan cervical isometrics; continue MFR Lt periscap region (infraspinatus/teres/rhomboid); thoracic mobs and mobility work; [employer] along upper trap/levator; educate on sitting posture with lumbar roll since he does a lot of driving; seated retractions   PT Home Exercise Plan Eval: supine cervical retractions, seated cervical rotation with towel; 6/12: sleep with towel roll in pillow; 12/22/16 pec stretch   Consulted and Agree with Plan of Care Patient      Patient will benefit from skilled therapeutic intervention in order to improve the following deficits and impairments:  Decreased range of motion, Decreased strength, Hypomobility, Increased fascial restricitons, Increased muscle spasms, Improper body mechanics, Postural dysfunction, Pain  Visit Diagnosis: Cervicalgia  Radiculopathy, cervical region  Other symptoms and signs involving the musculoskeletal system  Abnormal posture     Problem List Patient Active Problem List   Diagnosis Date Noted  . Depression, recurrent (HCC) 09/20/2016  . Insomnia 09/20/2016  . Osteoarthritis of spine with radiculopathy, lumbar region 07/02/2016  . Heme positive stool 04/14/2016  .  Dysphagia 11/13/2015  . Loose stools 11/13/2015  . Other synovitis and tenosynovitis, unspecified lower leg   . Chronic pain syndrome 06/24/2014  . Abnormal fasting glucose 02/11/2014  . Fibromyalgia 09/29/2013  . Other malaise and fatigue 01/09/2013  . Chest pain 07/17/2012  . Essential hypertension 07/17/2012  . Arthritis 07/17/2012    9:16 AM,12/22/16 Marylyn Ishihara PT, DPT [facility] [phone]  [facility] [address] [place], [place], [address] Phone: [phone]   Fax:  [phone]  Name: Vernon Fisher MRN: [id number] Date of Birth: [birth year]

## 2016-12-23 ENCOUNTER — Ambulatory Visit (HOSPITAL_COMMUNITY): Payer: BLUE CROSS/BLUE SHIELD

## 2016-12-23 DIAGNOSIS — M542 Cervicalgia: Secondary | ICD-10-CM

## 2016-12-23 DIAGNOSIS — R29898 Other symptoms and signs involving the musculoskeletal system: Secondary | ICD-10-CM | POA: Diagnosis not present

## 2016-12-23 DIAGNOSIS — R293 Abnormal posture: Secondary | ICD-10-CM

## 2016-12-23 DIAGNOSIS — M5412 Radiculopathy, cervical region: Secondary | ICD-10-CM

## 2016-12-23 NOTE — Therapy (Signed)
[facility] [address] [place], [place], [address] Phone: [phone]   Fax:  [phone]  Physical Therapy Treatment  Patient Details  Name: Vernon KetoRobert L Fisher MRN: [id number] Date of Birth: [birth year] Referring Provider: Coletta MemosKyle Cabbell, MD  Encounter Date: 12/23/2016      PT End of Session - 12/23/16 0825    Visit Number 6   Number of Visits 13   Date for PT Re-Evaluation 01/19/17   Authorization Type BCBS other   Authorization Time Period 12/08/16 to 01/19/17   PT Start Time 0821   PT Stop Time 0901   PT Time Calculation (min) 40 min   Activity Tolerance Patient tolerated treatment well;No increased pain   Behavior During Therapy WFL for tasks assessed/performed      Past Medical History:  Diagnosis Date  . Anxiety   . Chronic pain   . Degenerative disc disease   . Depression   . Diabetes mellitus without complication (HCC)    diet controlled  . Fibromyalgia   . GERD (gastroesophageal reflux disease)   . Heart murmur    told as a child none since  . Hypertension   . Migraine headache   . Plantar fasciitis   . PONV (postoperative nausea and vomiting)   . Reflux     Past Surgical History:  Procedure Laterality Date  . bilateral carpal tunnel release    . BIOPSY  12/23/2015   Procedure: BIOPSY;  Surgeon: West BaliSandi L Fields, MD;  [facility];  Service: Endoscopy;;  duodenal bx gastric bx esophageal bx  . BIOPSY  05/11/2016   Procedure: BIOPSY;  Surgeon: West BaliSandi L Fields, MD;  [facility];  Service: Endoscopy;;  . CERVICAL FUSION     plates and screws  . COLONOSCOPY  06/08/2011   Dr. Darrick PennaFields: normal. Repeat in 10 years  . COLONOSCOPY WITH PROPOFOL N/A 05/11/2016   Procedure: COLONOSCOPY WITH PROPOFOL;  Surgeon: West BaliSandi L Fields, MD;  [facility];  Service: Endoscopy;  Laterality: N/A;  815  . ESOPHAGOGASTRODUODENOSCOPY (EGD) WITH PROPOFOL N/A 12/23/2015   Dr. Darrick PennaFields: moderate Schatzki's ring s/p  dilation, small hiatal hernia, benign small bowel biopsies, negative for EOE, mild gastritis  . KNEE ARTHROSCOPY WITH MEDIAL MENISECTOMY Left 09/06/2014   Procedure: KNEE ARTHROSCOPY WITH LIMITED DEBRIDEMENT;  Surgeon: Vickki HearingStanley E Harrison, MD;  [facility]: [facility];  Service: Orthopedics;  Laterality: Left;  . NOSE SURGERY     repair of fracture and deviated septum from fracture  . Right Rotator Cuff repair    . SAVORY DILATION N/A 12/23/2015   Procedure: SAVORY DILATION;  Surgeon: West BaliSandi L Fields, MD;  [facility];  Service: Endoscopy;  Laterality: N/A;    There were no vitals filed for this visit.      Subjective Assessment - 12/23/16 0823    Subjective Pt stated he has increased pain following laying flooring in kitchen with increased neck and shoulder pain scale 9/10   Patient Stated Goals improve ROM, stiffness, strengthening   Currently in Pain? Yes   Pain Score 9    Pain Location Neck   Pain Orientation Left   Pain Descriptors / Indicators Sore;Sharp   Pain Type Chronic pain   Pain Radiating Towards tingling BUE to fingertips    Pain Onset More than a month ago   Pain Frequency Constant   Aggravating Factors  any movement   Pain Relieving Factors not sure   Effect of Pain on Daily  Activities  increased                          [facility] Adult PT Treatment/Exercise - 12/23/16 0001      Neck Exercises: Seated   Neck Retraction 20 reps   Neck Retraction Limitations reviewed technique   Other Seated Exercise 3D thoracic excursion 5x    Other Seated Exercise lumbar support education wiht towel; 20 rows RTB     Manual Therapy   Manual Therapy Soft tissue mobilization   Manual therapy comments manual therapy completed separate other treatments   Soft tissue mobilization upper traps, levator scapula, pec major    Myofascial Release TrP release to Lt pec major     Neck Exercises: Stretches   Corner Stretch 3 reps;30 seconds                PT  Education - 12/22/16 0834    Education provided Yes   Education Details importance of full HEP adherenc; technique with therex   Person(s) Educated Patient   Methods Explanation;Verbal cues;Tactile cues   Comprehension Verbalized understanding;Returned demonstration          PT Short Term Goals - 12/08/16 1425      PT SHORT TERM GOAL #1   Title Pt will be independent with HEP and perform consistently in order to maximize return to PLOF and decrease risk of re-injury.   Time 3   Period Weeks   Status New     PT SHORT TERM GOAL #2   Title Pt will have improved L rotation and L LF by 5 deg in order to maximize driving and decrease pain.    Time 3   Period Weeks   Status New     PT SHORT TERM GOAL #3   Title Pt will have improved BUE strength to 5/5 without neck pain in order to maximize ability to perform house and yard work.   Time 3   Period Weeks   Status New           PT Long Term Goals - 12/08/16 1425      PT LONG TERM GOAL #1   Title Pt will have improved cervical AROM throughout by 15 deg in order to maximize driving and decrease overall pain.   Time 6   Period Weeks   Status New     PT LONG TERM GOAL #2   Title Pt will report being able to sleep through the night with minimal to no neck pain in order to maximize recovery and promote improved overall well-being.   Time 6   Period Weeks   Status New     PT LONG TERM GOAL #3   Title Pt will report being able to perform 1 hour of yard work with minimal to no neck pain in order to demonstrate improved overall function and maximize participation at home.    Time 6   Period Weeks   Status New               Plan - 12/23/16 1159    Clinical Impression Statement Pt limited by pain this session following reports of laying floor in kitchen yesterday.  Pt presents with upper trap tightness, foward rolled shoulder and rounded back at entrance.  Pt educated importance of proper posture to address tight  musculature.  Added towel roll in lumbar position for support to improve seated posture, encouraged pt to add at home.  Continued  session focus with manual technqiues and therex to improve postural strengthening and flexibility.  STM to BIl upper traps, levator scapular, posterior cervical muscualture and trigger point release complete to Lt pec major to reduce tightness.  EOS pt reports pain reduced to 5/10.     Rehab Potential Fair   PT Frequency 2x / week   PT Duration 6 weeks   PT Treatment/Interventions ADLs/Self Care Home Management;Cryotherapy;Moist Heat;DME Instruction;Gait training;Stair training;Functional mobility training;Therapeutic activities;Therapeutic exercise;Balance training;Neuromuscular re-education;Patient/family education;Manual techniques;Passive range of motion;Dry needling;Taping   PT Next Visit Plan cervical isometrics; continue MFR Lt periscap region (infraspinatus/teres/rhomboid); thoracic mobs and mobility work; [employer] along upper trap/levator; f/u educate on sitting posture with lumbar roll since he does a lot of driving; seated retractions   PT Home Exercise Plan Eval: supine cervical retractions, seated cervical rotation with towel; 6/12: sleep with towel roll in pillow; 12/22/16 pec stretch      Patient will benefit from skilled therapeutic intervention in order to improve the following deficits and impairments:  Decreased range of motion, Decreased strength, Hypomobility, Increased fascial restricitons, Increased muscle spasms, Improper body mechanics, Postural dysfunction, Pain  Visit Diagnosis: Cervicalgia  Radiculopathy, cervical region  Other symptoms and signs involving the musculoskeletal system  Abnormal posture     Problem List Patient Active Problem List   Diagnosis Date Noted  . Depression, recurrent (HCC) 09/20/2016  . Insomnia 09/20/2016  . Osteoarthritis of spine with radiculopathy, lumbar region 07/02/2016  . Heme positive stool 04/14/2016   . Dysphagia 11/13/2015  . Loose stools 11/13/2015  . Other synovitis and tenosynovitis, unspecified lower leg   . Chronic pain syndrome 06/24/2014  . Abnormal fasting glucose 02/11/2014  . Fibromyalgia 09/29/2013  . Other malaise and fatigue 01/09/2013  . Chest pain 07/17/2012  . Essential hypertension 07/17/2012  . Arthritis 07/17/2012   Becky Sax, LPTA; CBIS [phone]  Juel Burrow 12/23/2016, 12:12 PM  [facility] [address] [place], [place], [address] Phone: [phone]   Fax:  [phone]  Name: Vernon Fisher MRN: [id number] Date of Birth: 1956/02/17

## 2017-01-10 ENCOUNTER — Telehealth (HOSPITAL_COMMUNITY): Payer: Self-pay | Admitting: Family Medicine

## 2017-01-10 ENCOUNTER — Ambulatory Visit (HOSPITAL_COMMUNITY): Payer: BLUE CROSS/BLUE SHIELD

## 2017-01-10 NOTE — Telephone Encounter (Signed)
01/10/17  pt cancelled via [employer] - I did call and left a messge to just confirm that he was cx today's appt. and confirmed the next appt.

## 2017-01-13 ENCOUNTER — Telehealth (HOSPITAL_COMMUNITY): Payer: Self-pay

## 2017-01-13 ENCOUNTER — Ambulatory Visit (HOSPITAL_COMMUNITY): Payer: BLUE CROSS/BLUE SHIELD | Attending: Neurosurgery

## 2017-01-13 DIAGNOSIS — M5412 Radiculopathy, cervical region: Secondary | ICD-10-CM | POA: Insufficient documentation

## 2017-01-13 DIAGNOSIS — M542 Cervicalgia: Secondary | ICD-10-CM | POA: Insufficient documentation

## 2017-01-13 DIAGNOSIS — R293 Abnormal posture: Secondary | ICD-10-CM | POA: Insufficient documentation

## 2017-01-13 DIAGNOSIS — R29898 Other symptoms and signs involving the musculoskeletal system: Secondary | ICD-10-CM | POA: Insufficient documentation

## 2017-01-13 NOTE — Telephone Encounter (Signed)
No show #1; Left voicemail regarding missed appointment. Reminded pt of next scheduled appointment and to call if he can't make it.   Jac CanavanBrooke Powell PT, DPT

## 2017-01-17 ENCOUNTER — Ambulatory Visit (HOSPITAL_COMMUNITY): Payer: BLUE CROSS/BLUE SHIELD | Admitting: Physical Therapy

## 2017-01-20 ENCOUNTER — Ambulatory Visit (HOSPITAL_COMMUNITY): Payer: BLUE CROSS/BLUE SHIELD | Admitting: Physical Therapy

## 2017-01-20 DIAGNOSIS — M542 Cervicalgia: Secondary | ICD-10-CM | POA: Diagnosis not present

## 2017-01-20 DIAGNOSIS — R293 Abnormal posture: Secondary | ICD-10-CM

## 2017-01-20 DIAGNOSIS — R29898 Other symptoms and signs involving the musculoskeletal system: Secondary | ICD-10-CM

## 2017-01-20 DIAGNOSIS — M5412 Radiculopathy, cervical region: Secondary | ICD-10-CM

## 2017-01-20 NOTE — Therapy (Signed)
[place], [place], [address] Phone: [phone]   Fax:  [phone]  Physical Therapy Treatment  Patient Details  Name: Vernon Fisher MRN: [id number] Date of Birth: [birth year] Referring Provider: Ashok Pall, MD   Encounter Date: 01/20/2017      PT End of Session - 01/20/17 0950    Visit Number 7   Number of Visits 13   Date for PT Re-Evaluation 01/19/17   Authorization Type [employer] other   Authorization Time Period 12/08/16 to 01/19/17   PT Start Time 0823  Pt arrived late    PT Stop Time 0901   PT Time Calculation (min) 38 min   Activity Tolerance Patient tolerated treatment well;No increased pain   Behavior During Therapy WFL for tasks assessed/performed      Past Medical History:  Diagnosis Date  . Anxiety   . Chronic pain   . Degenerative disc disease   . Depression   . Diabetes mellitus without complication (HCC)    diet controlled  . Fibromyalgia   . GERD (gastroesophageal reflux disease)   . Heart murmur    told as a child none since  . Hypertension   . Migraine headache   . Plantar fasciitis   . PONV (postoperative nausea and vomiting)   . Reflux     Past Surgical History:  Procedure Laterality Date  . bilateral carpal tunnel release    . BIOPSY  12/23/2015   Procedure: BIOPSY;  Surgeon: Danie Binder, MD;  [facility];  Service: Endoscopy;;  duodenal bx gastric bx esophageal bx  . BIOPSY  05/11/2016   Procedure: BIOPSY;  Surgeon: Danie Binder, MD;  [facility];  Service: Endoscopy;;  . CERVICAL FUSION     plates and screws  . COLONOSCOPY  06/08/2011   Dr. Oneida Alar: normal. Repeat in 10 years  . COLONOSCOPY WITH PROPOFOL N/A 05/11/2016   Procedure: COLONOSCOPY WITH PROPOFOL;  Surgeon: Danie Binder, MD;  [facility];  Service: Endoscopy;  Laterality: N/A;  815  . ESOPHAGOGASTRODUODENOSCOPY (EGD) WITH PROPOFOL N/A 12/23/2015   Dr. Oneida Alar: moderate  Schatzki's ring s/p dilation, small hiatal hernia, benign small bowel biopsies, negative for EOE, mild gastritis  . KNEE ARTHROSCOPY WITH MEDIAL MENISECTOMY Left 09/06/2014   Procedure: KNEE ARTHROSCOPY WITH LIMITED DEBRIDEMENT;  Surgeon: Carole Civil, MD;  Location: [facility];  Service: Orthopedics;  Laterality: Left;  . NOSE SURGERY     repair of fracture and deviated septum from fracture  . Right Rotator Cuff repair    . SAVORY DILATION N/A 12/23/2015   Procedure: SAVORY DILATION;  Surgeon: Danie Binder, MD;  [facility];  Service: Endoscopy;  Laterality: N/A;    There were no vitals filed for this visit.      Subjective Assessment - 01/20/17 0825    Subjective Pt states that his neck pain has improve some since he began therapy. He is still sore and stiff. He reports that he has only improved 10% overall. He states that he is probably doing his exercises every day. He continues to have muscle spasms in his mid-back.    How long can you sit comfortably? unlimited    How long can you stand comfortably? unlimited    How long can you walk comfortably? unlimited    Patient Stated Goals improve ROM, stiffness, strengthening   Currently in Pain? Yes   Pain Score 9   Pt  tried to rate a 10, but states his pain can be worse than it is currently.    Pain Location Neck   Pain Orientation Left   Pain Descriptors / Indicators Sore;Sharp   Pain Type Chronic pain   Pain Radiating Towards none currently    Pain Onset More than a month ago   Pain Frequency Constant   Aggravating Factors  movement in any direction    Pain Relieving Factors nothing             [facility] PT Assessment - 01/20/17 0001      Assessment   Medical Diagnosis other spondylosis with radiculopathy, cervical region   Referring Provider Ashok Pall, MD    Onset Date/Surgical Date --  worsened over the last few months   Hand Dominance Right   Next MD Visit none, was out of town and missed his appointment     Prior Therapy not for present issue     Precautions   Precautions None     Balance Screen   Has the patient fallen in the past 6 months No   Has the patient had a decrease in activity level because of a fear of falling?  No   Is the patient reluctant to leave their home because of a fear of falling?  No     Prior Function   Level of Independence Independent;Independent with basic ADLs     Observation/Other Assessments   Observations Pt sitting with shoulders shrugged and UE pressed onto arm rests. No visual signs of distress     Sensation   Light Touch Appears Intact     Posture/Postural Control   Posture/Postural Control Postural limitations   Postural Limitations Rounded Shoulders;Forward head;Increased thoracic kyphosis     AROM   Cervical Flexion 50  pain center of neck   Cervical Extension 30  pain less than flexion    Cervical - Right Side Bend 32   Cervical - Left Side Bend 10  pain Lt side    Cervical - Right Rotation 45   Cervical - Left Rotation 22     PROM   Overall PROM Comments --     Strength   Right Shoulder Flexion 5/5   Right Shoulder ABduction 5/5   Right Shoulder Internal Rotation 5/5   Right Shoulder External Rotation 5/5   Left Shoulder Flexion 5/5   Left Shoulder ABduction 5/5   Left Shoulder Internal Rotation 5/5   Left Shoulder External Rotation 5/5   Right Elbow Flexion 5/5   Right Elbow Extension 5/5   Left Elbow Flexion 5/5   Left Elbow Extension 5/5   Right Wrist Flexion 5/5   Right Wrist Extension 5/5   Left Wrist Flexion 5/5   Left Wrist Extension 5/5   Right Hand Gross Grasp Functional   Right Hand Grip (lbs) 78.3 lb   Left Hand Gross Grasp Functional   Left Hand Grip (lbs) 82     Palpation   Spinal mobility --   Palpation comment --     Special Tests    Special Tests --   Cervical Tests --     Spurling's   Findings --   Comment --     Distraction Test   Findngs --   Comment --                              PT Education - 01/20/17 4098  Education provided Yes   Education Details importance of adhering to full HEP daily; discussed goals and progress or lack of progress towards certain goals; discussed multi-approach with rehab including physical and psychosocial areas     Person(s) Educated Patient   Methods Explanation   Comprehension Verbalized understanding          PT Short Term Goals - 01/20/17 0840      PT SHORT TERM GOAL #1   Title Pt will be independent with HEP and perform consistently in order to maximize return to PLOF and decrease risk of re-injury.   Time 3   Period Weeks   Status Partially Met     PT SHORT TERM GOAL #2   Title Pt will have improved L rotation and L LF by 5 deg in order to maximize driving and decrease pain.    Baseline 3 deg    Time 3   Period Weeks   Status Partially Met     PT SHORT TERM GOAL #3   Title Pt will have improved BUE strength to 5/5 without neck pain in order to maximize ability to perform house and yard work.   Baseline 5/5 MMT BLE   Time 3   Period Weeks   Status Achieved           PT Long Term Goals - 01/20/17 6734      PT LONG TERM GOAL #1   Title Pt will have improved cervical AROM throughout by 15 deg in order to maximize driving and decrease overall pain.    Baseline AROM into flexion   Time 6   Period Weeks   Status Partially Met     PT LONG TERM GOAL #2   Title Pt will report being able to sleep through the night with minimal to no neck pain in order to maximize recovery and promote improved overall well-being.   Baseline unable to sleep through the night  for "many many years"   Time 6   Period Weeks   Status Not Met     PT LONG TERM GOAL #3   Title Pt will report being able to perform 1 hour of yard work with minimal to no neck pain in order to demonstrate improved overall function and maximize participation at home.    Baseline unable    Time 6   Period  Weeks   Status Not Met               Plan - 01/20/17 0854    Clinical Impression Statement Pt was re-evaluated this visit having met 1-2 of his short and long term goals and making progress towards several others. He demonstrates improved cervical flexion AROM and increase in BUE strength, and reports ~10% improvement overall since beginning PT. His grip strength is fairly symmetrical, however Rt hand appears to be slightly less than the Lt. He continues to demonstrate limitations in cervical rotation and lateral flexion to the Lt specifically. He also continues to present with postural limitations and spasm throughout the Lt shoulder/thoracic region. Pt reports consistent HEP adherence, however he was unable to recall several recently added exercises. Although there has been slow progress towards his goals, the pt has only been able to attend ~50% of his initially approved visits due to an out of town trip. He would benefit from 4 more weeks of skilled PT to address his HEP, improve cervical ROM and decrease pain/symptoms with daily activity.    Rehab Potential Fair  PT Frequency 2x / week   PT Duration 6 weeks   PT Treatment/Interventions ADLs/Self Care Home Management;Cryotherapy;Moist Heat;DME Instruction;Gait training;Stair training;Functional mobility training;Therapeutic activities;Therapeutic exercise;Balance training;Neuromuscular re-education;Patient/family education;Manual techniques;Passive range of motion;Dry needling;Taping   PT Next Visit Plan need to update pt HEP and only focus on 3-4 exercises at a time; cervical mobilizations (lateral glides/rotation); cervical rotation stretch with towel.    PT Home Exercise Plan Eval: supine cervical retractions, seated cervical rotation with towel; 6/12: sleep with towel roll in pillow; 12/22/16 pec stretch    Consulted and Agree with Plan of Care Patient      Patient will benefit from skilled therapeutic intervention in order to improve  the following deficits and impairments:  Decreased range of motion, Decreased strength, Hypomobility, Increased fascial restricitons, Increased muscle spasms, Improper body mechanics, Postural dysfunction, Pain  Visit Diagnosis: Cervicalgia  Radiculopathy, cervical region  Other symptoms and signs involving the musculoskeletal system  Abnormal posture     Problem List Patient Active Problem List   Diagnosis Date Noted  . Depression, recurrent ([place]) 09/20/2016  . Insomnia 09/20/2016  . Osteoarthritis of spine with radiculopathy, lumbar region 07/02/2016  . Heme positive stool 04/14/2016  . Dysphagia 11/13/2015  . Loose stools 11/13/2015  . Other synovitis and tenosynovitis, unspecified lower leg   . Chronic pain syndrome 06/24/2014  . Abnormal fasting glucose 02/11/2014  . Fibromyalgia 09/29/2013  . Other malaise and fatigue 01/09/2013  . Chest pain 07/17/2012  . Essential hypertension 07/17/2012  . Arthritis 07/17/2012   11:51 AM,01/20/17 Elly Modena PT, DPT [facility] [address] [place], [place], [address] Phone: [phone]   Fax:  [phone]  Name: Vernon Fisher MRN: [id number] Date of Birth: 1956/04/25

## 2017-01-24 ENCOUNTER — Ambulatory Visit (HOSPITAL_COMMUNITY): Payer: BLUE CROSS/BLUE SHIELD

## 2017-01-24 ENCOUNTER — Encounter (HOSPITAL_COMMUNITY): Payer: Self-pay

## 2017-01-24 DIAGNOSIS — R29898 Other symptoms and signs involving the musculoskeletal system: Secondary | ICD-10-CM

## 2017-01-24 DIAGNOSIS — M5412 Radiculopathy, cervical region: Secondary | ICD-10-CM

## 2017-01-24 DIAGNOSIS — M542 Cervicalgia: Secondary | ICD-10-CM | POA: Diagnosis not present

## 2017-01-24 DIAGNOSIS — R293 Abnormal posture: Secondary | ICD-10-CM | POA: Diagnosis not present

## 2017-01-24 NOTE — Patient Instructions (Signed)
  First Rib Self Mobilization  Place a bedsheet or strap over the curve of the neck that is painful. Sit on the long end of the bedsheet with your opposite hip so that the sheet crosses over your back. Make sure your shoulders are relaxed. Pull down on the front of the sheet until a good tension is felt on rib (see picture). Exhale and hold that tension with your hands. Take a deep breath in without allowing the sheet to rise with the breath and exhale. Repeat.  Perform 1x/day, 5-10 reps, holding for 5-10 seconds each   High Pec Stretch  Step into doorway with one foot in front of other. Elbows above shoulders, use hips to push forward into doorway.   Perform 1x/day, 3-5 stretches, holding for 30-60 seconds   Cervical Rotation (with or without OP)  Begin in sitting with head in neutral. Turn head as far as possible to the right or left. Return to midline and repeat. If instructed, retract head before turning. Keep retracted position as you move back to midline.  Perform 1x/day, 2-3 sets of 10 reps to each side

## 2017-01-24 NOTE — Therapy (Signed)
[place], [place], [address] Phone: [phone]   Fax:  [phone]  Physical Therapy Treatment  Patient Details  Name: Vernon Fisher MRN: [id number] Date of Birth: 02-Jan-1956 Referring Provider: Ashok Pall, MD   Encounter Date: 01/24/2017      PT End of Session - 01/24/17 0824    Visit Number 8   Number of Visits 15   Date for PT Re-Evaluation 01/19/17   Authorization Type [employer] other   Authorization Time Period 12/08/16 to 01/19/17; NEW: 01/20/17 to 02/20/17   PT Start Time 0820   PT Stop Time 0900   PT Time Calculation (min) 40 min   Activity Tolerance Patient tolerated treatment well;No increased pain   Behavior During Therapy WFL for tasks assessed/performed      Past Medical History:  Diagnosis Date  . Anxiety   . Chronic pain   . Degenerative disc disease   . Depression   . Diabetes mellitus without complication (HCC)    diet controlled  . Fibromyalgia   . GERD (gastroesophageal reflux disease)   . Heart murmur    told as a child none since  . Hypertension   . Migraine headache   . Plantar fasciitis   . PONV (postoperative nausea and vomiting)   . Reflux     Past Surgical History:  Procedure Laterality Date  . bilateral carpal tunnel release    . BIOPSY  12/23/2015   Procedure: BIOPSY;  Surgeon: Danie Binder, MD;  [facility];  Service: Endoscopy;;  duodenal bx gastric bx esophageal bx  . BIOPSY  05/11/2016   Procedure: BIOPSY;  Surgeon: Danie Binder, MD;  [facility];  Service: Endoscopy;;  . CERVICAL FUSION     plates and screws  . COLONOSCOPY  06/08/2011   Dr. Oneida Alar: normal. Repeat in 10 years  . COLONOSCOPY WITH PROPOFOL N/A 05/11/2016   Procedure: COLONOSCOPY WITH PROPOFOL;  Surgeon: Danie Binder, MD;  [facility];  Service: Endoscopy;  Laterality: N/A;  815  . ESOPHAGOGASTRODUODENOSCOPY (EGD) WITH PROPOFOL N/A 12/23/2015   Dr. Oneida Alar:  moderate Schatzki's ring s/p dilation, small hiatal hernia, benign small bowel biopsies, negative for EOE, mild gastritis  . KNEE ARTHROSCOPY WITH MEDIAL MENISECTOMY Left 09/06/2014   Procedure: KNEE ARTHROSCOPY WITH LIMITED DEBRIDEMENT;  Surgeon: Carole Civil, MD;  Location: [facility];  Service: Orthopedics;  Laterality: Left;  . NOSE SURGERY     repair of fracture and deviated septum from fracture  . Right Rotator Cuff repair    . SAVORY DILATION N/A 12/23/2015   Procedure: SAVORY DILATION;  Surgeon: Danie Binder, MD;  [facility];  Service: Endoscopy;  Laterality: N/A;    There were no vitals filed for this visit.      Subjective Assessment - 01/24/17 0822    Subjective Pt states that he is still in pain. He says "I'm doing what you all are telling me to do but it seems to make it worse, not better." He states that he was doing the wall push-ups and the self soft tissue massage with the ball.   How long can you sit comfortably? unlimited    How long can you stand comfortably? unlimited    How long can you walk comfortably? unlimited    Patient Stated Goals improve ROM, stiffness, strengthening   Currently in Pain? Yes   Pain Score 7    Pain Location Neck  Pain Orientation Left   Pain Descriptors / Indicators Tightness  painful, stiff   Pain Type Chronic pain   Pain Onset More than a month ago   Pain Frequency Constant   Aggravating Factors  moving any direction   Pain Relieving Factors nothing   Effect of Pain on Daily Activities increased                         [facility] Adult PT Treatment/Exercise - 01/24/17 0001      Neck Exercises: Seated   Neck Retraction 5 reps   Neck Retraction Limitations bil, with rotation    Other Seated Exercise first rib self-mobilizations with towel x 8 reps holding for 5-10 seconds     Manual Therapy   Manual Therapy Soft tissue mobilization;Joint mobilization;Myofascial release   Manual therapy comments manual  therapy completed separate other treatments   Joint Mobilization Grade 2-3 CPAs and unilateral PAs C2-7 x 15-30 sec bouts each   Soft tissue mobilization IASTM with green ball to periscapular musculature and thoracic paraspinals; assessed L first rib and pt was tender to palpation   Myofascial Release TrP and cross friction to L levator scap                PT Education - 01/24/17 0901    Education provided Yes   Education Details updated HEP   Person(s) Educated Patient   Methods Explanation;Demonstration;Handout   Comprehension Verbalized understanding;Returned demonstration          PT Short Term Goals - 01/20/17 0840      PT SHORT TERM GOAL #1   Title Pt will be independent with HEP and perform consistently in order to maximize return to PLOF and decrease risk of re-injury.   Time 3   Period Weeks   Status Partially Met     PT SHORT TERM GOAL #2   Title Pt will have improved L rotation and L LF by 5 deg in order to maximize driving and decrease pain.    Baseline 3 deg    Time 3   Period Weeks   Status Partially Met     PT SHORT TERM GOAL #3   Title Pt will have improved BUE strength to 5/5 without neck pain in order to maximize ability to perform house and yard work.   Baseline 5/5 MMT BLE   Time 3   Period Weeks   Status Achieved           PT Long Term Goals - 01/20/17 3762      PT LONG TERM GOAL #1   Title Pt will have improved cervical AROM throughout by 15 deg in order to maximize driving and decrease overall pain.    Baseline AROM into flexion   Time 6   Period Weeks   Status Partially Met     PT LONG TERM GOAL #2   Title Pt will report being able to sleep through the night with minimal to no neck pain in order to maximize recovery and promote improved overall well-being.   Baseline unable to sleep through the night  for "many many years"   Time 6   Period Weeks   Status Not Met     PT LONG TERM GOAL #3   Title Pt will report being able to  perform 1 hour of yard work with minimal to no neck pain in order to demonstrate improved overall function and maximize participation at home.  Baseline unable    Time 6   Period Weeks   Status Not Met               Plan - 01/24/17 0901    Clinical Impression Statement performed manual therapy for majority of session to address cervical spinal mobility and soft tissue restrictions. Pt tolerated all manual therapy well and reported some improvment following. Assessed pt's first rib this date as he reports his LUE went numb while lying prone with arms OH; he was tender to palpation so had pt perform self-mobs with towel. Updated HEP and educated pt to only perform these and we will continue to update them as needed and he verbalized understanding.   Rehab Potential Fair   PT Frequency 2x / week   PT Duration 6 weeks   PT Treatment/Interventions ADLs/Self Care Home Management;Cryotherapy;Moist Heat;DME Instruction;Gait training;Stair training;Functional mobility training;Therapeutic activities;Therapeutic exercise;Balance training;Neuromuscular re-education;Patient/family education;Manual techniques;Passive range of motion;Dry needling;Taping   PT Next Visit Plan need to update pt HEP and only focus on 3-4 exercises at a time; cervical mobilizations (lateral glides/rotation); cervical rotation stretch with towel.    PT Home Exercise Plan seated cervical retraction with rotation, self-first rib mob with towel, pec stretch in doorway   Consulted and Agree with Plan of Care Patient      Patient will benefit from skilled therapeutic intervention in order to improve the following deficits and impairments:  Decreased range of motion, Decreased strength, Hypomobility, Increased fascial restricitons, Increased muscle spasms, Improper body mechanics, Postural dysfunction, Pain  Visit Diagnosis: Cervicalgia  Radiculopathy, cervical region  Other symptoms and signs involving the musculoskeletal  system  Abnormal posture     Problem List Patient Active Problem List   Diagnosis Date Noted  . Depression, recurrent ([place]) 09/20/2016  . Insomnia 09/20/2016  . Osteoarthritis of spine with radiculopathy, lumbar region 07/02/2016  . Heme positive stool 04/14/2016  . Dysphagia 11/13/2015  . Loose stools 11/13/2015  . Other synovitis and tenosynovitis, unspecified lower leg   . Chronic pain syndrome 06/24/2014  . Abnormal fasting glucose 02/11/2014  . Fibromyalgia 09/29/2013  . Other malaise and fatigue 01/09/2013  . Chest pain 07/17/2012  . Essential hypertension 07/17/2012  . Arthritis 07/17/2012      Geraldine Solar PT, DPT  [facility] [address] [place], [place], [address] Phone: [phone]   Fax:  [phone]  Name: Vernon Fisher MRN: [id number] Date of Birth: 05/15/1956

## 2017-01-27 ENCOUNTER — Ambulatory Visit (HOSPITAL_COMMUNITY): Payer: BLUE CROSS/BLUE SHIELD

## 2017-01-27 ENCOUNTER — Encounter (HOSPITAL_COMMUNITY): Payer: Self-pay

## 2017-01-27 DIAGNOSIS — M542 Cervicalgia: Secondary | ICD-10-CM | POA: Diagnosis not present

## 2017-01-27 DIAGNOSIS — M5412 Radiculopathy, cervical region: Secondary | ICD-10-CM | POA: Diagnosis not present

## 2017-01-27 DIAGNOSIS — R29898 Other symptoms and signs involving the musculoskeletal system: Secondary | ICD-10-CM | POA: Diagnosis not present

## 2017-01-27 DIAGNOSIS — R293 Abnormal posture: Secondary | ICD-10-CM

## 2017-01-27 NOTE — Therapy (Signed)
[place], [place], [address] Phone: [phone]   Fax:  [phone]  Physical Therapy Treatment  Patient Details  Name: Vernon Fisher MRN: [id number] Date of Birth: March 23, 1956 Referring Provider: Ashok Pall, MD   Encounter Date: 01/27/2017      PT End of Session - 01/27/17 0819    Visit Number 9   Number of Visits 15   Date for PT Re-Evaluation 01/19/17   Authorization Type BCBS other   Authorization Time Period 12/08/16 to 01/19/17; NEW: 01/20/17 to 02/20/17   PT Start Time 0817   PT Stop Time 0853   PT Time Calculation (min) 36 min   Activity Tolerance Patient tolerated treatment well;No increased pain   Behavior During Therapy WFL for tasks assessed/performed      Past Medical History:  Diagnosis Date  . Anxiety   . Chronic pain   . Degenerative disc disease   . Depression   . Diabetes mellitus without complication (HCC)    diet controlled  . Fibromyalgia   . GERD (gastroesophageal reflux disease)   . Heart murmur    told as a child none since  . Hypertension   . Migraine headache   . Plantar fasciitis   . PONV (postoperative nausea and vomiting)   . Reflux     Past Surgical History:  Procedure Laterality Date  . bilateral carpal tunnel release    . BIOPSY  12/23/2015   Procedure: BIOPSY;  Surgeon: Danie Binder, MD;  [facility];  Service: Endoscopy;;  duodenal bx gastric bx esophageal bx  . BIOPSY  05/11/2016   Procedure: BIOPSY;  Surgeon: Danie Binder, MD;  [facility];  Service: Endoscopy;;  . CERVICAL FUSION     plates and screws  . COLONOSCOPY  06/08/2011   Dr. Oneida Alar: normal. Repeat in 10 years  . COLONOSCOPY WITH PROPOFOL N/A 05/11/2016   Procedure: COLONOSCOPY WITH PROPOFOL;  Surgeon: Danie Binder, MD;  [facility];  Service: Endoscopy;  Laterality: N/A;  815  . ESOPHAGOGASTRODUODENOSCOPY (EGD) WITH PROPOFOL N/A 12/23/2015   Dr. Oneida Alar:  moderate Schatzki's ring s/p dilation, small hiatal hernia, benign small bowel biopsies, negative for EOE, mild gastritis  . KNEE ARTHROSCOPY WITH MEDIAL MENISECTOMY Left 09/06/2014   Procedure: KNEE ARTHROSCOPY WITH LIMITED DEBRIDEMENT;  Surgeon: Carole Civil, MD;  Location: [facility];  Service: Orthopedics;  Laterality: Left;  . NOSE SURGERY     repair of fracture and deviated septum from fracture  . Right Rotator Cuff repair    . SAVORY DILATION N/A 12/23/2015   Procedure: SAVORY DILATION;  Surgeon: Danie Binder, MD;  [facility];  Service: Endoscopy;  Laterality: N/A;    There were no vitals filed for this visit.      Subjective Assessment - 01/27/17 0820    Subjective Pt states that "it's going" this morning. He says that his neck is hurting more today but it's the right side today. When asked if the left side is better, he states the right is hurting so bad he doesn't think of the left. He reports compliance with his HEP.   How long can you sit comfortably? unlimited    How long can you stand comfortably? unlimited    How long can you walk comfortably? unlimited    Patient Stated Goals improve ROM, stiffness, strengthening   Currently in Pain? Yes   Pain Score 9  Pain Location Neck   Pain Orientation Right   Pain Descriptors / Indicators --  knot   Pain Type Chronic pain   Pain Onset More than a month ago   Pain Frequency Constant   Aggravating Factors  moving any direction   Pain Relieving Factors nothing   Effect of Pain on Daily Activities increased              [facility] Adult PT Treatment/Exercise - 01/27/17 0001      Manual Therapy   Manual Therapy Soft tissue mobilization   Manual therapy comments manual therapy completed separate other treatments   Soft tissue mobilization efflurage, cross friction to R UT, levator scap, cervical paraspinals and upper throacic paraspinals     Neck Exercises: Stretches   Upper Trapezius Stretch 2 reps;30  seconds  bil, with moist heat   Levator Stretch 2 reps;30 seconds  bil, with moist heat              PT Education - 01/27/17 0855    Education provided Yes   Education Details apply heat to neck, added UT and levator scap stretches with heat   Person(s) Educated Patient   Methods Explanation;Demonstration;Handout   Comprehension Verbalized understanding;Returned demonstration          PT Short Term Goals - 01/20/17 0840      PT SHORT TERM GOAL #1   Title Pt will be independent with HEP and perform consistently in order to maximize return to PLOF and decrease risk of re-injury.   Time 3   Period Weeks   Status Partially Met     PT SHORT TERM GOAL #2   Title Pt will have improved L rotation and L LF by 5 deg in order to maximize driving and decrease pain.    Baseline 3 deg    Time 3   Period Weeks   Status Partially Met     PT SHORT TERM GOAL #3   Title Pt will have improved BUE strength to 5/5 without neck pain in order to maximize ability to perform house and yard work.   Baseline 5/5 MMT BLE   Time 3   Period Weeks   Status Achieved           PT Long Term Goals - 01/20/17 5059      PT LONG TERM GOAL #1   Title Pt will have improved cervical AROM throughout by 15 deg in order to maximize driving and decrease overall pain.    Baseline AROM into flexion   Time 6   Period Weeks   Status Partially Met     PT LONG TERM GOAL #2   Title Pt will report being able to sleep through the night with minimal to no neck pain in order to maximize recovery and promote improved overall well-being.   Baseline unable to sleep through the night  for "many many years"   Time 6   Period Weeks   Status Not Met     PT LONG TERM GOAL #3   Title Pt will report being able to perform 1 hour of yard work with minimal to no neck pain in order to demonstrate improved overall function and maximize participation at home.    Baseline unable    Time 6   Period Weeks   Status Not  Met               Plan - 01/27/17 0855    Clinical Impression Statement Pt  presenting with increased neck pain this date at 9/10 on the R side of his neck, compared to last session when it was on the L side of his neck. Performed STM to R UT, levator scap, SCM, cervical and thoracic paraspinals for majority of session to decrease restrictions and pain. pt reported some relief following. Performed UT and levator scap stretching with heat applied as he had significant soft tissue restrictions of these. Added this to his HEP. Continue POC as planned.   Rehab Potential Fair   PT Frequency 2x / week   PT Duration 6 weeks   PT Treatment/Interventions ADLs/Self Care Home Management;Cryotherapy;Moist Heat;DME Instruction;Gait training;Stair training;Functional mobility training;Therapeutic activities;Therapeutic exercise;Balance training;Neuromuscular re-education;Patient/family education;Manual techniques;Passive range of motion;Dry needling;Taping   PT Next Visit Plan need to update pt HEP and only focus on 3-4 exercises at a time; cervical mobilizations (lateral glides/rotation); cervical rotation stretch with towel. STM to decrease restrictions; trial isometrics, trial neural glides   PT Home Exercise Plan seated cervical retraction with rotation, pec stretch in doorway, UT and levator scap stretching with heat   Consulted and Agree with Plan of Care Patient      Patient will benefit from skilled therapeutic intervention in order to improve the following deficits and impairments:  Decreased range of motion, Decreased strength, Hypomobility, Increased fascial restricitons, Increased muscle spasms, Improper body mechanics, Postural dysfunction, Pain  Visit Diagnosis: Cervicalgia  Radiculopathy, cervical region  Other symptoms and signs involving the musculoskeletal system  Abnormal posture     Problem List Patient Active Problem List   Diagnosis Date Noted  . Depression, recurrent  ([place]) 09/20/2016  . Insomnia 09/20/2016  . Osteoarthritis of spine with radiculopathy, lumbar region 07/02/2016  . Heme positive stool 04/14/2016  . Dysphagia 11/13/2015  . Loose stools 11/13/2015  . Other synovitis and tenosynovitis, unspecified lower leg   . Chronic pain syndrome 06/24/2014  . Abnormal fasting glucose 02/11/2014  . Fibromyalgia 09/29/2013  . Other malaise and fatigue 01/09/2013  . Chest pain 07/17/2012  . Essential hypertension 07/17/2012  . Arthritis 07/17/2012     Geraldine Solar PT, DPT  [facility] [address] [place], [place], [address] Phone: [phone]   Fax:  [phone]  Name: TERRELL OSTRAND MRN: [id number] Date of Birth: 29-Jun-1956

## 2017-01-27 NOTE — Patient Instructions (Signed)
  UPPER TRAP STRETCH - HOLDING CHAIR  While sitting in a chair, hold the seat with one hand and bend your head towards the opposite side for a gentle stretch to the side of the neck.  Perform 1x/day with heat on your neck, 3-5 stretches holding for 30-60 seconds on each side   Levator scap stretch   Begin by sitting straight up, then turn your head to one side and then down towards your armpit. A stretch should be felt in the back of neck down towards the shoulder blades.   Perform 1x/day with heat on your neck, 3-5 stretches holding for 30-60 seconds on each side

## 2017-01-31 ENCOUNTER — Ambulatory Visit (HOSPITAL_COMMUNITY): Payer: BLUE CROSS/BLUE SHIELD

## 2017-01-31 ENCOUNTER — Encounter (HOSPITAL_COMMUNITY): Payer: Self-pay

## 2017-01-31 DIAGNOSIS — R293 Abnormal posture: Secondary | ICD-10-CM | POA: Diagnosis not present

## 2017-01-31 DIAGNOSIS — M5412 Radiculopathy, cervical region: Secondary | ICD-10-CM | POA: Diagnosis not present

## 2017-01-31 DIAGNOSIS — R29898 Other symptoms and signs involving the musculoskeletal system: Secondary | ICD-10-CM | POA: Diagnosis not present

## 2017-01-31 DIAGNOSIS — M542 Cervicalgia: Secondary | ICD-10-CM | POA: Diagnosis not present

## 2017-01-31 NOTE — Therapy (Signed)
[facility] [place], [place], [address] Phone: [phone]   Fax:  [phone]  Physical Therapy Treatment  Patient Details  Name: Vernon Fisher MRN: [id number] Date of Birth: Jul 25, 1955 Referring Provider: Ashok Pall, MD   Encounter Date: 01/31/2017      PT End of Session - 01/31/17 0819    Visit Number 10   Number of Visits 15   Date for PT Re-Evaluation 02/20/17   Authorization Type BCBS other   Authorization Time Period 12/08/16 to 01/19/17; NEW: 01/20/17 to 02/20/17   PT Start Time 0820   PT Stop Time 0859   PT Time Calculation (min) 39 min   Activity Tolerance Patient tolerated treatment well;No increased pain   Behavior During Therapy WFL for tasks assessed/performed      Past Medical History:  Diagnosis Date  . Anxiety   . Chronic pain   . Degenerative disc disease   . Depression   . Diabetes mellitus without complication (HCC)    diet controlled  . Fibromyalgia   . GERD (gastroesophageal reflux disease)   . Heart murmur    told as a child none since  . Hypertension   . Migraine headache   . Plantar fasciitis   . PONV (postoperative nausea and vomiting)   . Reflux     Past Surgical History:  Procedure Laterality Date  . bilateral carpal tunnel release    . BIOPSY  12/23/2015   Procedure: BIOPSY;  Surgeon: Danie Binder, MD;  [facility];  Service: Endoscopy;;  duodenal bx gastric bx esophageal bx  . BIOPSY  05/11/2016   Procedure: BIOPSY;  Surgeon: Danie Binder, MD;  [facility];  Service: Endoscopy;;  . CERVICAL FUSION     plates and screws  . COLONOSCOPY  06/08/2011   Dr. Oneida Alar: normal. Repeat in 10 years  . COLONOSCOPY WITH PROPOFOL N/A 05/11/2016   Procedure: COLONOSCOPY WITH PROPOFOL;  Surgeon: Danie Binder, MD;  [facility];  Service: Endoscopy;  Laterality: N/A;  815  . ESOPHAGOGASTRODUODENOSCOPY (EGD) WITH PROPOFOL N/A 12/23/2015   Dr. Oneida Alar:  moderate Schatzki's ring s/p dilation, small hiatal hernia, benign small bowel biopsies, negative for EOE, mild gastritis  . KNEE ARTHROSCOPY WITH MEDIAL MENISECTOMY Left 09/06/2014   Procedure: KNEE ARTHROSCOPY WITH LIMITED DEBRIDEMENT;  Surgeon: Carole Civil, MD;  Location: [facility];  Service: Orthopedics;  Laterality: Left;  . NOSE SURGERY     repair of fracture and deviated septum from fracture  . Right Rotator Cuff repair    . SAVORY DILATION N/A 12/23/2015   Procedure: SAVORY DILATION;  Surgeon: Danie Binder, MD;  [facility];  Service: Endoscopy;  Laterality: N/A;    There were no vitals filed for this visit.      Subjective Assessment - 01/31/17 0820    Subjective Pt states that he spent the whole weekend helping someone move. He states that his neck is really hurting after that. He states that he did not try using heat over the weekend due to time constraints. He reports that his neck is not feeling good this morning.   How long can you sit comfortably? unlimited    How long can you stand comfortably? unlimited    How long can you walk comfortably? unlimited    Patient Stated Goals improve ROM, stiffness, strengthening   Currently in Pain? Yes   Pain Score 9    Pain Location  Neck   Pain Orientation Left   Pain Descriptors / Indicators Tightness;Constant   Pain Type Chronic pain   Pain Onset More than a month ago   Pain Frequency Constant   Aggravating Factors  moving any direction   Pain Relieving Factors nothing   Effect of Pain on Daily Activities increases                [facility] Adult PT Treatment/Exercise - 01/31/17 0001      Neck Exercises: Seated   Other Seated Exercise band pulls with GTB x 10 reps; bil scap retraction with ER with GTB x 10 reps   Other Seated Exercise bil scap retraction with GTB x 10 reps (maintaining cervical retraction)     Neck Exercises: Supine   Cervical Isometrics Left lateral flexion;Right rotation;Left rotation;10  reps   Cervical Isometrics Limitations 6 sec holds each     Manual Therapy   Manual Therapy Neural Stretch   Manual therapy comments manual therapy completed separate other treatments   Neural Stretch L Median and Radial nerve glides x 8 mins total     Neck Exercises: Stretches   Upper Trapezius Stretch 3 reps;30 seconds  bil with moist heat applied   Levator Stretch 3 reps;30 seconds  bil with moist heat applied               PT Education - 01/31/17 0900    Education provided Yes   Education Details apply heat, continue current HEP   Person(s) Educated Patient   Methods Explanation;Demonstration   Comprehension Verbalized understanding;Returned demonstration          PT Short Term Goals - 01/20/17 0840      PT SHORT TERM GOAL #1   Title Pt will be independent with HEP and perform consistently in order to maximize return to PLOF and decrease risk of re-injury.   Time 3   Period Weeks   Status Partially Met     PT SHORT TERM GOAL #2   Title Pt will have improved L rotation and L LF by 5 deg in order to maximize driving and decrease pain.    Baseline 3 deg    Time 3   Period Weeks   Status Partially Met     PT SHORT TERM GOAL #3   Title Pt will have improved BUE strength to 5/5 without neck pain in order to maximize ability to perform house and yard work.   Baseline 5/5 MMT BLE   Time 3   Period Weeks   Status Achieved           PT Long Term Goals - 01/20/17 0102      PT LONG TERM GOAL #1   Title Pt will have improved cervical AROM throughout by 15 deg in order to maximize driving and decrease overall pain.    Baseline AROM into flexion   Time 6   Period Weeks   Status Partially Met     PT LONG TERM GOAL #2   Title Pt will report being able to sleep through the night with minimal to no neck pain in order to maximize recovery and promote improved overall well-being.   Baseline unable to sleep through the night  for "many many years"   Time 6    Period Weeks   Status Not Met     PT LONG TERM GOAL #3   Title Pt will report being able to perform 1 hour of yard work with minimal to  no neck pain in order to demonstrate improved overall function and maximize participation at home.    Baseline unable    Time 6   Period Weeks   Status Not Met               Plan - 01/31/17 0901    Clinical Impression Statement Pt presented to therapy with c/o increased L sided neck pain this date. He reports he helped move someone over the weekend. Began today's session with stretching while moist heat was applied; pt requiring cues for proper technique throughout. Performed neural glides with pt this date and pt was positive for Radial nerve on the L as he had increased thumb numbness. He reported improved symptoms following flossing and improved symptoms with medial nerve flossing after radial. He was most sensitive to ulnar nerve testing so will reassess this next session. Ended session with seated postural strengthening and pt reported increased LBP. When asked what his neck pain was he stated "we're past pain now, it's just numb." Encouraged pt to try heat at home between now and next session and he verbalized understanding.   Rehab Potential Fair   PT Frequency 2x / week   PT Duration 6 weeks   PT Treatment/Interventions ADLs/Self Care Home Management;Cryotherapy;Moist Heat;DME Instruction;Gait training;Stair training;Functional mobility training;Therapeutic activities;Therapeutic exercise;Balance training;Neuromuscular re-education;Patient/family education;Manual techniques;Passive range of motion;Dry needling;Taping   PT Next Visit Plan need to update pt HEP and only focus on 3-4 exercises at a time; cervical mobilizations (lateral glides/rotation); cervical rotation stretch with towel. STM to decrease restrictions; trial isometrics, assess ulnar neural glides; continue radial and medial   PT Home Exercise Plan seated cervical retraction with  rotation, pec stretch in doorway, UT and levator scap stretching with heat   Consulted and Agree with Plan of Care Patient      Patient will benefit from skilled therapeutic intervention in order to improve the following deficits and impairments:  Decreased range of motion, Decreased strength, Hypomobility, Increased fascial restricitons, Increased muscle spasms, Improper body mechanics, Postural dysfunction, Pain  Visit Diagnosis: Cervicalgia  Radiculopathy, cervical region  Other symptoms and signs involving the musculoskeletal system  Abnormal posture     Problem List Patient Active Problem List   Diagnosis Date Noted  . Depression, recurrent ([place]) 09/20/2016  . Insomnia 09/20/2016  . Osteoarthritis of spine with radiculopathy, lumbar region 07/02/2016  . Heme positive stool 04/14/2016  . Dysphagia 11/13/2015  . Loose stools 11/13/2015  . Other synovitis and tenosynovitis, unspecified lower leg   . Chronic pain syndrome 06/24/2014  . Abnormal fasting glucose 02/11/2014  . Fibromyalgia 09/29/2013  . Other malaise and fatigue 01/09/2013  . Chest pain 07/17/2012  . Essential hypertension 07/17/2012  . Arthritis 07/17/2012     Geraldine Solar PT, DPT  [facility] [address] [place], [place], [address] Phone: [phone]   Fax:  [phone]  Name: Vernon Fisher MRN: [id number] Date of Birth: 05-10-1956

## 2017-03-16 ENCOUNTER — Encounter: Payer: Self-pay | Admitting: Family Medicine

## 2017-03-16 ENCOUNTER — Ambulatory Visit (INDEPENDENT_AMBULATORY_CARE_PROVIDER_SITE_OTHER): Payer: BLUE CROSS/BLUE SHIELD | Admitting: Family Medicine

## 2017-03-16 VITALS — BP 132/86 | Temp 98.2°F | Ht 71.0 in | Wt 225.0 lb

## 2017-03-16 DIAGNOSIS — L03313 Cellulitis of chest wall: Secondary | ICD-10-CM | POA: Diagnosis not present

## 2017-03-16 MED ORDER — DOXYCYCLINE HYCLATE 100 MG PO CAPS: 100.0000 mg | ORAL_CAPSULE | Freq: Two times a day (BID) | ORAL | 0 refills | Status: DC

## 2017-03-16 MED ORDER — TRIAMCINOLONE ACETONIDE 0.1 % EX CREA: TOPICAL_CREAM | CUTANEOUS | 4 refills | Status: DC

## 2017-03-16 NOTE — Progress Notes (Signed)
   Subjective:    Patient ID: Vernon Fisher, male    DOB: May 28, 1956, 61 y.o.   MRN: [id number]  Sinusitis  This is a new problem. Episode onset: last week. Associated symptoms include congestion, coughing, headaches and a sore throat. Pertinent negatives include no ear pain. (Abdominal pain) Treatments tried: otc sinus meds.   Patient also relates soreness on his chest wall redness tenderness he wondered if it is a spider bite no drainage from it no fever chills or sweats does not feel terribly ill   Review of Systems  Constitutional: Negative for activity change and fever.  HENT: Positive for congestion, rhinorrhea and sore throat. Negative for ear pain.   Eyes: Negative for discharge.  Respiratory: Positive for cough. Negative for wheezing.   Cardiovascular: Negative for chest pain.  Neurological: Positive for headaches.       Objective:   Physical Exam  Constitutional: He appears well-developed.  HENT:  Head: Normocephalic.  Mouth/Throat: Oropharynx is clear and moist. No oropharyngeal exudate.  Neck: Normal range of motion.  Cardiovascular: Normal rate, regular rhythm and normal heart sounds.   No murmur heard. Pulmonary/Chest: Effort normal and breath sounds normal. He has no wheezes.  Lymphadenopathy:    He has no cervical adenopathy.  Neurological: He exhibits normal muscle tone.  Skin: Skin is warm and dry.  Nursing note and vitals reviewed.  Patient does have an area on the right side of his chest red and tender painful consistent with MRSA not an abscess at this point  Patient also has some scaling on the bottom of the foot could be dyshidrotic eczema does not appear to be tinea     Assessment & Plan:  Triamcinolone twice a day when necessary for itching in the foot If ongoing trouble follow-up Cellulitis warm compresses frequently Antibiotics prescribed warnings discuss if it turns into an abscess follow-up

## 2017-03-22 ENCOUNTER — Encounter: Payer: Self-pay | Admitting: Family Medicine

## 2017-03-22 ENCOUNTER — Ambulatory Visit (INDEPENDENT_AMBULATORY_CARE_PROVIDER_SITE_OTHER): Payer: BLUE CROSS/BLUE SHIELD | Admitting: Family Medicine

## 2017-03-22 VITALS — Temp 98.4°F | Ht 71.0 in | Wt 224.6 lb

## 2017-03-22 DIAGNOSIS — J329 Chronic sinusitis, unspecified: Secondary | ICD-10-CM | POA: Diagnosis not present

## 2017-03-22 MED ORDER — LEVOFLOXACIN 500 MG PO TABS: 500.0000 mg | ORAL_TABLET | Freq: Every day | ORAL | 0 refills | Status: AC

## 2017-03-22 MED ORDER — BETAMETHASONE DIPROPIONATE AUG 0.05 % EX OINT: TOPICAL_OINTMENT | Freq: Two times a day (BID) | CUTANEOUS | 2 refills | Status: DC

## 2017-03-22 NOTE — Progress Notes (Signed)
   Subjective:    Patient ID: Vernon Fisher, male    DOB: 30-Sep-1955, 61 y.o.   MRN: [id number]  Cough  This is a new problem. Associated symptoms include ear pain, a fever, headaches, nasal congestion, a sore throat and wheezing. Associated symptoms comments: Abdominal pain. Treatments tried: otc meds.    Results for orders placed or performed in visit on 09/20/16  Lipid panel  Result Value Ref Range   Cholesterol, Total 207 (H) 100 - 199 mg/dL   Triglycerides 409 0 - 149 mg/dL   HDL 43 >81 mg/dL   VLDL Cholesterol Cal 21 5 - 40 mg/dL   LDL Calculated 191 (H) 0 - 99 mg/dL   Chol/HDL Ratio 4.8 0.0 - 5.0 ratio units  Hepatic function panel  Result Value Ref Range   Total Protein 7.0 6.0 - 8.5 g/dL   Albumin 4.5 3.5 - 5.5 g/dL   Bilirubin Total 0.5 0.0 - 1.2 mg/dL   Bilirubin, Direct 4.78 0.00 - 0.40 mg/dL   Alkaline Phosphatase 104 39 - 117 IU/L   AST 23 0 - 40 IU/L   ALT 28 0 - 44 IU/L  Basic metabolic panel  Result Value Ref Range   Glucose 117 (H) 65 - 99 mg/dL   BUN 10 6 - 24 mg/dL   Creatinine, Ser 2.95 (L) 0.76 - 1.27 mg/dL   GFR calc non Af Amer 103 >59 mL/min/1.73   GFR calc Af Amer 119 >59 mL/min/1.73   BUN/Creatinine Ratio 14 9 - 20   Sodium 142 134 - 144 mmol/L   Potassium 4.7 3.5 - 5.2 mmol/L   Chloride 101 96 - 106 mmol/L   CO2 28 18 - 29 mmol/L   Calcium 9.2 8.7 - 10.2 mg/dL  PSA  Result Value Ref Range   Prostate Specific Ag, Serum 0.5 0.0 - 4.0 ng/mL    Pt has had infxn six months ago  Review of Systems  Constitutional: Positive for fever.  HENT: Positive for ear pain and sore throat.   Respiratory: Positive for cough and wheezing.   Neurological: Positive for headaches.       Objective:   Physical Exam   Alert, mild malaise. Hydration good Vitals stable. frontal/ maxillary tenderness evident positive nasal congestion. pharynx normal neck supple  lungs clear/no crackles or wheezes. heart regular in rhythm Chest wall infection improved       Assessment & Plan:  Impression rhinosinusitis likely post viral, Resistant to current antibiotic. Though antibiotic is helping chest wall infection not helping the sinus infection. Will add Levaquin discussed with patient. plan antibiotics prescribed. Questions answered. Symptomatic care discussed. warning signs discussed. WSL

## 2017-03-23 ENCOUNTER — Encounter: Payer: BLUE CROSS/BLUE SHIELD | Admitting: Family Medicine

## 2017-05-20 ENCOUNTER — Encounter (HOSPITAL_COMMUNITY): Payer: Self-pay

## 2017-05-20 NOTE — Therapy (Signed)
[facility] [place], [place], [address] Phone: [phone]   Fax:  [phone]  Patient Details  Name: Vernon Fisher MRN: [id number] Date of Birth: Jun 26, 1956 Referring Provider:  No ref. provider found  Encounter Date: 05/20/2017    PHYSICAL THERAPY DISCHARGE SUMMARY  Visits from Start of Care: 10  Current functional level related to goals / functional outcomes: See last treatment note   Remaining deficits: See last treatment note   Education / Equipment: n/a Plan: Patient agrees to discharge.  Patient goals were partially met. Patient is being discharged due to not returning since the last visit.  ?????      Geraldine Solar PT, [facility] [address] [place], [place], [address] Phone: [phone]   Fax:  [phone]

## 2017-07-14 ENCOUNTER — Other Ambulatory Visit: Payer: Self-pay | Admitting: Family Medicine

## 2017-07-15 NOTE — Telephone Encounter (Signed)
One mo only needs chronic o v

## 2017-07-19 DIAGNOSIS — E119 Type 2 diabetes mellitus without complications: Secondary | ICD-10-CM | POA: Diagnosis not present

## 2017-07-19 DIAGNOSIS — H40013 Open angle with borderline findings, low risk, bilateral: Secondary | ICD-10-CM | POA: Diagnosis not present

## 2017-07-19 LAB — HM DIABETES EYE EXAM

## 2017-07-20 ENCOUNTER — Encounter: Payer: Self-pay | Admitting: Family Medicine

## 2017-07-20 ENCOUNTER — Ambulatory Visit: Payer: BLUE CROSS/BLUE SHIELD | Admitting: Family Medicine

## 2017-07-20 VITALS — BP 118/82 | Temp 98.6°F | Ht 71.0 in | Wt 221.0 lb

## 2017-07-20 DIAGNOSIS — J329 Chronic sinusitis, unspecified: Secondary | ICD-10-CM | POA: Diagnosis not present

## 2017-07-20 DIAGNOSIS — J310 Chronic rhinitis: Secondary | ICD-10-CM

## 2017-07-20 MED ORDER — HYDROCODONE-HOMATROPINE 5-1.5 MG/5ML PO SYRP: ORAL_SOLUTION | ORAL | 0 refills | Status: DC

## 2017-07-20 MED ORDER — LEVOFLOXACIN 500 MG PO TABS: ORAL_TABLET | ORAL | 0 refills | Status: DC

## 2017-07-20 NOTE — Progress Notes (Signed)
   Subjective:    Patient ID: Vernon Fisher, male    DOB: [birth year], [age] y.o.   MRN: [id number]  HPI Patient is here today with complaints of headaches(headache is on the left side for five weeks now),productive cough,sinus pressure and congestion, left ear pain,sore throat,runny nose.He has been taking sinus medication and advil which have not been helpful.   Hit hard last night  Severe head paon and cpoughing   Cough hit two nights ago, got really bad  No fever     got a flu shot  Pos achey and dim enrgy '  Coughed up some  Blood came a up a bit a few days ago  nons smoker      Review of Systems No headache, no major weight loss or weight gain, no chest pain no back pain abdominal pain no change in bowel habits complete ROS otherwise negative     Objective:   Physical Exam Alert, mild malaise. Hydration good Vitals stable. frontal/ maxillary tenderness evident positive nasal congestion. pharynx normal neck supple  lungs clear/no crackles or wheezes. heart regular in rhythm        Assessment & Plan:  Impression rhinosinusitis likely post viral, discussed with patient. plan antibiotics prescribed. Questions answered. Symptomatic care discussed. warning signs discussed. WSL

## 2017-07-26 ENCOUNTER — Encounter: Payer: Self-pay | Admitting: *Deleted

## 2017-08-03 ENCOUNTER — Ambulatory Visit: Payer: BLUE CROSS/BLUE SHIELD | Admitting: Family Medicine

## 2017-08-03 ENCOUNTER — Encounter: Payer: Self-pay | Admitting: Family Medicine

## 2017-08-03 VITALS — BP 130/82 | Ht 71.0 in | Wt 218.0 lb

## 2017-08-03 DIAGNOSIS — Z0000 Encounter for general adult medical examination without abnormal findings: Secondary | ICD-10-CM

## 2017-08-03 DIAGNOSIS — F339 Major depressive disorder, recurrent, unspecified: Secondary | ICD-10-CM

## 2017-08-03 DIAGNOSIS — R7301 Impaired fasting glucose: Secondary | ICD-10-CM | POA: Diagnosis not present

## 2017-08-03 DIAGNOSIS — Z13220 Encounter for screening for lipoid disorders: Secondary | ICD-10-CM | POA: Diagnosis not present

## 2017-08-03 DIAGNOSIS — Z79899 Other long term (current) drug therapy: Secondary | ICD-10-CM | POA: Diagnosis not present

## 2017-08-03 DIAGNOSIS — Z125 Encounter for screening for malignant neoplasm of prostate: Secondary | ICD-10-CM | POA: Diagnosis not present

## 2017-08-03 DIAGNOSIS — Z0001 Encounter for general adult medical examination with abnormal findings: Secondary | ICD-10-CM

## 2017-08-03 DIAGNOSIS — I10 Essential (primary) hypertension: Secondary | ICD-10-CM | POA: Diagnosis not present

## 2017-08-03 MED ORDER — ETODOLAC 400 MG PO TABS: 400.0000 mg | ORAL_TABLET | Freq: Two times a day (BID) | ORAL | 0 refills | Status: AC | PRN

## 2017-08-03 MED ORDER — ENALAPRIL MALEATE 10 MG PO TABS: 10.0000 mg | ORAL_TABLET | Freq: Every day | ORAL | 5 refills | Status: DC

## 2017-08-03 MED ORDER — ESCITALOPRAM OXALATE 10 MG PO TABS: 10.0000 mg | ORAL_TABLET | Freq: Every day | ORAL | 5 refills | Status: AC

## 2017-08-03 MED ORDER — PANTOPRAZOLE SODIUM 40 MG PO TBEC: 40.0000 mg | DELAYED_RELEASE_TABLET | Freq: Every day | ORAL | 5 refills | Status: AC

## 2017-08-03 NOTE — Progress Notes (Signed)
   Subjective:    Patient ID: Vernon Fisher, male    DOB: Dec 19, 1955, 62 y.o.   MRN: [id number]  HPI The patient comes in today for a wellness visit.  Exercise not good, not so good since [place] colder[place]    A review of their health history was completed.  A review of medications was also completed.  Any needed refills; yes  Eating habits: Good  Falls/  MVA accidents in past few months: No  Regular exercise: Yes walks  Specialist pt sees on regular basis: No  Preventative health issues were discussed.   Additional concerns: legs and knees are acting up/hurting   He is also here to follow up on Htn. He takes Enalapril 10 mg one daily.  Blood pressure medicine and blood pressure levels reviewed today with patient. Compliant with blood pressure medicine. States does not miss a dose. No obvious side effects. Blood pressure generally good when checked elsewhere. Watching salt intake.  Legs aching and hurting both legs, particularly right knee  Patient notes ongoing compliance with antidepressant medication. No obvious side effects. Reports does not miss a dose. Overall continues to help depression substantially. No thoughts of homicide or suicide. Would like to maintain medication.    Review of Systems  Constitutional: Negative for activity change, appetite change and fever.  HENT: Negative for congestion and rhinorrhea.   Eyes: Negative for discharge.  Respiratory: Negative for cough and wheezing.   Cardiovascular: Negative for chest pain.  Gastrointestinal: Negative for abdominal pain, blood in stool and vomiting.  Genitourinary: Negative for difficulty urinating and frequency.  Musculoskeletal: Negative for neck pain.  Skin: Negative for rash.  Allergic/Immunologic: Negative for environmental allergies and food allergies.  Neurological: Negative for weakness and headaches.  Psychiatric/Behavioral: Negative for  agitation.  All other systems reviewed and are negative.      Objective:   Physical Exam  Constitutional: He appears well-developed and well-nourished.  HENT:  Head: Normocephalic and atraumatic.  Right Ear: External ear normal.  Left Ear: External ear normal.  Nose: Nose normal.  Mouth/Throat: Oropharynx is clear and moist.  Eyes: Right eye exhibits no discharge. Left eye exhibits no discharge. No scleral icterus.  Neck: Normal range of motion. Neck supple. No thyromegaly present.  Cardiovascular: Normal rate, regular rhythm and normal heart sounds.  No murmur heard. Pulmonary/Chest: Effort normal and breath sounds normal. No respiratory distress. He has no wheezes.  Abdominal: Soft. Bowel sounds are normal. He exhibits no distension and no mass. There is no tenderness.  Genitourinary: Penis normal.  Musculoskeletal: Normal range of motion. He exhibits no edema.  Right knee crepitations  Lymphadenopathy:    He has no cervical adenopathy.  Neurological: He is alert. He exhibits normal muscle tone. Coordination normal.  Skin: Skin is warm and dry. No erythema.  Psychiatric: He has a normal mood and affect. His behavior is normal. Judgment normal.  Vitals reviewed.         Assessment & Plan:  #1 wellness exam.  Diet discussed.  Exercise discussed.  Vaccines discussed.  Up-to-date on colonoscopy.  Blood work reviewed  2.  Hypertension good control discussed to maintain same dose of meds  3.  Chronic depression clinically stable patient feels benefiting from meds will refill  4.  Right knee pain likely element of osteoarthritis discussed Lodine to use as needed during more severe flare  Follow-up in 6 months

## 2017-08-08 DIAGNOSIS — Z13220 Encounter for screening for lipoid disorders: Secondary | ICD-10-CM | POA: Diagnosis not present

## 2017-08-08 DIAGNOSIS — R7301 Impaired fasting glucose: Secondary | ICD-10-CM | POA: Diagnosis not present

## 2017-08-08 DIAGNOSIS — Z79899 Other long term (current) drug therapy: Secondary | ICD-10-CM | POA: Diagnosis not present

## 2017-08-08 DIAGNOSIS — Z125 Encounter for screening for malignant neoplasm of prostate: Secondary | ICD-10-CM | POA: Diagnosis not present

## 2017-08-09 LAB — LIPID PANEL
CHOL/HDL RATIO: 4.4 ratio (ref 0.0–5.0)
Cholesterol, Total: 169 mg/dL (ref 100–199)
HDL: 38 mg/dL — AB (ref >39)
LDL Calculated: 112 mg/dL — ABNORMAL HIGH (ref 0–99)
TRIGLYCERIDES: 94 mg/dL (ref 0–149)
VLDL CHOLESTEROL CAL: 19 mg/dL (ref 5–40)

## 2017-08-09 LAB — BASIC METABOLIC PANEL
BUN / CREAT RATIO: 8 — AB (ref 9–20)
BUN: 6 mg/dL (ref 6–24)
CHLORIDE: 104 mmol/L (ref 96–106)
CO2: 26 mmol/L (ref 20–29)
Calcium: 8.9 mg/dL (ref 8.7–10.2)
Creatinine, Ser: 0.8 mg/dL (ref 0.76–1.27)
GFR calc Af Amer: 113 mL/min/{1.73_m2} (ref >59)
GFR calc non Af Amer: 98 mL/min/{1.73_m2} (ref >59)
GLUCOSE: 112 mg/dL — AB (ref 65–99)
Potassium: 3.8 mmol/L (ref 3.5–5.2)
SODIUM: 144 mmol/L (ref 134–144)

## 2017-08-09 LAB — HEPATIC FUNCTION PANEL
ALK PHOS: 110 IU/L (ref 39–117)
ALT: 34 IU/L (ref 0–44)
AST: 28 IU/L (ref 0–40)
Albumin: 4.4 g/dL (ref 3.5–5.5)
BILIRUBIN, DIRECT: 0.32 mg/dL (ref 0.00–0.40)
Bilirubin Total: 1.1 mg/dL (ref 0.0–1.2)
Total Protein: 7 g/dL (ref 6.0–8.5)

## 2017-08-09 LAB — PSA: PROSTATE SPECIFIC AG, SERUM: 0.8 ng/mL (ref 0.0–4.0)

## 2017-08-11 ENCOUNTER — Encounter: Payer: Self-pay | Admitting: Family Medicine

## 2017-09-07 ENCOUNTER — Ambulatory Visit: Payer: BLUE CROSS/BLUE SHIELD | Admitting: Family Medicine

## 2017-09-07 ENCOUNTER — Encounter: Payer: Self-pay | Admitting: Nurse Practitioner

## 2017-09-07 ENCOUNTER — Ambulatory Visit: Payer: BLUE CROSS/BLUE SHIELD | Admitting: Nurse Practitioner

## 2017-09-07 VITALS — BP 132/80 | Temp 98.2°F | Ht 71.0 in | Wt 218.0 lb

## 2017-09-07 DIAGNOSIS — J209 Acute bronchitis, unspecified: Secondary | ICD-10-CM

## 2017-09-07 DIAGNOSIS — J019 Acute sinusitis, unspecified: Secondary | ICD-10-CM

## 2017-09-07 DIAGNOSIS — B9689 Other specified bacterial agents as the cause of diseases classified elsewhere: Secondary | ICD-10-CM

## 2017-09-07 MED ORDER — HYDROCODONE-HOMATROPINE 5-1.5 MG/5ML PO SYRP: 5.0000 mL | ORAL_SOLUTION | ORAL | 0 refills | Status: AC | PRN

## 2017-09-07 MED ORDER — AZITHROMYCIN 250 MG PO TABS: ORAL_TABLET | ORAL | 0 refills | Status: AC

## 2017-09-07 MED ORDER — PREDNISONE 20 MG PO TABS: ORAL_TABLET | ORAL | 0 refills | Status: AC

## 2017-09-07 NOTE — Progress Notes (Signed)
Subjective: Presents for complaints of cough and congestion over the past week, worse over the past 3 days.  Some fever and chills initially, this has resolved.  Sore throat.  Frontal area headache.  Runny nose.  Frequent cough now producing green mucus.  Right ear pain.  Possible slight wheezing only with prolonged cough.  No vomiting diarrhea or abdominal pain.  No chest pain or shortness of breath.  Non-smoker.  Objective:   BP 132/80   Temp 98.2 F (36.8 C) (Oral)   Ht 5\' 11"  (1.803 m)   Wt 218 lb (98.9 kg)   BMI 30.40 kg/m  NAD.  Alert, oriented.  TMs retracted bilateral, no erythema.  Posterior pharynx mildly erythematous with PND noted.  Neck supple with mild soft anterior adenopathy.  Lungs scattered faint expiratory crackles noted throughout lung fields.  No wheezing or tachypnea.  Heart regular rate and rhythm.  Assessment:  Acute bacterial sinusitis  Acute bronchitis, unspecified organism    Plan:   Meds ordered this encounter  Medications  . azithromycin (ZITHROMAX Z-PAK) 250 MG tablet    Sig: Take 2 tablets (500 mg) on  Day 1,  followed by 1 tablet (250 mg) once daily on Days 2 through 5.    Dispense:  6 each    Refill:  0    Order Specific Question:   Supervising Provider    Answer:   Merlyn AlbertLUKING, WILLIAM S [[id number]]  . predniSONE (DELTASONE) 20 MG tablet    Sig: 3 po qd x 3 d then 2 po qd x 3 d then 1 po qd x 2 d    Dispense:  17 tablet    Refill:  0    Order Specific Question:   Supervising Provider    Answer:   Merlyn AlbertLUKING, WILLIAM S [[id number]]  . HYDROcodone-homatropine (HYCODAN) 5-1.5 MG/5ML syrup    Sig: Take 5 mLs by mouth every 4 (four) hours as needed.    Dispense:  90 mL    Refill:  0    Order Specific Question:   Supervising Provider    Answer:   Merlyn AlbertLUKING, WILLIAM S [[id number]]   Continue OTC meds as directed for congestion and cough during the daytime.  Warning signs reviewed.  Call back in 4-5 days if no improvement, sooner if worse.

## 2018-01-30 ENCOUNTER — Ambulatory Visit: Payer: BLUE CROSS/BLUE SHIELD | Admitting: Family Medicine

## 2018-02-08 DIAGNOSIS — R0789 Other chest pain: Secondary | ICD-10-CM | POA: Diagnosis not present

## 2018-02-08 DIAGNOSIS — E119 Type 2 diabetes mellitus without complications: Secondary | ICD-10-CM | POA: Diagnosis not present

## 2018-02-08 DIAGNOSIS — Z79899 Other long term (current) drug therapy: Secondary | ICD-10-CM | POA: Diagnosis not present

## 2018-02-08 DIAGNOSIS — R5383 Other fatigue: Secondary | ICD-10-CM | POA: Diagnosis not present

## 2018-02-08 DIAGNOSIS — B353 Tinea pedis: Secondary | ICD-10-CM | POA: Diagnosis not present

## 2018-02-09 DIAGNOSIS — I10 Essential (primary) hypertension: Secondary | ICD-10-CM | POA: Diagnosis not present

## 2018-02-09 DIAGNOSIS — Z79899 Other long term (current) drug therapy: Secondary | ICD-10-CM | POA: Diagnosis not present

## 2018-02-09 DIAGNOSIS — E119 Type 2 diabetes mellitus without complications: Secondary | ICD-10-CM | POA: Diagnosis not present

## 2018-02-09 DIAGNOSIS — R0789 Other chest pain: Secondary | ICD-10-CM | POA: Diagnosis not present

## 2018-02-16 DIAGNOSIS — R5383 Other fatigue: Secondary | ICD-10-CM | POA: Diagnosis not present

## 2018-02-16 DIAGNOSIS — R7301 Impaired fasting glucose: Secondary | ICD-10-CM | POA: Diagnosis not present

## 2018-02-16 DIAGNOSIS — I10 Essential (primary) hypertension: Secondary | ICD-10-CM | POA: Diagnosis not present

## 2018-02-16 DIAGNOSIS — Z7189 Other specified counseling: Secondary | ICD-10-CM | POA: Diagnosis not present

## 2018-02-22 ENCOUNTER — Other Ambulatory Visit: Payer: Self-pay | Admitting: Family Medicine

## 2018-02-22 NOTE — Telephone Encounter (Signed)
im pretty sure pt has moveed records [place] [facility] needs to work with them

## 2018-03-15 DIAGNOSIS — G4733 Obstructive sleep apnea (adult) (pediatric): Secondary | ICD-10-CM | POA: Diagnosis not present

## 2018-03-22 DIAGNOSIS — E119 Type 2 diabetes mellitus without complications: Secondary | ICD-10-CM | POA: Diagnosis not present

## 2018-03-22 DIAGNOSIS — H40003 Preglaucoma, unspecified, bilateral: Secondary | ICD-10-CM | POA: Diagnosis not present

## 2018-03-22 DIAGNOSIS — H2513 Age-related nuclear cataract, bilateral: Secondary | ICD-10-CM | POA: Diagnosis not present

## 2018-03-22 DIAGNOSIS — H16223 Keratoconjunctivitis sicca, not specified as Sjogren's, bilateral: Secondary | ICD-10-CM | POA: Diagnosis not present

## 2018-03-22 DIAGNOSIS — H524 Presbyopia: Secondary | ICD-10-CM | POA: Diagnosis not present

## 2018-04-06 DIAGNOSIS — H40003 Preglaucoma, unspecified, bilateral: Secondary | ICD-10-CM | POA: Diagnosis not present

## 2018-04-23 ENCOUNTER — Other Ambulatory Visit: Payer: Self-pay | Admitting: Family Medicine

## 2018-05-04 DIAGNOSIS — Z23 Encounter for immunization: Secondary | ICD-10-CM | POA: Diagnosis not present

## 2018-05-04 DIAGNOSIS — R079 Chest pain, unspecified: Secondary | ICD-10-CM | POA: Diagnosis not present

## 2018-05-04 DIAGNOSIS — I10 Essential (primary) hypertension: Secondary | ICD-10-CM | POA: Diagnosis not present

## 2018-05-04 DIAGNOSIS — D751 Secondary polycythemia: Secondary | ICD-10-CM | POA: Diagnosis not present

## 2018-05-04 DIAGNOSIS — R5383 Other fatigue: Secondary | ICD-10-CM | POA: Diagnosis not present

## 2018-05-09 DIAGNOSIS — R0789 Other chest pain: Secondary | ICD-10-CM | POA: Diagnosis not present

## 2018-05-09 DIAGNOSIS — R079 Chest pain, unspecified: Secondary | ICD-10-CM | POA: Diagnosis not present

## 2018-05-16 DIAGNOSIS — Z01812 Encounter for preprocedural laboratory examination: Secondary | ICD-10-CM | POA: Diagnosis not present

## 2018-05-17 DIAGNOSIS — I77810 Thoracic aortic ectasia: Secondary | ICD-10-CM | POA: Diagnosis not present

## 2018-05-17 DIAGNOSIS — I2510 Atherosclerotic heart disease of native coronary artery without angina pectoris: Secondary | ICD-10-CM | POA: Diagnosis not present

## 2018-05-17 DIAGNOSIS — Z01812 Encounter for preprocedural laboratory examination: Secondary | ICD-10-CM | POA: Diagnosis not present

## 2018-05-18 DIAGNOSIS — S134XXA Sprain of ligaments of cervical spine, initial encounter: Secondary | ICD-10-CM | POA: Diagnosis not present

## 2018-05-18 DIAGNOSIS — S161XXA Strain of muscle, fascia and tendon at neck level, initial encounter: Secondary | ICD-10-CM | POA: Diagnosis not present

## 2018-05-18 DIAGNOSIS — Y92410 Unspecified street and highway as the place of occurrence of the external cause: Secondary | ICD-10-CM | POA: Diagnosis not present

## 2018-05-18 DIAGNOSIS — S299XXA Unspecified injury of thorax, initial encounter: Secondary | ICD-10-CM | POA: Diagnosis not present

## 2018-05-18 DIAGNOSIS — Z87891 Personal history of nicotine dependence: Secondary | ICD-10-CM | POA: Diagnosis not present

## 2018-05-18 DIAGNOSIS — S3992XA Unspecified injury of lower back, initial encounter: Secondary | ICD-10-CM | POA: Diagnosis not present

## 2018-05-18 DIAGNOSIS — Z4789 Encounter for other orthopedic aftercare: Secondary | ICD-10-CM | POA: Diagnosis not present

## 2018-05-18 DIAGNOSIS — S199XXA Unspecified injury of neck, initial encounter: Secondary | ICD-10-CM | POA: Diagnosis not present

## 2018-05-18 DIAGNOSIS — I10 Essential (primary) hypertension: Secondary | ICD-10-CM | POA: Diagnosis not present

## 2018-05-18 DIAGNOSIS — S3993XA Unspecified injury of pelvis, initial encounter: Secondary | ICD-10-CM | POA: Diagnosis not present

## 2018-05-18 DIAGNOSIS — M4802 Spinal stenosis, cervical region: Secondary | ICD-10-CM | POA: Diagnosis not present

## 2018-05-23 ENCOUNTER — Other Ambulatory Visit: Payer: Self-pay | Admitting: Family Medicine

## 2018-05-29 DIAGNOSIS — I10 Essential (primary) hypertension: Secondary | ICD-10-CM | POA: Diagnosis not present

## 2018-05-29 DIAGNOSIS — G43009 Migraine without aura, not intractable, without status migrainosus: Secondary | ICD-10-CM | POA: Diagnosis not present

## 2018-05-29 DIAGNOSIS — M791 Myalgia, unspecified site: Secondary | ICD-10-CM | POA: Diagnosis not present

## 2018-05-29 DIAGNOSIS — F418 Other specified anxiety disorders: Secondary | ICD-10-CM | POA: Diagnosis not present

## 2018-06-06 DIAGNOSIS — I208 Other forms of angina pectoris: Secondary | ICD-10-CM | POA: Diagnosis not present

## 2018-06-06 DIAGNOSIS — I10 Essential (primary) hypertension: Secondary | ICD-10-CM | POA: Diagnosis not present

## 2018-06-06 DIAGNOSIS — E7849 Other hyperlipidemia: Secondary | ICD-10-CM | POA: Diagnosis not present

## 2018-06-09 DIAGNOSIS — Z209 Contact with and (suspected) exposure to unspecified communicable disease: Secondary | ICD-10-CM | POA: Diagnosis not present

## 2018-06-09 DIAGNOSIS — J0100 Acute maxillary sinusitis, unspecified: Secondary | ICD-10-CM | POA: Diagnosis not present

## 2018-06-09 DIAGNOSIS — Z6832 Body mass index (BMI) 32.0-32.9, adult: Secondary | ICD-10-CM | POA: Diagnosis not present

## 2018-06-12 DIAGNOSIS — I10 Essential (primary) hypertension: Secondary | ICD-10-CM | POA: Diagnosis not present

## 2018-06-12 DIAGNOSIS — I77810 Thoracic aortic ectasia: Secondary | ICD-10-CM | POA: Diagnosis not present

## 2018-06-12 DIAGNOSIS — E119 Type 2 diabetes mellitus without complications: Secondary | ICD-10-CM | POA: Diagnosis not present

## 2018-06-12 DIAGNOSIS — I7389 Other specified peripheral vascular diseases: Secondary | ICD-10-CM | POA: Diagnosis not present

## 2018-06-14 DIAGNOSIS — Z79899 Other long term (current) drug therapy: Secondary | ICD-10-CM | POA: Diagnosis not present

## 2018-06-14 DIAGNOSIS — R5383 Other fatigue: Secondary | ICD-10-CM | POA: Diagnosis not present

## 2018-06-14 DIAGNOSIS — D751 Secondary polycythemia: Secondary | ICD-10-CM | POA: Diagnosis not present

## 2018-06-14 DIAGNOSIS — I10 Essential (primary) hypertension: Secondary | ICD-10-CM | POA: Diagnosis not present

## 2018-06-14 DIAGNOSIS — I208 Other forms of angina pectoris: Secondary | ICD-10-CM | POA: Diagnosis not present

## 2018-06-14 DIAGNOSIS — E7800 Pure hypercholesterolemia, unspecified: Secondary | ICD-10-CM | POA: Diagnosis not present

## 2018-06-14 DIAGNOSIS — R7301 Impaired fasting glucose: Secondary | ICD-10-CM | POA: Diagnosis not present

## 2018-06-14 DIAGNOSIS — Z125 Encounter for screening for malignant neoplasm of prostate: Secondary | ICD-10-CM | POA: Diagnosis not present

## 2018-06-16 DIAGNOSIS — I281 Aneurysm of pulmonary artery: Secondary | ICD-10-CM | POA: Diagnosis not present

## 2018-06-16 DIAGNOSIS — E119 Type 2 diabetes mellitus without complications: Secondary | ICD-10-CM | POA: Diagnosis not present

## 2018-06-16 DIAGNOSIS — Z87891 Personal history of nicotine dependence: Secondary | ICD-10-CM | POA: Diagnosis not present

## 2018-06-16 DIAGNOSIS — I10 Essential (primary) hypertension: Secondary | ICD-10-CM | POA: Diagnosis not present

## 2018-06-16 DIAGNOSIS — E7849 Other hyperlipidemia: Secondary | ICD-10-CM | POA: Diagnosis not present

## 2018-06-16 DIAGNOSIS — I25118 Atherosclerotic heart disease of native coronary artery with other forms of angina pectoris: Secondary | ICD-10-CM | POA: Diagnosis not present

## 2018-06-21 DIAGNOSIS — I739 Peripheral vascular disease, unspecified: Secondary | ICD-10-CM | POA: Diagnosis not present

## 2018-06-21 DIAGNOSIS — J069 Acute upper respiratory infection, unspecified: Secondary | ICD-10-CM | POA: Diagnosis not present

## 2018-06-21 DIAGNOSIS — I208 Other forms of angina pectoris: Secondary | ICD-10-CM | POA: Diagnosis not present

## 2018-06-21 DIAGNOSIS — E785 Hyperlipidemia, unspecified: Secondary | ICD-10-CM | POA: Diagnosis not present

## 2018-06-21 DIAGNOSIS — Z0001 Encounter for general adult medical examination with abnormal findings: Secondary | ICD-10-CM | POA: Diagnosis not present

## 2018-06-21 DIAGNOSIS — I10 Essential (primary) hypertension: Secondary | ICD-10-CM | POA: Diagnosis not present

## 2018-06-21 DIAGNOSIS — R5383 Other fatigue: Secondary | ICD-10-CM | POA: Diagnosis not present

## 2018-06-22 ENCOUNTER — Other Ambulatory Visit: Payer: Self-pay | Admitting: Family Medicine

## 2018-06-22 NOTE — Telephone Encounter (Signed)
[place]

## 2018-06-22 NOTE — Telephone Encounter (Signed)
This med looks like it was prescribed by you,but you are not listed as the Pcp. No Pcp listed.

## 2018-06-22 NOTE — Telephone Encounter (Signed)
Per pt he has moved [place] [facility] and he states he has a new pcp. He is aware he will have to have the new pcp to send in . I refused.

## 2018-07-04 ENCOUNTER — Other Ambulatory Visit: Payer: Self-pay | Admitting: Family Medicine

## 2018-07-14 DIAGNOSIS — G43009 Migraine without aura, not intractable, without status migrainosus: Secondary | ICD-10-CM | POA: Diagnosis not present

## 2018-07-17 DIAGNOSIS — I77810 Thoracic aortic ectasia: Secondary | ICD-10-CM | POA: Diagnosis not present

## 2018-07-17 DIAGNOSIS — E119 Type 2 diabetes mellitus without complications: Secondary | ICD-10-CM | POA: Diagnosis not present

## 2018-07-17 DIAGNOSIS — Z87891 Personal history of nicotine dependence: Secondary | ICD-10-CM | POA: Diagnosis not present

## 2018-07-17 DIAGNOSIS — I7389 Other specified peripheral vascular diseases: Secondary | ICD-10-CM | POA: Diagnosis not present

## 2018-07-17 DIAGNOSIS — I10 Essential (primary) hypertension: Secondary | ICD-10-CM | POA: Diagnosis not present

## 2018-08-08 DIAGNOSIS — Z79899 Other long term (current) drug therapy: Secondary | ICD-10-CM | POA: Diagnosis not present

## 2018-08-08 DIAGNOSIS — G43009 Migraine without aura, not intractable, without status migrainosus: Secondary | ICD-10-CM | POA: Diagnosis not present

## 2018-08-18 DIAGNOSIS — G43909 Migraine, unspecified, not intractable, without status migrainosus: Secondary | ICD-10-CM | POA: Diagnosis not present

## 2018-09-12 DIAGNOSIS — G43009 Migraine without aura, not intractable, without status migrainosus: Secondary | ICD-10-CM | POA: Diagnosis not present

## 2018-09-12 DIAGNOSIS — H9313 Tinnitus, bilateral: Secondary | ICD-10-CM | POA: Diagnosis not present

## 2018-12-13 DIAGNOSIS — R42 Dizziness and giddiness: Secondary | ICD-10-CM | POA: Diagnosis not present

## 2018-12-13 DIAGNOSIS — G43009 Migraine without aura, not intractable, without status migrainosus: Secondary | ICD-10-CM | POA: Diagnosis not present

## 2018-12-13 DIAGNOSIS — H9193 Unspecified hearing loss, bilateral: Secondary | ICD-10-CM | POA: Diagnosis not present

## 2018-12-13 DIAGNOSIS — H9313 Tinnitus, bilateral: Secondary | ICD-10-CM | POA: Diagnosis not present

## 2018-12-19 DIAGNOSIS — I10 Essential (primary) hypertension: Secondary | ICD-10-CM | POA: Diagnosis not present

## 2018-12-19 DIAGNOSIS — E785 Hyperlipidemia, unspecified: Secondary | ICD-10-CM | POA: Diagnosis not present

## 2018-12-19 DIAGNOSIS — Z79899 Other long term (current) drug therapy: Secondary | ICD-10-CM | POA: Diagnosis not present

## 2018-12-19 DIAGNOSIS — E119 Type 2 diabetes mellitus without complications: Secondary | ICD-10-CM | POA: Diagnosis not present

## 2018-12-26 DIAGNOSIS — E785 Hyperlipidemia, unspecified: Secondary | ICD-10-CM | POA: Diagnosis not present

## 2018-12-26 DIAGNOSIS — I10 Essential (primary) hypertension: Secondary | ICD-10-CM | POA: Diagnosis not present

## 2018-12-26 DIAGNOSIS — H9313 Tinnitus, bilateral: Secondary | ICD-10-CM | POA: Diagnosis not present

## 2018-12-26 DIAGNOSIS — F418 Other specified anxiety disorders: Secondary | ICD-10-CM | POA: Diagnosis not present

## 2019-02-13 DIAGNOSIS — L255 Unspecified contact dermatitis due to plants, except food: Secondary | ICD-10-CM | POA: Diagnosis not present

## 2019-02-13 DIAGNOSIS — S91332A Puncture wound without foreign body, left foot, initial encounter: Secondary | ICD-10-CM | POA: Diagnosis not present

## 2019-03-15 DIAGNOSIS — H9193 Unspecified hearing loss, bilateral: Secondary | ICD-10-CM | POA: Diagnosis not present

## 2019-03-15 DIAGNOSIS — G43009 Migraine without aura, not intractable, without status migrainosus: Secondary | ICD-10-CM | POA: Diagnosis not present

## 2019-03-15 DIAGNOSIS — H9313 Tinnitus, bilateral: Secondary | ICD-10-CM | POA: Diagnosis not present

## 2019-03-21 DIAGNOSIS — H903 Sensorineural hearing loss, bilateral: Secondary | ICD-10-CM | POA: Diagnosis not present

## 2019-03-21 DIAGNOSIS — J3089 Other allergic rhinitis: Secondary | ICD-10-CM | POA: Diagnosis not present

## 2019-03-21 DIAGNOSIS — R42 Dizziness and giddiness: Secondary | ICD-10-CM | POA: Diagnosis not present

## 2019-03-21 DIAGNOSIS — H9313 Tinnitus, bilateral: Secondary | ICD-10-CM | POA: Diagnosis not present

## 2019-04-02 DIAGNOSIS — H524 Presbyopia: Secondary | ICD-10-CM | POA: Diagnosis not present

## 2019-04-02 DIAGNOSIS — R51 Headache: Secondary | ICD-10-CM | POA: Diagnosis not present

## 2019-04-02 DIAGNOSIS — E119 Type 2 diabetes mellitus without complications: Secondary | ICD-10-CM | POA: Diagnosis not present

## 2019-04-02 DIAGNOSIS — H16223 Keratoconjunctivitis sicca, not specified as Sjogren's, bilateral: Secondary | ICD-10-CM | POA: Diagnosis not present

## 2019-04-02 DIAGNOSIS — H40003 Preglaucoma, unspecified, bilateral: Secondary | ICD-10-CM | POA: Diagnosis not present

## 2019-04-30 DIAGNOSIS — H40003 Preglaucoma, unspecified, bilateral: Secondary | ICD-10-CM | POA: Diagnosis not present

## 2019-06-19 DIAGNOSIS — I208 Other forms of angina pectoris: Secondary | ICD-10-CM | POA: Diagnosis not present

## 2019-06-19 DIAGNOSIS — E7849 Other hyperlipidemia: Secondary | ICD-10-CM | POA: Diagnosis not present

## 2019-06-19 DIAGNOSIS — Z6831 Body mass index (BMI) 31.0-31.9, adult: Secondary | ICD-10-CM | POA: Diagnosis not present

## 2019-06-19 DIAGNOSIS — I10 Essential (primary) hypertension: Secondary | ICD-10-CM | POA: Diagnosis not present
